# Patient Record
Sex: Male | Born: 1955 | Race: White | Hispanic: No | Smoking: Never smoker
Health system: Southern US, Community
[De-identification: ages and names within clinical notes are randomized; demographics above are authoritative.]

## PROBLEM LIST (undated history)

## (undated) DIAGNOSIS — I213 ST elevation (STEMI) myocardial infarction of unspecified site: Secondary | ICD-10-CM

## (undated) DIAGNOSIS — I519 Heart disease, unspecified: Secondary | ICD-10-CM

## (undated) DIAGNOSIS — I10 Essential (primary) hypertension: Secondary | ICD-10-CM

## (undated) DIAGNOSIS — I2510 Atherosclerotic heart disease of native coronary artery without angina pectoris: Secondary | ICD-10-CM

## (undated) DIAGNOSIS — M1990 Unspecified osteoarthritis, unspecified site: Secondary | ICD-10-CM

## (undated) HISTORY — DX: Atherosclerotic heart disease of native coronary artery without angina pectoris: I25.10

## (undated) HISTORY — PX: JOINT REPLACEMENT: SHX530

## (undated) HISTORY — DX: Heart disease, unspecified: I51.9

## (undated) HISTORY — PX: REPAIR ANKLE LIGAMENT: SUR1187

## (undated) HISTORY — PX: HERNIA REPAIR: SHX51

## (undated) HISTORY — PX: HIP RESURFACING: SHX1760

## (undated) HISTORY — DX: ST elevation (STEMI) myocardial infarction of unspecified site: I21.3

## (undated) HISTORY — PX: TONSILLECTOMY: SUR1361

---

## 2000-09-06 ENCOUNTER — Encounter: Admission: RE | Admit: 2000-09-06 | Discharge: 2000-09-06 | Payer: Self-pay | Admitting: Sports Medicine

## 2000-09-27 ENCOUNTER — Encounter: Admission: RE | Admit: 2000-09-27 | Discharge: 2000-09-27 | Payer: Self-pay | Admitting: Family Medicine

## 2000-09-27 ENCOUNTER — Encounter: Admission: RE | Admit: 2000-09-27 | Discharge: 2000-09-27 | Payer: Self-pay | Admitting: Sports Medicine

## 2002-08-22 ENCOUNTER — Encounter: Admission: RE | Admit: 2002-08-22 | Discharge: 2002-08-22 | Payer: Self-pay | Admitting: Family Medicine

## 2004-10-07 ENCOUNTER — Encounter: Admission: RE | Admit: 2004-10-07 | Discharge: 2004-10-07 | Payer: Self-pay | Admitting: Sports Medicine

## 2004-10-07 ENCOUNTER — Ambulatory Visit: Payer: Self-pay | Admitting: Sports Medicine

## 2004-11-21 ENCOUNTER — Ambulatory Visit: Payer: Self-pay | Admitting: Sports Medicine

## 2005-01-02 ENCOUNTER — Ambulatory Visit: Payer: Self-pay | Admitting: Sports Medicine

## 2005-02-13 ENCOUNTER — Ambulatory Visit: Payer: Self-pay | Admitting: Sports Medicine

## 2005-02-24 ENCOUNTER — Encounter: Admission: RE | Admit: 2005-02-24 | Discharge: 2005-02-24 | Payer: Self-pay | Admitting: Sports Medicine

## 2005-04-27 ENCOUNTER — Encounter: Admission: RE | Admit: 2005-04-27 | Discharge: 2005-04-27 | Payer: Self-pay | Admitting: Orthopedic Surgery

## 2005-07-01 ENCOUNTER — Ambulatory Visit: Payer: Self-pay | Admitting: Family Medicine

## 2005-07-31 ENCOUNTER — Ambulatory Visit: Payer: Self-pay | Admitting: Sports Medicine

## 2005-08-21 ENCOUNTER — Encounter: Admission: RE | Admit: 2005-08-21 | Discharge: 2005-08-21 | Payer: Self-pay | Admitting: Orthopedic Surgery

## 2005-11-05 ENCOUNTER — Ambulatory Visit: Payer: Self-pay | Admitting: Sports Medicine

## 2005-12-10 ENCOUNTER — Ambulatory Visit: Payer: Self-pay | Admitting: Sports Medicine

## 2005-12-14 ENCOUNTER — Encounter: Admission: RE | Admit: 2005-12-14 | Discharge: 2005-12-14 | Payer: Self-pay | Admitting: Orthopedic Surgery

## 2006-06-03 ENCOUNTER — Encounter: Admission: RE | Admit: 2006-06-03 | Discharge: 2006-06-03 | Payer: Self-pay | Admitting: Orthopedic Surgery

## 2006-06-09 IMAGING — CR DG PELVIS 1-2V
1 series · 1 of 1 positions shown · non-contrast
Comparison: none

CLINICAL DATA: Right hip pain for several months, no known injury. 
 PELVIS ONE VIEW: 
 There is no evidence of fracture or diastasis.  No other significant bone or soft tissue abnormalities are identified.

[view not recorded]
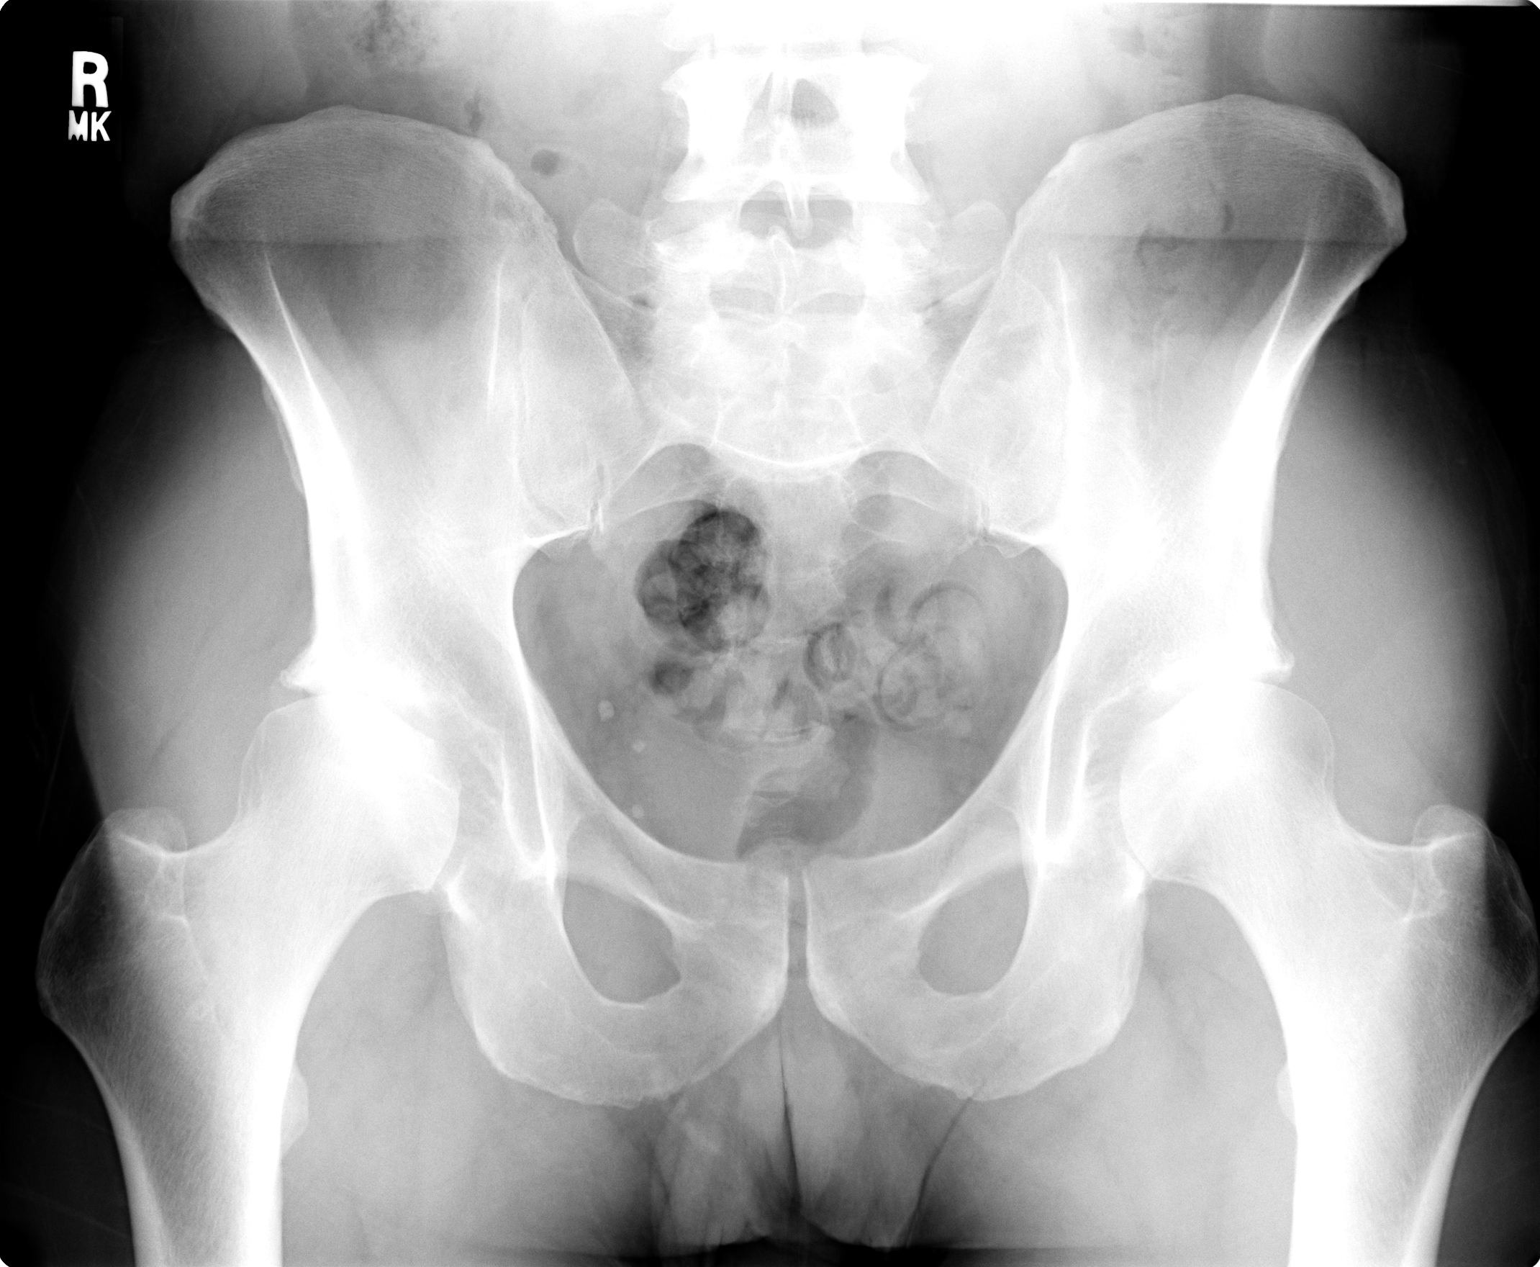

[1 of 1 positions shown; findings below may reference images not displayed]

IMPRESSION: Normal study.

## 2006-10-26 ENCOUNTER — Encounter: Admission: RE | Admit: 2006-10-26 | Discharge: 2006-10-26 | Payer: Self-pay | Admitting: Sports Medicine

## 2006-10-26 ENCOUNTER — Ambulatory Visit: Payer: Self-pay | Admitting: Family Medicine

## 2006-10-26 ENCOUNTER — Ambulatory Visit (HOSPITAL_COMMUNITY): Admission: RE | Admit: 2006-10-26 | Discharge: 2006-10-26 | Payer: Self-pay | Admitting: Family Medicine

## 2007-08-30 ENCOUNTER — Ambulatory Visit: Payer: Self-pay | Admitting: Sports Medicine

## 2007-08-30 DIAGNOSIS — M2170 Unequal limb length (acquired), unspecified site: Secondary | ICD-10-CM | POA: Insufficient documentation

## 2007-08-30 DIAGNOSIS — M79609 Pain in unspecified limb: Secondary | ICD-10-CM | POA: Insufficient documentation

## 2008-02-28 ENCOUNTER — Ambulatory Visit: Payer: Self-pay | Admitting: Sports Medicine

## 2008-04-10 ENCOUNTER — Ambulatory Visit: Payer: Self-pay | Admitting: Sports Medicine

## 2009-04-23 ENCOUNTER — Ambulatory Visit: Payer: Self-pay | Admitting: Sports Medicine

## 2009-04-29 ENCOUNTER — Ambulatory Visit: Payer: Self-pay | Admitting: Sports Medicine

## 2009-04-29 DIAGNOSIS — M1610 Unilateral primary osteoarthritis, unspecified hip: Secondary | ICD-10-CM | POA: Insufficient documentation

## 2009-04-30 ENCOUNTER — Telehealth: Payer: Self-pay | Admitting: Internal Medicine

## 2009-05-07 ENCOUNTER — Ambulatory Visit: Payer: Self-pay | Admitting: Internal Medicine

## 2009-05-16 ENCOUNTER — Ambulatory Visit: Payer: Self-pay | Admitting: Sports Medicine

## 2009-05-16 ENCOUNTER — Ambulatory Visit (HOSPITAL_COMMUNITY): Admission: RE | Admit: 2009-05-16 | Discharge: 2009-05-16 | Payer: Self-pay | Admitting: Sports Medicine

## 2010-10-02 ENCOUNTER — Encounter: Payer: Self-pay | Admitting: Sports Medicine

## 2010-12-14 ENCOUNTER — Encounter: Payer: Self-pay | Admitting: Orthopedic Surgery

## 2013-10-09 ENCOUNTER — Encounter (INDEPENDENT_AMBULATORY_CARE_PROVIDER_SITE_OTHER): Payer: Self-pay

## 2013-10-09 ENCOUNTER — Encounter (INDEPENDENT_AMBULATORY_CARE_PROVIDER_SITE_OTHER): Payer: Self-pay | Admitting: Surgery

## 2013-10-09 ENCOUNTER — Ambulatory Visit (INDEPENDENT_AMBULATORY_CARE_PROVIDER_SITE_OTHER): Payer: 59 | Admitting: Surgery

## 2013-10-09 VITALS — BP 142/84 | HR 60 | Resp 16 | Ht 71.0 in | Wt 188.2 lb

## 2013-10-09 DIAGNOSIS — K4090 Unilateral inguinal hernia, without obstruction or gangrene, not specified as recurrent: Secondary | ICD-10-CM | POA: Insufficient documentation

## 2013-10-09 NOTE — Progress Notes (Signed)
Patient ID: Bradley [name], male   DOB: 03-18-1956, 57 y.o.   MRN: [id number]  Chief Complaint  Patient presents with  . New Evaluation    eval groin pain / possible hernia    HPI Bradley [name] is a 57 y.o. male.   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 He comes in for evaluation of a right inguinal hernia. He noticed a bulge in his right groin after doing heavy lifting this past summer. It is now becoming larger and caused a mild amount of discomfort. He had a previous left inguinal hernia repaired over 30 years ago. He has had no obstructive symptoms. He is otherwise healthy and without complaints History reviewed. No pertinent past medical history.  Past Surgical History  Procedure Laterality Date  . Repair ankle ligament    . Hernia repair      Family History  Problem Relation Age of Onset  . Heart disease Mother   . Cancer Mother     breast  . Heart disease Father     Social History History  Substance Use Topics  . Smoking status: Never Smoker   . Smokeless tobacco: Never Used  . Alcohol Use: Yes     Comment: occ    No Known Allergies  No current outpatient prescriptions on file.   No current facility-administered medications for this visit.    Review of Systems Review of Systems  Constitutional: Negative for fever, chills and unexpected weight change.  HENT: Negative for congestion, hearing loss, sore throat, trouble swallowing and voice change.   Eyes: Negative for visual disturbance.  Respiratory: Negative for cough and wheezing.   Cardiovascular: Negative for chest pain, palpitations and leg swelling.  Gastrointestinal: Negative for nausea, vomiting, abdominal pain, diarrhea, constipation, blood in stool, abdominal distention, anal bleeding and rectal pain.  Genitourinary: Negative for hematuria and difficulty urinating.  Musculoskeletal: Negative for arthralgias.  Skin: Negative for rash and wound.  Neurological: Negative for seizures,  syncope, weakness and headaches.  Hematological: Negative for adenopathy. Does not bruise/bleed easily.  Psychiatric/Behavioral: Negative for confusion.    Blood pressure 142/84, pulse 60, resp. rate 16, height 5\' 11"  (1.803 m), weight 188 lb 3.2 oz (85.367 kg).  Physical Exam Physical Exam  Constitutional: He is oriented to person, place, and time. He appears well-developed and well-nourished. No distress.  HENT:  Head: Normocephalic and atraumatic.  Right Ear: External ear normal.  Left Ear: External ear normal.  Nose: Nose normal.  Mouth/Throat: Oropharynx is clear and moist. No oropharyngeal exudate.  Eyes: Conjunctivae are normal. Pupils are equal, round, and reactive to light. Right eye exhibits no discharge. Left eye exhibits no discharge. No scleral icterus.  Neck: Normal range of motion. Neck supple. No tracheal deviation present. No thyromegaly present.  Cardiovascular: Normal rate, regular rhythm, normal heart sounds and intact distal pulses.   No murmur heard. Pulmonary/Chest: Effort normal and breath sounds normal. No respiratory distress. He has no wheezes.  Abdominal: Soft. Bowel sounds are normal. He exhibits no distension. There is no tenderness. There is no rebound.  Easily reducible right inguinal hernia without evidence of left inguinal hernia  Musculoskeletal: Normal range of motion. He exhibits no edema and no tenderness.  Lymphadenopathy:    He has no cervical adenopathy.  Neurological: He is alert and oriented to person, place, and time.  Skin: Skin is warm and dry. No rash noted. He is not diaphoretic. No erythema.  Psychiatric: His behavior is normal. Judgment  normal.    Data Reviewed    Assessment    Right inguinal hernia     Plan    As it is moderate in size and symptomatic, repair with mesh was recommended. I discussed both the laparoscopic and open techniques. I discussed the risk of surgery which includes but is not limited to bleeding,  infection, recurrence, damage to surrounding structures, nerve entrapment, chronic pain, et Karie Soda. I also discussed postoperative recovery. He understands and wishes to proceed.        Anajah Sterbenz A 10/09/2013, 2:41 PM

## 2013-10-10 ENCOUNTER — Encounter (HOSPITAL_COMMUNITY): Payer: Self-pay

## 2013-10-14 NOTE — Pre-Procedure Instructions (Addendum)
Bradley [name]  10/14/2013   Your procedure is scheduled on:  December 1st, Monday   Report to [facility] Entrance "A" [address] at [employer] AM.   Call this number if you have problems the morning of surgery: [phone]   Remember:   Do not eat food or drink liquids after midnight Sunday.   Take these medicines the morning of surgery with A SIP OF WATER: None     STOP/ Do not take Aspirin, Aleve, Naproxen, Advil, Ibuprofen, Vitamin, Herbs, and Supplements starting November 24   Do not wear jewelry.  Do not wear lotions,powers, or cologne.  May wear deodorant.    Men may shave face and neck.  Do not bring valuables to the hospital.  [facility] is not responsible or any belongings or valuables.               Contacts, dentures or bridgework may not be worn into surgery.  Leave suitcase in the car. After surgery it may be brought to your room.  For patients admitted to the hospital, discharge time is determined by your treatment team.               Patients discharged the day of surgery will not be allowed to drive home.  You will need a responsible adult stay with you for 24 hrs afterwards.    Name and phone number of your driver:  SUE  HUNT    SPOUSE   [phone]   Special Instructions: Shower using CHG 2 nights before surgery and the night before surgery.  If you shower the day of surgery use CHG.  Use special wash - you have one bottle of CHG for all showers.  You should use approximately 1/3 of the bottle for each shower.   Please read over the following fact sheets that you were given: Pain Booklet, Coughing and Deep Breathing and Surgical Site Infection Prevention

## 2013-10-16 ENCOUNTER — Encounter (HOSPITAL_COMMUNITY)
Admission: RE | Admit: 2013-10-16 | Discharge: 2013-10-16 | Disposition: A | Discharge status: Home / Self Care | Payer: 59 | Source: Ambulatory Visit | Attending: Surgery | Admitting: Surgery

## 2013-10-16 ENCOUNTER — Encounter (HOSPITAL_COMMUNITY): Payer: Self-pay

## 2013-10-16 DIAGNOSIS — Z01812 Encounter for preprocedural laboratory examination: Secondary | ICD-10-CM | POA: Insufficient documentation

## 2013-10-16 DIAGNOSIS — Z01818 Encounter for other preprocedural examination: Secondary | ICD-10-CM | POA: Insufficient documentation

## 2013-10-16 LAB — BASIC METABOLIC PANEL
BUN: 14 mg/dL (ref 6–23)
CO2: 24 mEq/L (ref 19–32)
Calcium: 9.1 mg/dL (ref 8.4–10.5)
Chloride: 102 mEq/L (ref 96–112)
Creatinine, Ser: 1 mg/dL (ref 0.50–1.35)
GFR calc Af Amer: >90 mL/min (ref >90)
GFR calc non Af Amer: 80 mL/min — ABNORMAL LOW (ref >90)
Glucose, Bld: 105 mg/dL — ABNORMAL HIGH (ref 70–99)
Potassium: 3.8 mEq/L (ref 3.5–5.1)
Sodium: 137 mEq/L (ref 135–145)

## 2013-10-16 LAB — CBC
HCT: 42.7 % (ref 39.0–52.0)
Hemoglobin: 14.6 g/dL (ref 13.0–17.0)
MCH: 32 pg (ref 26.0–34.0)
MCHC: 34.2 g/dL (ref 30.0–36.0)
MCV: 93.6 fL (ref 78.0–100.0)
Platelets: 172 10*3/uL (ref 150–400)
RBC: 4.56 MIL/uL (ref 4.22–5.81)
RDW: 13.9 % (ref 11.5–15.5)
WBC: 6.8 10*3/uL (ref 4.0–10.5)

## 2013-10-22 MED ORDER — CEFAZOLIN SODIUM-DEXTROSE 2-3 GM-% IV SOLR
2.0000 g | INTRAVENOUS | Status: AC
  Administered 2013-10-23: 2 g via INTRAVENOUS
  Filled 2013-10-22: qty 50

## 2013-10-23 ENCOUNTER — Encounter (HOSPITAL_COMMUNITY): Payer: 59 | Admitting: Anesthesiology

## 2013-10-23 ENCOUNTER — Encounter (HOSPITAL_COMMUNITY)
Admission: RE | Disposition: A | Discharge status: Home / Self Care | Payer: Self-pay | Source: Ambulatory Visit | Attending: Surgery

## 2013-10-23 ENCOUNTER — Encounter (HOSPITAL_COMMUNITY): Payer: Self-pay | Admitting: *Deleted

## 2013-10-23 ENCOUNTER — Ambulatory Visit (HOSPITAL_COMMUNITY): Payer: 59 | Admitting: Anesthesiology

## 2013-10-23 ENCOUNTER — Ambulatory Visit (HOSPITAL_COMMUNITY)
Admission: RE | Admit: 2013-10-23 | Discharge: 2013-10-23 | Disposition: A | Discharge status: Home / Self Care | Payer: 59 | Source: Ambulatory Visit | Attending: Surgery | Admitting: Surgery

## 2013-10-23 DIAGNOSIS — K4090 Unilateral inguinal hernia, without obstruction or gangrene, not specified as recurrent: Secondary | ICD-10-CM | POA: Insufficient documentation

## 2013-10-23 HISTORY — PX: INSERTION OF MESH: SHX5868

## 2013-10-23 HISTORY — PX: INGUINAL HERNIA REPAIR: SHX194

## 2013-10-23 SURGERY — REPAIR, HERNIA, INGUINAL, ADULT
Anesthesia: General | Laterality: Right | Wound class: Clean

## 2013-10-23 MED ORDER — FENTANYL CITRATE 0.05 MG/ML IJ SOLN
INTRAMUSCULAR | Status: DC | PRN
  Administered 2013-10-23: 150 ug via INTRAVENOUS

## 2013-10-23 MED ORDER — KETOROLAC TROMETHAMINE 30 MG/ML IJ SOLN
INTRAMUSCULAR | Status: DC | PRN
  Administered 2013-10-23: 30 mg via INTRAVENOUS

## 2013-10-23 MED ORDER — HYDROMORPHONE HCL PF 1 MG/ML IJ SOLN: 0.2500 mg | INTRAMUSCULAR | Status: DC | PRN

## 2013-10-23 MED ORDER — ONDANSETRON HCL 4 MG/2ML IJ SOLN
INTRAMUSCULAR | Status: DC | PRN
  Administered 2013-10-23: 4 mg via INTRAVENOUS

## 2013-10-23 MED ORDER — MIDAZOLAM HCL 5 MG/5ML IJ SOLN
INTRAMUSCULAR | Status: DC | PRN
  Administered 2013-10-23: 2 mg via INTRAVENOUS

## 2013-10-23 MED ORDER — ONDANSETRON HCL 4 MG/2ML IJ SOLN: 4.0000 mg | Freq: Once | INTRAMUSCULAR | Status: DC | PRN

## 2013-10-23 MED ORDER — PROPOFOL 10 MG/ML IV BOLUS
INTRAVENOUS | Status: DC | PRN
  Administered 2013-10-23: 200 mg via INTRAVENOUS

## 2013-10-23 MED ORDER — 0.9 % SODIUM CHLORIDE (POUR BTL) OPTIME
TOPICAL | Status: DC | PRN
  Administered 2013-10-23: 1000 mL

## 2013-10-23 MED ORDER — BUPIVACAINE-EPINEPHRINE 0.5% -1:200000 IJ SOLN
INTRAMUSCULAR | Status: DC | PRN
  Administered 2013-10-23: 20 mL

## 2013-10-23 MED ORDER — BUPIVACAINE-EPINEPHRINE PF 0.5-1:200000 % IJ SOLN
INTRAMUSCULAR | Status: AC
  Filled 2013-10-23: qty 30

## 2013-10-23 MED ORDER — KETOROLAC TROMETHAMINE 30 MG/ML IJ SOLN: 15.0000 mg | Freq: Once | INTRAMUSCULAR | Status: DC | PRN

## 2013-10-23 MED ORDER — HYDROCODONE-ACETAMINOPHEN 5-325 MG PO TABS: 1.0000 | ORAL_TABLET | ORAL | Status: DC | PRN

## 2013-10-23 MED ORDER — LACTATED RINGERS IV SOLN
INTRAVENOUS | Status: DC | PRN
  Administered 2013-10-23 (×2): via INTRAVENOUS

## 2013-10-23 SURGICAL SUPPLY — 45 items
APL SKNCLS STERI-STRIP NONHPOA (GAUZE/BANDAGES/DRESSINGS) ×1
BENZOIN TINCTURE PRP APPL 2/3 (GAUZE/BANDAGES/DRESSINGS) ×2 IMPLANT
BLADE SURG 10 STRL SS (BLADE) ×2 IMPLANT
BLADE SURG 15 STRL LF DISP TIS (BLADE) ×1 IMPLANT
BLADE SURG 15 STRL SS (BLADE) ×2
BLADE SURG ROTATE 9660 (MISCELLANEOUS) ×1 IMPLANT
CHLORAPREP W/TINT 26ML (MISCELLANEOUS) ×2 IMPLANT
CLSR STERI-STRIP ANTIMIC 1/2X4 (GAUZE/BANDAGES/DRESSINGS) ×1 IMPLANT
COVER SURGICAL LIGHT HANDLE (MISCELLANEOUS) ×2 IMPLANT
DRAIN PENROSE 1/2X12 LTX STRL (WOUND CARE) ×1 IMPLANT
DRAPE LAPAROTOMY TRNSV 102X78 (DRAPE) ×2 IMPLANT
DRESSING TELFA 8X3 (GAUZE/BANDAGES/DRESSINGS) ×2 IMPLANT
DRSG TEGADERM 4X4.75 (GAUZE/BANDAGES/DRESSINGS) ×2 IMPLANT
ELECT CAUTERY BLADE 6.4 (BLADE) ×2 IMPLANT
ELECT REM PT RETURN 9FT ADLT (ELECTROSURGICAL) ×2
ELECTRODE REM PT RTRN 9FT ADLT (ELECTROSURGICAL) ×1 IMPLANT
GLOVE BIOGEL PI IND STRL 6.5 (GLOVE) IMPLANT
GLOVE BIOGEL PI IND STRL 7.0 (GLOVE) IMPLANT
GLOVE BIOGEL PI INDICATOR 6.5 (GLOVE) ×2
GLOVE BIOGEL PI INDICATOR 7.0 (GLOVE) ×1
GLOVE SURG SIGNA 7.5 PF LTX (GLOVE) ×2 IMPLANT
GOWN STRL NON-REIN LRG LVL3 (GOWN DISPOSABLE) ×2 IMPLANT
GOWN STRL REIN XL XLG (GOWN DISPOSABLE) ×2 IMPLANT
KIT BASIN OR (CUSTOM PROCEDURE TRAY) ×2 IMPLANT
KIT ROOM TURNOVER OR (KITS) ×2 IMPLANT
MESH PARIETEX PROGRIP RIGHT (Mesh General) ×1 IMPLANT
NDL HYPO 25GX1X1/2 BEV (NEEDLE) ×1 IMPLANT
NEEDLE HYPO 25GX1X1/2 BEV (NEEDLE) ×2 IMPLANT
NS IRRIG 1000ML POUR BTL (IV SOLUTION) ×2 IMPLANT
PACK SURGICAL SETUP 50X90 (CUSTOM PROCEDURE TRAY) ×2 IMPLANT
PAD ARMBOARD 7.5X6 YLW CONV (MISCELLANEOUS) ×4 IMPLANT
PENCIL BUTTON HOLSTER BLD 10FT (ELECTRODE) ×2 IMPLANT
SPECIMEN JAR SMALL (MISCELLANEOUS) IMPLANT
SPONGE GAUZE 4X4 12PLY (GAUZE/BANDAGES/DRESSINGS) ×1 IMPLANT
SPONGE LAP 18X18 X RAY DECT (DISPOSABLE) ×2 IMPLANT
STRIP CLOSURE SKIN 1/2X4 (GAUZE/BANDAGES/DRESSINGS) ×2 IMPLANT
SUT MON AB 4-0 PC3 18 (SUTURE) ×2 IMPLANT
SUT SILK 2 0 SH (SUTURE) ×1 IMPLANT
SUT VIC AB 2-0 CT1 27 (SUTURE) ×4
SUT VIC AB 2-0 CT1 TAPERPNT 27 (SUTURE) ×2 IMPLANT
SUT VIC AB 3-0 CT1 27 (SUTURE) ×2
SUT VIC AB 3-0 CT1 TAPERPNT 27 (SUTURE) ×1 IMPLANT
SYR CONTROL 10ML LL (SYRINGE) ×2 IMPLANT
TOWEL OR 17X24 6PK STRL BLUE (TOWEL DISPOSABLE) ×2 IMPLANT
TOWEL OR 17X26 10 PK STRL BLUE (TOWEL DISPOSABLE) ×2 IMPLANT

## 2013-10-23 NOTE — Anesthesia Procedure Notes (Signed)
Procedure Name: LMA Insertion Date/Time: 10/23/2013 7:25 AM Performed by: Coralee Rud Pre-anesthesia Checklist: Patient identified Patient Re-evaluated:Patient Re-evaluated prior to inductionOxygen Delivery Method: Circle system utilized Preoxygenation: Pre-oxygenation with 100% oxygen Intubation Type: IV induction Ventilation: Mask ventilation without difficulty LMA: LMA inserted LMA Size: 5.0 Grade View: Grade I Number of attempts: 1

## 2013-10-23 NOTE — Op Note (Signed)
[name], CHING NO.:  [id number]  MEDICAL RECORD NO.:  [id number]  LOCATION:  [facility]                         FACILITY:  [facility]  PHYSICIAN:  Abigail Miyamoto, M.D. DATE OF BIRTH:  [birth year]  DATE OF PROCEDURE:  10/23/2013 DATE OF DISCHARGE:                              OPERATIVE REPORT   PREOPERATIVE DIAGNOSIS:  Right inguinal hernia.  POSTOPERATIVE DIAGNOSIS:  Right inguinal hernia.  PROCEDURE:  Right inguinal hernia repair with mesh.  SURGEON:  Abigail Miyamoto, M.D.  ANESTHESIA:  General and 0.5% Marcaine.  ESTIMATED BLOOD LOSS:  Minimal.  FINDINGS:  The patient was found to have an indirect right inguinal hernia.  It was repaired with a piece of Parietex ProGrip Prolene mesh.  PROCEDURE IN DETAIL:  The patient was brought to the operating room, identified as Lars Pinks.  He was placed supine on the operating room table and general anesthesia was induced.  His abdomen and groins were then prepped and draped in usual sterile fashion.  I performed a right ilioinguinal nerve block with Marcaine.  I anesthetized the skin as well with Marcaine.  I then made a longitudinal incision in the patient's right groin with a scalpel.  I took this down through Scarpa's fascia with the electrocautery.  The external oblique fascia was then identified and opened towards the internal and external rings.  The ilioinguinal nerve was identified and divided.  I removed a segment of the nerve.  I then controlled the testicular cord structures with a Penrose drain.  The inguinal floor was examined, and there was no evidence of direct hernia.  The patient had a large thick-walled hernia sac along the testicular cord.  I was able to separate the cord from the hernia sac.  I then dissected down the sac to the base at the internal ring.  I then tied off the base of the sac with a 2-0 silk suture.  I then excised the redundant sac with the electrocautery.  A piece  of Parietex ProGrip Prolene mesh for the right side was then brought to the field.  I placed it as an onlay on the inguinal floor.  I then brought it around the cord structures.  I then secured it in place with several interrupted 2-0 Vicryl sutures.  Good coverage of the inguinal floor and cord structures appeared to be achieved.  I then closed the external oblique fascia over top of this with running 2-0 Vicryl suture.  I anesthetized the fascia further with Marcaine.  I then closed the Scarpa's fascia with interrupted 3-0 Vicryl sutures and closed the skin with a running 4-0 Monocryl.  Steri-Strips, gauze, and Tegaderm were then applied.  The patient tolerated the procedure well.  All the counts were correct at the end of the procedure.  The patient was then extubated in the operating room and taken in a stable condition to the recovery room.     Abigail Miyamoto, M.D.     DB/MEDQ  D:  10/23/2013  T:  10/23/2013  Job:  [id number]

## 2013-10-23 NOTE — Preoperative (Signed)
Beta Blockers   Reason not to administer Beta Blockers:Not Applicable 

## 2013-10-23 NOTE — Transfer of Care (Signed)
Immediate Anesthesia Transfer of Care Note  Patient: Bradley [name]  Procedure(s) Performed: Procedure(s): HERNIA REPAIR INGUINAL ADULT (Right) INSERTION OF MESH (Right)  Patient Location: PACU  Anesthesia Type:General  Level of Consciousness: awake, oriented and patient cooperative  Airway & Oxygen Therapy: Patient Spontanous Breathing and Patient connected to face mask oxygen  Post-op Assessment: Report given to PACU RN and Post -op Vital signs reviewed and stable  Post vital signs: Reviewed and stable  Complications: No apparent anesthesia complications

## 2013-10-23 NOTE — H&P (Signed)
Patient ID: Bradley [name], male DOB: [birth year], 57 y.o. MRN: [id number]  Chief Complaint   Patient presents with   .  New Evaluation     eval groin pain / possible hernia   HPI  Bradley [name] is a 57 y.o. male.  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 He comes in for evaluation of a right inguinal hernia. He noticed a bulge in his right groin after doing heavy lifting this past summer. It is now becoming larger and caused a mild amount of discomfort. He had a previous left inguinal hernia repaired over 30 years ago. He has had no obstructive symptoms. He is otherwise healthy and without complaints  History reviewed. No pertinent past medical history.  Past Surgical History   Procedure  Laterality  Date   .  Repair ankle ligament     .  Hernia repair      Family History   Problem  Relation  Age of Onset   .  Heart disease  Mother    .  Cancer  Mother      breast   .  Heart disease  Father    Social History  History   Substance Use Topics   .  Smoking status:  Never Smoker   .  Smokeless tobacco:  Never Used   .  Alcohol Use:  Yes      Comment: occ   No Known Allergies  No current outpatient prescriptions on file.    No current facility-administered medications for this visit.   Review of Systems  Review of Systems  Constitutional: Negative for fever, chills and unexpected weight change.  HENT: Negative for congestion, hearing loss, sore throat, trouble swallowing and voice change.  Eyes: Negative for visual disturbance.  Respiratory: Negative for cough and wheezing.  Cardiovascular: Negative for chest pain, palpitations and leg swelling.  Gastrointestinal: Negative for nausea, vomiting, abdominal pain, diarrhea, constipation, blood in stool, abdominal distention, anal bleeding and rectal pain.  Genitourinary: Negative for hematuria and difficulty urinating.  Musculoskeletal: Negative for arthralgias.  Skin: Negative for rash and wound.  Neurological:  Negative for seizures, syncope, weakness and headaches.  Hematological: Negative for adenopathy. Does not bruise/bleed easily.  Psychiatric/Behavioral: Negative for confusion.  Blood pressure 142/84, pulse 60, resp. rate 16, height 5\' 11"  (1.803 m), weight 188 lb 3.2 oz (85.367 kg).  Physical Exam  Physical Exam  Constitutional: He is oriented to person, place, and time. He appears well-developed and well-nourished. No distress.  HENT:  Head: Normocephalic and atraumatic.  Right Ear: External ear normal.  Left Ear: External ear normal.  Nose: Nose normal.  Mouth/Throat: Oropharynx is clear and moist. No oropharyngeal exudate.  Eyes: Conjunctivae are normal. Pupils are equal, round, and reactive to light. Right eye exhibits no discharge. Left eye exhibits no discharge. No scleral icterus.  Neck: Normal range of motion. Neck supple. No tracheal deviation present. No thyromegaly present.  Cardiovascular: Normal rate, regular rhythm, normal heart sounds and intact distal pulses.  No murmur heard.  Pulmonary/Chest: Effort normal and breath sounds normal. No respiratory distress. He has no wheezes.  Abdominal: Soft. Bowel sounds are normal. He exhibits no distension. There is no tenderness. There is no rebound.  Easily reducible right inguinal hernia without evidence of left inguinal hernia  Musculoskeletal: Normal range of motion. He exhibits no edema and no tenderness.  Lymphadenopathy:  He has no cervical adenopathy.  Neurological: He is alert and oriented to person,  place, and time.  Skin: Skin is warm and dry. No rash noted. He is not diaphoretic. No erythema.  Psychiatric: His behavior is normal. Judgment normal.   Assessment  Right inguinal hernia   Plan  As it is moderate in size and symptomatic, repair with mesh was recommended. I discussed both the laparoscopic and open techniques. I discussed the risk of surgery which includes but is not limited to bleeding, infection, recurrence,  damage to surrounding structures, nerve entrapment, chronic pain, et Karie Soda. I also discussed postoperative recovery. He understands and wishes to proceed.

## 2013-10-23 NOTE — Op Note (Signed)
HERNIA REPAIR INGUINAL ADULT, INSERTION OF MESH  Procedure Note  Bradley [name] 10/23/2013   Pre-op Diagnosis: right inguinal hernia      Post-op Diagnosis: same  Procedure(s): HERNIA REPAIR INGUINAL ADULT RIGHT INSERTION OF MESH  Surgeon(s): Shelly Rubenstein, MD  Anesthesia: General  Staff:  Circulator: Gunnar Fusi, RN; Jacqlyn Larsen, RN Scrub Person: [facility] Sparkman, CST  Estimated Blood Loss: Minimal                         Bradley [name]   Date: 10/23/2013  Time: 8:03 AM

## 2013-10-23 NOTE — Anesthesia Preprocedure Evaluation (Signed)
Anesthesia Evaluation  Patient identified by MRN, date of birth, ID band Patient awake    Reviewed: Allergy & Precautions, H&P , NPO status   History of Anesthesia Complications Negative for: history of anesthetic complications  Airway Mallampati: I      Dental  (+) Teeth Intact   Pulmonary neg pulmonary ROS,    Pulmonary exam normal       Cardiovascular negative cardio ROS  Rhythm:Regular Rate:Bradycardia     Neuro/Psych negative neurological ROS  negative psych ROS   GI/Hepatic negative GI ROS, Neg liver ROS,   Endo/Other  negative endocrine ROS  Renal/GU negative Renal ROS     Musculoskeletal negative musculoskeletal ROS (+)   Abdominal Normal abdominal exam  (+)   Peds negative pediatric ROS (+)  Hematology   Anesthesia Other Findings   Reproductive/Obstetrics negative OB ROS                           Anesthesia Physical Anesthesia Plan  ASA: I  Anesthesia Plan: General   Post-op Pain Management:    Induction:   Airway Management Planned: LMA  Additional Equipment:   Intra-op Plan:   Post-operative Plan: Extubation in OR  Informed Consent: I have reviewed the patients History and Physical, chart, labs and discussed the procedure including the risks, benefits and alternatives for the proposed anesthesia with the patient or authorized representative who has indicated his/her understanding and acceptance.   Dental advisory given  Plan Discussed with: CRNA and Surgeon  Anesthesia Plan Comments:         Anesthesia Quick Evaluation

## 2013-10-23 NOTE — Anesthesia Postprocedure Evaluation (Signed)
  Anesthesia Post-op Note  Patient: Bradley [name]  Procedure(s) Performed: Procedure(s): HERNIA REPAIR INGUINAL ADULT (Right) INSERTION OF MESH (Right)  Patient Location: PACU  Anesthesia Type:General  Level of Consciousness: awake, alert  and oriented  Airway and Oxygen Therapy: Patient Spontanous Breathing  Post-op Pain: mild  Post-op Assessment: Post-op Vital signs reviewed, Patient's Cardiovascular Status Stable, Respiratory Function Stable, Patent Airway and Pain level controlled  Post-op Vital Signs: stable  Complications: No apparent anesthesia complications

## 2013-10-25 ENCOUNTER — Encounter (HOSPITAL_COMMUNITY): Payer: Self-pay | Admitting: Surgery

## 2013-11-07 ENCOUNTER — Ambulatory Visit (INDEPENDENT_AMBULATORY_CARE_PROVIDER_SITE_OTHER): Payer: 59 | Admitting: Surgery

## 2013-11-07 ENCOUNTER — Encounter (INDEPENDENT_AMBULATORY_CARE_PROVIDER_SITE_OTHER): Payer: Self-pay | Admitting: Surgery

## 2013-11-07 VITALS — BP 116/80 | HR 60 | Temp 98.0°F | Resp 14 | Ht 71.0 in | Wt 190.4 lb

## 2013-11-07 DIAGNOSIS — Z09 Encounter for follow-up examination after completed treatment for conditions other than malignant neoplasm: Secondary | ICD-10-CM

## 2013-11-07 NOTE — Progress Notes (Signed)
Subjective:     Patient ID: Bradley [name], male   DOB: 05-02-1956, 57 y.o.   MRN: [id number]  HPI He is here for his first postop visit status post right inguinal hernia repair with mesh. He is doing well has no complaints.  Review of Systems     Objective:   Physical Exam On exam, his incision is healing well with no evidence of infection or recurrence    Assessment:     Patient stable postop     Plan:     He will refrain from heavy lifting until December 29. At that time he resumed normal activity. I will see him back as needed

## 2015-04-15 ENCOUNTER — Ambulatory Visit (INDEPENDENT_AMBULATORY_CARE_PROVIDER_SITE_OTHER): Payer: No Typology Code available for payment source | Admitting: Family Medicine

## 2015-04-15 ENCOUNTER — Encounter: Payer: Self-pay | Admitting: Family Medicine

## 2015-04-15 VITALS — BP 123/77 | HR 49 | Temp 98.0°F | Resp 16 | Ht 70.5 in | Wt 183.0 lb

## 2015-04-15 DIAGNOSIS — Z23 Encounter for immunization: Secondary | ICD-10-CM | POA: Diagnosis not present

## 2015-04-15 DIAGNOSIS — Z0000 Encounter for general adult medical examination without abnormal findings: Secondary | ICD-10-CM

## 2015-04-15 DIAGNOSIS — E785 Hyperlipidemia, unspecified: Secondary | ICD-10-CM | POA: Diagnosis not present

## 2015-04-15 DIAGNOSIS — Z1211 Encounter for screening for malignant neoplasm of colon: Secondary | ICD-10-CM | POA: Diagnosis not present

## 2015-04-15 DIAGNOSIS — Z8249 Family history of ischemic heart disease and other diseases of the circulatory system: Secondary | ICD-10-CM | POA: Diagnosis not present

## 2015-04-15 MED ORDER — ZOSTER VACCINE LIVE 19400 UNT/0.65ML ~~LOC~~ SOLR: 0.6500 mL | Freq: Once | SUBCUTANEOUS | Status: DC

## 2015-04-15 NOTE — Patient Instructions (Signed)
I will refer you to gastroenterologist and cardiologist as we discussed.  You may want to obtain an appointment with Dr. Ninfa Linden to evaluate the left sided area for possible hernia.  Aspirin 81mg  each day.  If you decide you would like prostate cancer screening - we can perform that at next visit.  I printed shingles vaccine for your pharmacy if you would like to have this vaccine. See info below.     Keeping you healthy  Get these tests  Blood pressure- Have your blood pressure checked once a year by your healthcare provider.  Normal blood pressure is 120/80  Weight- Have your body mass index (BMI) calculated to screen for obesity.  BMI is a measure of body fat based on height and weight. You can also calculate your own BMI at [email or web address].  Cholesterol- Have your cholesterol checked every year.  Diabetes- Have your blood sugar checked regularly if you have high blood pressure, high cholesterol, have a family history of diabetes or if you are overweight.  Screening for Colon Cancer- Colonoscopy starting at age 21.  Screening may begin sooner depending on your family history and other health conditions. Follow up colonoscopy as directed by your Gastroenterologist.  Screening for Prostate Cancer- Both blood work (PSA) and a rectal exam help screen for Prostate Cancer.  Screening begins at age 67 with African-American men and at age 69 with Caucasian men.  Screening may begin sooner depending on your family history.  Take these medicines  Aspirin- One aspirin daily can help prevent Heart disease and Stroke.  Flu shot- Every fall.  Tetanus- Every 10 years.  Zostavax- Once after the age of 36 to prevent Shingles.  Pneumonia shot- Once after the age of 75; if you are younger than 41, ask your healthcare provider if you need a Pneumonia shot.  Take these steps  Don't smoke- If you do smoke, talk to your doctor about quitting.  For tips on how to quit, go to  www.smokefree.gov or call 1-800-QUIT-NOW.  Be physically active- Exercise 5 days a week for at least 30 minutes.  If you are not already physically active start slow and gradually work up to 30 minutes of moderate physical activity.  Examples of moderate activity include walking briskly, mowing the yard, dancing, swimming, bicycling, etc.  Eat a healthy diet- Eat a variety of healthy food such as fruits, vegetables, low fat milk, low fat cheese, yogurt, lean meant, poultry, fish, beans, tofu, etc. For more information go to www.thenutritionsource.org  Drink alcohol in moderation- Limit alcohol intake to less than two drinks a day. Never drink and drive.  Dentist- Brush and floss twice daily; visit your dentist twice a year.  Depression- Your emotional health is as important as your physical health. If you're feeling down, or losing interest in things you would normally enjoy please talk to your healthcare provider.  Eye exam- Visit your eye doctor every year.  Safe sex- If you may be exposed to a sexually transmitted infection, use a condom.  Seat belts- Seat belts can save your life; always wear one.  Smoke/Carbon Monoxide detectors- These detectors need to be installed on the appropriate level of your home.  Replace batteries at least once a year.  Skin cancer- When out in the sun, cover up and use sunscreen 15 SPF or higher.  Violence- If anyone is threatening you, please tell your healthcare provider.  Living Will/ Health care power of attorney- Speak with your healthcare provider and family.  Shingles  Vaccine What You Need to Know WHAT IS SHINGLES?  Shingles is a painful skin rash, often with blisters. It is also called Herpes Zoster or just Zoster.  A shingles rash usually appears on one side of the face or body and lasts from 2 to 4 weeks. Its main symptom is pain, which can be quite severe. Other symptoms of shingles can include fever, headache, chills, and upset stomach. Very  rarely, a shingles infection can lead to pneumonia, hearing problems, blindness, brain inflammation (encephalitis), or death.  For about 1 person in 5, severe pain can continue even after the rash clears up. This is called post-herpetic neuralgia.  Shingles is caused by the Varicella Zoster virus. This is the same virus that causes chickenpox. Only someone who has had a case of chickenpox or rarely, has gotten chickenpox vaccine, can get shingles. The virus stays in your body. It can reappear many years later to cause a case of shingles.  You cannot catch shingles from another person with shingles. However, a person who has never had chickenpox (or chickenpox vaccine) could get chickenpox from someone with shingles. This is not very common.  Shingles is far more common in people 45 and older than in younger people. It is also more common in people whose immune systems are weakened because of a disease such as cancer or drugs such as steroids or chemotherapy.  At least 1 million people get shingles per year in the [place]. SHINGLES VACCINE  A vaccine for shingles was licensed in 9244. In clinical trials, the vaccine reduced the risk of shingles by 50%. It can also reduce the pain in people who still get shingles after being vaccinated.  A single dose of shingles vaccine is recommended for adults 32 years of age and older. SOME PEOPLE SHOULD NOT GET SHINGLES VACCINE OR SHOULD WAIT A person should not get shingles vaccine if he or she:  Has ever had a life-threatening allergic reaction to gelatin, the antibiotic neomycin, or any other component of shingles vaccine. Tell your caregiver if you have any severe allergies.  Has a weakened immune system because of current:  AIDS or another disease that affects the immune system.  Treatment with drugs that affect the immune system, such as prolonged use of high-dose steroids.  Cancer treatment, such as radiation or chemotherapy.  Cancer  affecting the bone marrow or lymphatic system, such as leukemia or lymphoma.  Is pregnant, or might be pregnant. Women should not become pregnant until at least 4 weeks after getting shingles vaccine. Someone with a minor illness, such as a cold, may be vaccinated. Anyone with a moderate or severe acute illness should usually wait until he or she recovers before getting the vaccine. This includes anyone with a temperature of 101.3 F (38 C) or higher. WHAT ARE THE RISKS FROM SHINGLES VACCINE?  A vaccine, like any medicine, could possibly cause serious problems, such as severe allergic reactions. However, the risk of a vaccine causing serious harm, or death, is extremely small.  No serious problems have been identified with shingles vaccine. Mild Problems  Redness, soreness, swelling, or itching at the site of the injection (about 1 person in 3).  Headache (about 1 person in 41). Like all vaccines, shingles vaccine is being closely monitored for unusual or severe problems. WHAT IF THERE IS A MODERATE OR SEVERE REACTION? What should I look for? Any unusual condition, such as a severe allergic reaction or a high fever. If a severe allergic reaction  occurred, it would be within a few minutes to an hour after the shot. Signs of a serious allergic reaction can include difficulty breathing, weakness, hoarseness or wheezing, a fast heartbeat, hives, dizziness, paleness, or swelling of the throat. What should I do?  Call your caregiver, or get the person to a caregiver right away.  Tell the caregiver what happened, the date and time it happened, and when the vaccination was given.  Ask the caregiver to report the reaction by filing a Vaccine Adverse Event Reporting System (VAERS) form. Or, you can file this report through the VAERS web site at www.vaers.SamedayNews.es or by calling (629)056-0862. VAERS does not provide medical advice. HOW CAN I LEARN MORE?  Ask your caregiver. He or she can give you  the vaccine package insert or suggest other sources of information.  Contact the Centers for Disease Control and Prevention (CDC):  Call 651-728-9264 (1-800-CDC-INFO).  Visit the CDC website at http://hunter.com/ CDC Shingles Vaccine VIS (08/28/08) Document Released: 09/06/2006 Document Revised: 02/01/2012 Document Reviewed: 03/01/2013 [facility] Patient Information 2015 [place]. This information is not intended to replace advice given to you by your health care provider. Make sure you discuss any questions you have with your health care provider.   Tdap Vaccine (Tetanus, Diphtheria, Pertussis): What You Need to Know 1. Why get vaccinated? Tetanus, diphtheria and pertussis can be very serious diseases, even for adolescents and adults. Tdap vaccine can protect [place] from these diseases. TETANUS (Lockjaw) causes painful muscle tightening and stiffness, usually all over the body.  It can lead to tightening of muscles in the head and neck so you can't open your mouth, swallow, or sometimes even breathe. Tetanus kills about 1 out of 5 people who are infected. DIPHTHERIA can cause a thick coating to form in the back of the throat.  It can lead to breathing problems, paralysis, heart failure, and death. PERTUSSIS (Whooping Cough) causes severe coughing spells, which can cause difficulty breathing, vomiting and disturbed sleep.  It can also lead to weight loss, incontinence, and rib fractures. Up to 2 in 100 adolescents and 5 in 100 adults with pertussis are hospitalized or have complications, which could include pneumonia or death. These diseases are caused by bacteria. Diphtheria and pertussis are spread from person to person through coughing or sneezing. Tetanus enters the body through cuts, scratches, or wounds. Before vaccines, the [place] saw as many as 200,000 cases a year of diphtheria and pertussis, and hundreds of cases of tetanus. Since vaccination began, tetanus and  diphtheria have dropped by about 99% and pertussis by about 80%. 2. Tdap vaccine Tdap vaccine can protect adolescents and adults from tetanus, diphtheria, and pertussis. One dose of Tdap is routinely given at age 92 or 18. People who did not get Tdap at that age should get it as soon as possible. Tdap is especially important for health care professionals and anyone having close contact with a baby younger than 12 months. Pregnant women should get a dose of Tdap during every pregnancy, to protect the newborn from pertussis. Infants are most at risk for severe, life-threatening complications from pertussis. A similar vaccine, called Td, protects from tetanus and diphtheria, but not pertussis. A Td booster should be given every 10 years. Tdap may be given as one of these boosters if you have not already gotten a dose. Tdap may also be given after a severe cut or burn to prevent tetanus infection. Your doctor can give you more information. Tdap may safely be given  at the same time as other vaccines. 3. Some people should not get this vaccine  If you ever had a life-threatening allergic reaction after a dose of any tetanus, diphtheria, or pertussis containing vaccine, OR if you have a severe allergy to any part of this vaccine, you should not get Tdap. Tell your doctor if you have any severe allergies.  If you had a coma, or long or multiple seizures within 7 days after a childhood dose of DTP or DTaP, you should not get Tdap, unless a cause other than the vaccine was found. You can still get Td.  Talk to your doctor if you:  have epilepsy or another nervous system problem,  had severe pain or swelling after any vaccine containing diphtheria, tetanus or pertussis,  ever had Guillain-Barr Syndrome (GBS),  aren't feeling well on the day the shot is scheduled. 4. Risks of a vaccine reaction With any medicine, including vaccines, there is a chance of side effects. These are usually mild and go away  on their own, but serious reactions are also possible. Brief fainting spells can follow a vaccination, leading to injuries from falling. Sitting or lying down for about 15 minutes can help prevent these. Tell your doctor if you feel dizzy or light-headed, or have vision changes or ringing in the ears. Mild problems following Tdap (Did not interfere with activities)  Pain where the shot was given (about 3 in 4 adolescents or 2 in 3 adults)  Redness or swelling where the shot was given (about 1 person in 5)  Mild fever of at least 100.28F (up to about 1 in 25 adolescents or 1 in 100 adults)  Headache (about 3 or 4 people in 10)  Tiredness (about 1 person in 3 or 4)  Nausea, vomiting, diarrhea, stomach ache (up to 1 in 4 adolescents or 1 in 10 adults)  Chills, body aches, sore joints, rash, swollen glands (uncommon) Moderate problems following Tdap (Interfered with activities, but did not require medical attention)  Pain where the shot was given (about 1 in 5 adolescents or 1 in 100 adults)  Redness or swelling where the shot was given (up to about 1 in 16 adolescents or 1 in 25 adults)  Fever over 102F (about 1 in 100 adolescents or 1 in 250 adults)  Headache (about 3 in 20 adolescents or 1 in 10 adults)  Nausea, vomiting, diarrhea, stomach ache (up to 1 or 3 people in 100)  Swelling of the entire arm where the shot was given (up to about 3 in 100). Severe problems following Tdap (Unable to perform usual activities; required medical attention)  Swelling, severe pain, bleeding and redness in the arm where the shot was given (rare). A severe allergic reaction could occur after any vaccine (estimated less than 1 in a million doses). 5. What if there is a serious reaction? What should I look for?  Look for anything that concerns you, such as signs of a severe allergic reaction, very high fever, or behavior changes. Signs of a severe allergic reaction can include hives, swelling of  the face and throat, difficulty breathing, a fast heartbeat, dizziness, and weakness. These would start a few minutes to a few hours after the vaccination. What should I do?  If you think it is a severe allergic reaction or other emergency that can't wait, call 9-1-1 or get the person to the nearest hospital. Otherwise, call your doctor.  Afterward, the reaction should be reported to the "Vaccine Adverse Event  Reporting System" (VAERS). Your doctor might file this report, or you can do it yourself through the VAERS web site at www.vaers.SamedayNews.es, or by calling 856-113-0840. VAERS is only for reporting reactions. They do not give medical advice.  6. The National Vaccine Injury Compensation Program The [employer] Vaccine Injury Compensation Program (VICP) is a federal program that was created to compensate people who may have been injured by certain vaccines. Persons who believe they may have been injured by a vaccine can learn about the program and about filing a claim by calling [phone] or visiting the [place] website at GoldCloset.com.ee. 7. How can I learn more?  Ask your doctor.  Call your local or state health department.  Contact the Centers for Disease Control and Prevention (CDC):  Call 609-645-5334 or visit CDC's website at http://hunter.com/. CDC Tdap Vaccine VIS (03/31/12) Document Released: 05/10/2012 Document Revised: 03/26/2014 Document Reviewed: 02/21/2014 [employer] Patient Information 2015 [place], [place]. This information is not intended to replace advice given to you by your health care provider. Make sure you discuss any questions you have with your health care provider.

## 2015-04-15 NOTE — Progress Notes (Signed)
   Subjective:    Patient ID: Bradley [name], male    DOB: [birth year], 59 y.o.   MRN: [id number]  HPI    Review of Systems  Constitutional: Negative.   HENT: Negative.   Eyes: Negative.   Respiratory: Negative.   Cardiovascular: Negative.   Gastrointestinal: Negative.   Endocrine: Negative.   Genitourinary: Negative.   Musculoskeletal: Negative.   Skin: Negative.   Allergic/Immunologic: Negative.   Neurological: Negative.   Hematological: Negative.   Psychiatric/Behavioral: Negative.        Objective:   Physical Exam        Assessment & Plan:

## 2015-04-15 NOTE — Progress Notes (Addendum)
Subjective:    Patient ID: Bradley [name], male    DOB: 1955/12/25, 59 y.o.   MRN: [id number] This chart was scribed for Merri Ray, MD by Marti Sleigh, Medical Scribe. This patient was seen in Room 28 and the patient's care was started a 4:21 PM.  Chief Complaint  Patient presents with  . Establish Care    HPI HPI Comments: Bradley [name] is a 59 y.o. male who presents to [facility] reporting to establish care with me. Pt has had a right inguinal hernia repair with mesh in December, 2014, performed by Dr. Ninfa Linden. ETT June 2010 for hx of hyperlipemia and family hx of CAD (both parents had CABG, Mother in 61's, father into his 48's). He had no ST segment changes on EKG. Fitness level was olympic in the 99th percentile. Low risk for ischemia. No recent lipid panel on file. Pt has taken simvastatin in the past. Other pertinent family hx includes a brother with testicular cancer, remote hx. Pt is not fasting today, and will return for blood tests. Pt does not take a baby aspirin daily.  Occasional discomfort and swelling in left side of upper groin lower abdomen past 6 months - one or two   Pt does not report any other specific medical concerns, or issues.   Pt works as a Music therapist.  Pt's last cholesterol screening was 5 years ago.  Cancer Screening:  Colonoscopy: pt has never had a colonoscopy, and reports no family hx Prostate cancer screening:   Immunizations: Shingles: pt would like to do some research before he takes the vaccine Tdap: it's been more than 10 years since his last shot  Depression screening: Depression screen [facility] 2/9 04/15/2015  Decreased Interest 0  Down, Depressed, Hopeless 0  PHQ - 2 Score 0    Vision/Optho: Last visit six months ago Wears corrective lenses  Exercise: Pt states he bikes, runs and does a large amount of gardening  Advanced Directives: Pt has a living will, and would like all life saving techniques if he was found unconscious and  unresponsive   Dentist: Pt reports he goes to the dentist every six months   Patient Active Problem List   Diagnosis Date Noted  . Right inguinal hernia 10/09/2013  . ARTHRITIS, RIGHT HIP 04/29/2009  . Pain in limb 08/30/2007  . UNEQUAL LEG LENGTH, ACQUIRED 08/30/2007   Past Medical History  Diagnosis Date  . Family history of anesthesia complication     father would hallucinates   Past Surgical History  Procedure Laterality Date  . Repair ankle ligament    . Hernia repair    . Tonsillectomy    . Inguinal hernia repair Right 10/23/2013    Procedure: HERNIA REPAIR INGUINAL ADULT;  Surgeon: Harl Bowie, MD;  Location: [place];  Service: General;  Laterality: Right;  . Insertion of mesh Right 10/23/2013    Procedure: INSERTION OF MESH;  Surgeon: Harl Bowie, MD;  Location: [place];  Service: General;  Laterality: Right;  . Joint replacement     No Known Allergies Prior to Admission medications   Medication Sig Start Date End Date Taking? Authorizing Provider  ibuprofen (ADVIL,MOTRIN) 200 MG tablet Take 200-400 mg by mouth every 6 (six) hours as needed (pain).   Yes Historical Provider, MD   History   Social History  . Marital Status: Married    Spouse Name: N/A  . Number of Children: N/A  . Years of Education: N/A   Occupational  History  . Not on file.   Social History Main Topics  . Smoking status: Never Smoker   . Smokeless tobacco: Never Used  . Alcohol Use: 0.0 oz/week    2-3 Cans of beer per week     Comment: occasion  . Drug Use: No  . Sexual Activity: Not on file   Other Topics Concern  . Not on file   Social History Narrative      Review of Systems  13 point review of systems per patient health survey noted.  Negative other than as indicated on reviewed nursing note.      Objective:   Physical Exam  Constitutional: He is oriented to person, place, and time. He appears well-developed and well-nourished. No distress.  HENT:  Head:  Normocephalic and atraumatic.  Right Ear: External ear normal.  Left Ear: External ear normal.  Mouth/Throat: Oropharynx is clear and moist.  Eyes: Conjunctivae and EOM are normal. Pupils are equal, round, and reactive to light.  Neck: Normal range of motion. Neck supple. No thyromegaly present.  Cardiovascular: Normal rate, regular rhythm, normal heart sounds and intact distal pulses.   Pulmonary/Chest: Effort normal and breath sounds normal. No respiratory distress. He has no wheezes.  Abdominal: Soft. He exhibits no distension. There is no tenderness. A hernia is present. Hernia confirmed positive in the left inguinal area. Hernia confirmed negative in the right inguinal area.  Genitourinary:     Musculoskeletal: Normal range of motion. He exhibits no edema or tenderness.  Lymphadenopathy:    He has no cervical adenopathy.  Neurological: He is alert and oriented to person, place, and time. He has normal reflexes. Coordination normal.  Skin: Skin is warm and dry. He is not diaphoretic.  Psychiatric: He has a normal mood and affect. His behavior is normal.  Nursing note and vitals reviewed.    Filed Vitals:   04/15/15 1552  BP: 123/77  Pulse: 49  Temp: 98 F (36.7 C)  TempSrc: Oral  Resp: 16  Height: 5' 10.5" (1.791 m)  Weight: 183 lb (83.008 kg)  SpO2: 100%        Assessment & Plan:   Bradley [name] is a 59 y.o. male Annual physical exam - Plan: COMPLETE METABOLIC PANEL WITH GFR, Lipid panel  --anticipatory guidance as below in AVS, screening labs above. Health maintenance items as above in HPI discussed/recommended as applicable.   -We discussed pros and cons of prostate cancer screening, and after this discussion, he chose to research this further prior to having screening [name].  Family history of cardiovascular disease - Plan: COMPLETE METABOLIC PANEL WITH GFR, Lipid panel, Ambulatory referral to Cardiology  - athletic with reassuring prior stress test, but  significant FH of CAD. Will refer to cardiology to decide on interval of repeat stress testing if needed.   Screen for colon cancer - Plan: Ambulatory referral to Gastroenterology for colonoscopy.   Hyperlipidemia - Plan: COMPLETE METABOLIC PANEL WITH GFR, Lipid panel, Ambulatory referral to Cardiology  -prior history of hyperlipidemia, check levels, then can look at 10 year ASCVD risk.   Need for shingles vaccine - Plan: zoster vaccine live, PF, (ZOSTAVAX) 27035 UNT/0.65ML injection  - rx given if he would like to have this vaccine - CDC.gov from more info.   Need for Tdap vaccination - Plan: Tdap vaccine greater than or equal to 7yo IM given.   Hx of inguinal hernia repair, with possible L sided hernia - follow up with Dr. Ninfa Linden for repeat  exam. Hernia precautions discussed.    Meds ordered this encounter  Medications  . zoster vaccine live, PF, (ZOSTAVAX) 16073 UNT/0.65ML injection    Sig: Inject 19,400 Units into the skin once.    Dispense:  1 each    Refill:  0   Patient Instructions  I will refer you to gastroenterologist and cardiologist as we discussed.  You may want to obtain an appointment with Dr. Ninfa Linden to evaluate the left sided area for possible hernia.  Aspirin 81mg  each day.  If you decide you would like prostate cancer screening - we can perform that at next visit.  I printed shingles vaccine for your pharmacy if you would like to have this vaccine. See info below.     Keeping you healthy  Get these tests  Blood pressure- Have your blood pressure checked once a year by your healthcare provider.  Normal blood pressure is 120/80  Weight- Have your body mass index (BMI) calculated to screen for obesity.  BMI is a measure of body fat based on height and weight. You can also calculate your own BMI at ViewBanking.[email or web address].  Cholesterol- Have your cholesterol checked every year.  Diabetes- Have your blood sugar checked regularly if you have high blood  pressure, high cholesterol, have a family history of diabetes or if you are overweight.  Screening for Colon Cancer- Colonoscopy starting at age 61.  Screening may begin sooner depending on your family history and other health conditions. Follow up colonoscopy as directed by your Gastroenterologist.  Screening for Prostate Cancer- Both blood work (PSA) and a rectal exam help screen for Prostate Cancer.  Screening begins at age 41 with African-American men and at age 19 with Caucasian men.  Screening may begin sooner depending on your family history.  Take these medicines  Aspirin- One aspirin daily can help prevent Heart disease and Stroke.  Flu shot- Every fall.  Tetanus- Every 10 years.  Zostavax- Once after the age of 5 to prevent Shingles.  Pneumonia shot- Once after the age of 7; if you are younger than 64, ask your healthcare provider if you need a Pneumonia shot.  Take these steps  Don't smoke- If you do smoke, talk to your doctor about quitting.  For tips on how to quit, go to www.smokefree.gov or call 1-800-QUIT-NOW.  Be physically active- Exercise 5 days a week for at least 30 minutes.  If you are not already physically active start slow and gradually work up to 30 minutes of moderate physical activity.  Examples of moderate activity include walking briskly, mowing the yard, dancing, swimming, bicycling, etc.  Eat a healthy diet- Eat a variety of healthy food such as fruits, vegetables, low fat milk, low fat cheese, yogurt, lean meant, poultry, fish, beans, tofu, etc. For more information go to www.thenutritionsource.org  Drink alcohol in moderation- Limit alcohol intake to less than two drinks a day. Never drink and drive.  Dentist- Brush and floss twice daily; visit your dentist twice a year.  Depression- Your emotional health is as important as your physical health. If you're feeling down, or losing interest in things you would normally enjoy please talk to your  healthcare provider.  Eye exam- Visit your eye doctor every year.  Safe sex- If you may be exposed to a sexually transmitted infection, use a condom.  Seat belts- Seat belts can save your life; always wear one.  Smoke/Carbon Monoxide detectors- These detectors need to be installed on the appropriate level of your home.  Replace batteries at least once a year.  Skin cancer- When out in the sun, cover up and use sunscreen 15 SPF or higher.  Violence- If anyone is threatening you, please tell your healthcare provider.  Living Will/ Health care power of attorney- Speak with your healthcare provider and family.  Shingles Vaccine What You Need to Know WHAT IS SHINGLES?  Shingles is a painful skin rash, often with blisters. It is also called Herpes Zoster or just Zoster.  A shingles rash usually appears on one side of the face or body and lasts from 2 to 4 weeks. Its main symptom is pain, which can be quite severe. Other symptoms of shingles can include fever, headache, chills, and upset stomach. Very rarely, a shingles infection can lead to pneumonia, hearing problems, blindness, brain inflammation (encephalitis), or death.  For about 1 person in 5, severe pain can continue even after the rash clears up. This is called post-herpetic neuralgia.  Shingles is caused by the Varicella Zoster virus. This is the same virus that causes chickenpox. Only someone who has had a case of chickenpox or rarely, has gotten chickenpox vaccine, can get shingles. The virus stays in your body. It can reappear many years later to cause a case of shingles.  You cannot catch shingles from another person with shingles. However, a person who has never had chickenpox (or chickenpox vaccine) could get chickenpox from someone with shingles. This is not very common.  Shingles is far more common in people 80 and older than in younger people. It is also more common in people whose immune systems are weakened because of a  disease such as cancer or drugs such as steroids or chemotherapy.  At least 1 million people get shingles per year in the [place]. SHINGLES VACCINE  A vaccine for shingles was licensed in 1017. In clinical trials, the vaccine reduced the risk of shingles by 50%. It can also reduce the pain in people who still get shingles after being vaccinated.  A single dose of shingles vaccine is recommended for adults 73 years of age and older. SOME PEOPLE SHOULD NOT GET SHINGLES VACCINE OR SHOULD WAIT A person should not get shingles vaccine if he or she:  Has ever had a life-threatening allergic reaction to gelatin, the antibiotic neomycin, or any other component of shingles vaccine. Tell your caregiver if you have any severe allergies.  Has a weakened immune system because of current:  AIDS or another disease that affects the immune system.  Treatment with drugs that affect the immune system, such as prolonged use of high-dose steroids.  Cancer treatment, such as radiation or chemotherapy.  Cancer affecting the bone marrow or lymphatic system, such as leukemia or lymphoma.  Is pregnant, or might be pregnant. Women should not become pregnant until at least 4 weeks after getting shingles vaccine. Someone with a minor illness, such as a cold, may be vaccinated. Anyone with a moderate or severe acute illness should usually wait until he or she recovers before getting the vaccine. This includes anyone with a temperature of 101.3 F (38 C) or higher. WHAT ARE THE RISKS FROM SHINGLES VACCINE?  A vaccine, like any medicine, could possibly cause serious problems, such as severe allergic reactions. However, the risk of a vaccine causing serious harm, or death, is extremely small.  No serious problems have been identified with shingles vaccine. Mild Problems  Redness, soreness, swelling, or itching at the site of the injection (about 1 person in 3).  Headache (about 1 person in 32). Like all  vaccines, shingles vaccine is being closely monitored for unusual or severe problems. WHAT IF THERE IS A MODERATE OR SEVERE REACTION? What should I look for? Any unusual condition, such as a severe allergic reaction or a high fever. If a severe allergic reaction occurred, it would be within a few minutes to an hour after the shot. Signs of a serious allergic reaction can include difficulty breathing, weakness, hoarseness or wheezing, a fast heartbeat, hives, dizziness, paleness, or swelling of the throat. What should I do?  Call your caregiver, or get the person to a caregiver right away.  Tell the caregiver what happened, the date and time it happened, and when the vaccination was given.  Ask the caregiver to report the reaction by filing a Vaccine Adverse Event Reporting System (VAERS) form. Or, you can file this report through the VAERS web site at www.vaers.SamedayNews.es or by calling (786)379-1920. VAERS does not provide medical advice. HOW CAN I LEARN MORE?  Ask your caregiver. He or she can give you the vaccine package insert or suggest other sources of information.  Contact the Centers for Disease Control and Prevention (CDC):  Call (904)607-4354 (1-800-CDC-INFO).  Visit the CDC website at http://hunter.com/ CDC Shingles Vaccine VIS (08/28/08) Document Released: 09/06/2006 Document Revised: 02/01/2012 Document Reviewed: 03/01/2013 [facility] Patient Information 2015 [place]. This information is not intended to replace advice given to you by your health care provider. Make sure you discuss any questions you have with your health care provider.   Tdap Vaccine (Tetanus, Diphtheria, Pertussis): What You Need to Know 1. Why get vaccinated? Tetanus, diphtheria and pertussis can be very serious diseases, even for adolescents and adults. Tdap vaccine can protect [place] from these diseases. TETANUS (Lockjaw) causes painful muscle tightening and stiffness, usually all over the body.  It  can lead to tightening of muscles in the head and neck so you can't open your mouth, swallow, or sometimes even breathe. Tetanus kills about 1 out of 5 people who are infected. DIPHTHERIA can cause a thick coating to form in the back of the throat.  It can lead to breathing problems, paralysis, heart failure, and death. PERTUSSIS (Whooping Cough) causes severe coughing spells, which can cause difficulty breathing, vomiting and disturbed sleep.  It can also lead to weight loss, incontinence, and rib fractures. Up to 2 in 100 adolescents and 5 in 100 adults with pertussis are hospitalized or have complications, which could include pneumonia or death. These diseases are caused by bacteria. Diphtheria and pertussis are spread from person to person through coughing or sneezing. Tetanus enters the body through cuts, scratches, or wounds. Before vaccines, the [place] saw as many as 200,000 cases a year of diphtheria and pertussis, and hundreds of cases of tetanus. Since vaccination began, tetanus and diphtheria have dropped by about 99% and pertussis by about 80%. 2. Tdap vaccine Tdap vaccine can protect adolescents and adults from tetanus, diphtheria, and pertussis. One dose of Tdap is routinely given at age 67 or 21. People who did not get Tdap at that age should get it as soon as possible. Tdap is especially important for health care professionals and anyone having close contact with a baby younger than 12 months. Pregnant women should get a dose of Tdap during every pregnancy, to protect the newborn from pertussis. Infants are most at risk for severe, life-threatening complications from pertussis. A similar vaccine, called Td, protects from tetanus and diphtheria, but not pertussis. A  Td booster should be given every 10 years. Tdap may be given as one of these boosters if you have not already gotten a dose. Tdap may also be given after a severe cut or burn to prevent tetanus infection. Your doctor  can give you more information. Tdap may safely be given at the same time as other vaccines. 3. Some people should not get this vaccine  If you ever had a life-threatening allergic reaction after a dose of any tetanus, diphtheria, or pertussis containing vaccine, OR if you have a severe allergy to any part of this vaccine, you should not get Tdap. Tell your doctor if you have any severe allergies.  If you had a coma, or long or multiple seizures within 7 days after a childhood dose of DTP or DTaP, you should not get Tdap, unless a cause other than the vaccine was found. You can still get Td.  Talk to your doctor if you:  have epilepsy or another nervous system problem,  had severe pain or swelling after any vaccine containing diphtheria, tetanus or pertussis,  ever had Guillain-Barr Syndrome (GBS),  aren't feeling well on the day the shot is scheduled. 4. Risks of a vaccine reaction With any medicine, including vaccines, there is a chance of side effects. These are usually mild and go away on their own, but serious reactions are also possible. Brief fainting spells can follow a vaccination, leading to injuries from falling. Sitting or lying down for about 15 minutes can help prevent these. Tell your doctor if you feel dizzy or light-headed, or have vision changes or ringing in the ears. Mild problems following Tdap (Did not interfere with activities)  Pain where the shot was given (about 3 in 4 adolescents or 2 in 3 adults)  Redness or swelling where the shot was given (about 1 person in 5)  Mild fever of at least 100.68F (up to about 1 in 25 adolescents or 1 in 100 adults)  Headache (about 3 or 4 people in 10)  Tiredness (about 1 person in 3 or 4)  Nausea, vomiting, diarrhea, stomach ache (up to 1 in 4 adolescents or 1 in 10 adults)  Chills, body aches, sore joints, rash, swollen glands (uncommon) Moderate problems following Tdap (Interfered with activities, but did not require  medical attention)  Pain where the shot was given (about 1 in 5 adolescents or 1 in 100 adults)  Redness or swelling where the shot was given (up to about 1 in 16 adolescents or 1 in 25 adults)  Fever over 102F (about 1 in 100 adolescents or 1 in 250 adults)  Headache (about 3 in 20 adolescents or 1 in 10 adults)  Nausea, vomiting, diarrhea, stomach ache (up to 1 or 3 people in 100)  Swelling of the entire arm where the shot was given (up to about 3 in 100). Severe problems following Tdap (Unable to perform usual activities; required medical attention)  Swelling, severe pain, bleeding and redness in the arm where the shot was given (rare). A severe allergic reaction could occur after any vaccine (estimated less than 1 in a million doses). 5. What if there is a serious reaction? What should I look for?  Look for anything that concerns you, such as signs of a severe allergic reaction, very high fever, or behavior changes. Signs of a severe allergic reaction can include hives, swelling of the face and throat, difficulty breathing, a fast heartbeat, dizziness, and weakness. These would start a few minutes to  a few hours after the vaccination. What should I do?  If you think it is a severe allergic reaction or other emergency that can't wait, call 9-1-1 or get the person to the nearest hospital. Otherwise, call your doctor.  Afterward, the reaction should be reported to the "Vaccine Adverse Event Reporting System" (VAERS). Your doctor might file this report, or you can do it yourself through the VAERS web site at www.vaers.SamedayNews.es, or by calling (229)330-8812. VAERS is only for reporting reactions. They do not give medical advice.  6. The National Vaccine Injury Compensation Program The [employer] Vaccine Injury Compensation Program (VICP) is a federal program that was created to compensate people who may have been injured by certain vaccines. Persons who believe they may have been injured  by a vaccine can learn about the program and about filing a claim by calling 417-837-0526 or visiting the [place] website at GoldCloset.com.ee. 7. How can I learn more?  Ask your doctor.  Call your local or state health department.  Contact the Centers for Disease Control and Prevention (CDC):  Call (734) 075-5331 or visit CDC's website at http://hunter.com/. CDC Tdap Vaccine VIS (03/31/12) Document Released: 05/10/2012 Document Revised: 03/26/2014 Document Reviewed: 02/21/2014 [employer] Patient Information 2015 [place], [place]. This information is not intended to replace advice given to you by your health care provider. Make sure you discuss any questions you have with your health care provider.        I personally performed the services described in this documentation, which was scribed in my presence. The recorded information has been reviewed and considered, and addended by me as needed.

## 2015-05-30 ENCOUNTER — Encounter: Payer: Self-pay | Admitting: Cardiology

## 2015-05-30 ENCOUNTER — Ambulatory Visit (INDEPENDENT_AMBULATORY_CARE_PROVIDER_SITE_OTHER): Payer: No Typology Code available for payment source | Admitting: Cardiology

## 2015-05-30 VITALS — BP 150/97 | HR 52 | Ht 71.0 in | Wt 184.0 lb

## 2015-05-30 DIAGNOSIS — Z8249 Family history of ischemic heart disease and other diseases of the circulatory system: Secondary | ICD-10-CM | POA: Diagnosis not present

## 2015-05-30 DIAGNOSIS — M129 Arthropathy, unspecified: Secondary | ICD-10-CM

## 2015-05-30 DIAGNOSIS — M1610 Unilateral primary osteoarthritis, unspecified hip: Secondary | ICD-10-CM

## 2015-05-30 NOTE — Progress Notes (Signed)
Cardiology Office Note   Date:  05/30/2015   ID:  Bradley [name], DOB 01-23-1956, MRN [id number]  PCP:  Stefanie Libel, MD  Cardiologist:   Minus Breeding, MD   Chief Complaint  Patient presents with  . Family History of CAD      History of Present Illness: Bradley [name] is a 59 y.o. male who presents for evaluation of cardiovascular risk factors. He has a strong family history of early heart disease. He has not had vascular disease himself. He reports a stress test maybe 5 or 6 years ago. He is active and athletic. With his activity he denies any cardiovascular symptoms. The patient denies any new symptoms such as chest discomfort, neck or arm discomfort. There has been no new shortness of breath, PND or orthopnea. There have been no reported palpitations, presyncope or syncope.    History reviewed. No pertinent past medical history.  Past Surgical History  Procedure Laterality Date  . Repair ankle ligament    . Hernia repair    . Tonsillectomy    . Inguinal hernia repair Right 10/23/2013    Procedure: HERNIA REPAIR INGUINAL ADULT;  Surgeon: Harl Bowie, MD;  Location: [place];  Service: General;  Laterality: Right;  . Insertion of mesh Right 10/23/2013    Procedure: INSERTION OF MESH;  Surgeon: Harl Bowie, MD;  Location: [facility];  Service: General;  Laterality: Right;  . Joint replacement Right     hip resurfacing     No current outpatient prescriptions on file.   No current facility-administered medications for this visit.    Allergies:   Review of patient's allergies indicates no known allergies.    Social History:  The patient  reports that he has never smoked. He has never used smokeless tobacco. He reports that he drinks alcohol. He reports that he does not use illicit drugs.   Family History:  The patient's family history includes CAD (age of onset: 71) in his sister; CAD (age of onset: 54) in his father and mother; Cancer in his brother and mother;  Hyperlipidemia in his brother and brother; Stroke (age of onset: 74) in his mother.    ROS:  Please see the history of present illness.   Otherwise, review of systems are positive for none.   All other systems are reviewed and negative.    PHYSICAL EXAM: VS:  BP 150/97 mmHg  Pulse 52  Ht 5\' 11"  (1.803 m)  Wt 184 lb (83.462 kg)  BMI 25.67 kg/m2 , BMI Body mass index is 25.67 kg/(m^2). GENERAL:  Well appearing HEENT:  Pupils equal round and reactive, fundi not visualized, oral mucosa unremarkable NECK:  No jugular venous distention, waveform within normal limits, carotid upstroke brisk and symmetric, no bruits, no thyromegaly LYMPHATICS:  No cervical, inguinal adenopathy LUNGS:  Clear to auscultation bilaterally BACK:  No CVA tenderness CHEST:  Unremarkable HEART:  PMI not displaced or sustained,S1 and S2 within normal limits, no S3, no S4, no clicks, no rubs, no murmurs ABD:  Flat, positive bowel sounds normal in frequency in pitch, no bruits, no rebound, no guarding, no midline pulsatile mass, no hepatomegaly, no splenomegaly EXT:  2 plus pulses throughout, no edema, no cyanosis no clubbing SKIN:  No rashes no nodules NEURO:  Cranial nerves II through XII grossly intact, motor grossly intact throughout PSYCH:  Cognitively intact, oriented to person place and time    EKG:  EKG is ordered today. The ekg ordered today demonstrates  sinus rhythm, rate 52, axis within normal limits, intervals within normal limits, no acute ST-T wave changes.   Recent Labs: No results found for requested labs within last 365 days.    Lipid Panel No results found for: CHOL, TRIG, HDL, CHOLHDL, VLDL, LDLCALC, LDLDIRECT       Wt Readings from Last 3 Encounters:  05/30/15 184 lb (83.462 kg)  04/15/15 183 lb (83.008 kg)  11/07/13 190 lb 6.4 oz (86.365 kg)      Other studies Reviewed: Additional studies/ records that were reviewed today include: None. Review of the above records demonstrates:   Please see elsewhere in the note.    ASSESSMENT AND PLAN:  FAMILY HISTORY OF CAD:  The patient has a strong family history of early coronary artery disease. Given this screening is indicated. He will have a POET (Plain Old Exercise Treadmill) and coronary calcium score.  He can have a fasting lipid profile as well.   HTN:  This is an unusual BP reading.  We talked about keeping a check on this and letting me know if it is running high.  Current medicines are reviewed at length with the patient today.  The patient does not have concerns regarding medicines.  The following changes have been made:  no change  Labs/ tests ordered today include:   Orders Placed This Encounter  Procedures  . CT Cardiac Scoring  . Lipid Profile  . Exercise Tolerance Test  . EKG 12-Lead     Disposition:   FU with me as needed.      Signed, Minus Breeding, MD  05/30/2015 4:55 PM    [facility]

## 2015-05-30 NOTE — Patient Instructions (Signed)
Your physician recommends that you schedule a follow-up appointment As Needed  Your physician has requested that you have an exercise tolerance test. For further information please visit [email or web address]. Please also follow instruction sheet, as given.  Your physician recommends that you return for lab work Fasting Lipids  Your physician recommends that you have a CT calcium Scoring test this is done at our [facility]

## 2015-06-19 ENCOUNTER — Ambulatory Visit (INDEPENDENT_AMBULATORY_CARE_PROVIDER_SITE_OTHER)
Admission: RE | Admit: 2015-06-19 | Discharge: 2015-06-19 | Disposition: A | Discharge status: Home / Self Care | Payer: Self-pay | Source: Ambulatory Visit | Attending: Cardiology | Admitting: Cardiology

## 2015-06-19 ENCOUNTER — Ambulatory Visit (INDEPENDENT_AMBULATORY_CARE_PROVIDER_SITE_OTHER): Payer: No Typology Code available for payment source

## 2015-06-19 ENCOUNTER — Encounter: Payer: No Typology Code available for payment source | Admitting: Physician Assistant

## 2015-06-19 DIAGNOSIS — Z8249 Family history of ischemic heart disease and other diseases of the circulatory system: Secondary | ICD-10-CM

## 2015-06-19 LAB — EXERCISE TOLERANCE TEST
Estimated workload: 15.3 METS
Exercise duration (min): 13 min
MPHR: 159 {beats}/min
Peak HR: 160 {beats}/min
Percent HR: 100 %
RPE: 15
Rest HR: 54 {beats}/min

## 2015-08-01 ENCOUNTER — Encounter (HOSPITAL_COMMUNITY)
Admission: EM | Disposition: A | Discharge status: Home / Self Care | Payer: Self-pay | Source: Home / Self Care | Attending: Cardiology

## 2015-08-01 ENCOUNTER — Inpatient Hospital Stay (HOSPITAL_COMMUNITY)
Admission: EM | Admit: 2015-08-01 | Discharge: 2015-08-03 | DRG: 247 | Disposition: A | Discharge status: Home / Self Care | Payer: No Typology Code available for payment source | Attending: Cardiology | Admitting: Cardiology

## 2015-08-01 ENCOUNTER — Encounter (HOSPITAL_COMMUNITY): Payer: Self-pay | Admitting: *Deleted

## 2015-08-01 ENCOUNTER — Emergency Department (HOSPITAL_COMMUNITY): Payer: No Typology Code available for payment source

## 2015-08-01 DIAGNOSIS — R079 Chest pain, unspecified: Secondary | ICD-10-CM | POA: Diagnosis not present

## 2015-08-01 DIAGNOSIS — I2102 ST elevation (STEMI) myocardial infarction involving left anterior descending coronary artery: Principal | ICD-10-CM | POA: Diagnosis present

## 2015-08-01 DIAGNOSIS — I2510 Atherosclerotic heart disease of native coronary artery without angina pectoris: Secondary | ICD-10-CM | POA: Diagnosis present

## 2015-08-01 DIAGNOSIS — I213 ST elevation (STEMI) myocardial infarction of unspecified site: Secondary | ICD-10-CM

## 2015-08-01 DIAGNOSIS — I2101 ST elevation (STEMI) myocardial infarction involving left main coronary artery: Secondary | ICD-10-CM

## 2015-08-01 DIAGNOSIS — I080 Rheumatic disorders of both mitral and aortic valves: Secondary | ICD-10-CM | POA: Diagnosis present

## 2015-08-01 DIAGNOSIS — Z96641 Presence of right artificial hip joint: Secondary | ICD-10-CM | POA: Diagnosis present

## 2015-08-01 DIAGNOSIS — Z823 Family history of stroke: Secondary | ICD-10-CM

## 2015-08-01 DIAGNOSIS — I255 Ischemic cardiomyopathy: Secondary | ICD-10-CM | POA: Diagnosis present

## 2015-08-01 DIAGNOSIS — Z8249 Family history of ischemic heart disease and other diseases of the circulatory system: Secondary | ICD-10-CM

## 2015-08-01 HISTORY — PX: CARDIAC CATHETERIZATION: SHX172

## 2015-08-01 LAB — PROTIME-INR
INR: 1.03 (ref 0.00–1.49)
PROTHROMBIN TIME: 13.7 s (ref 11.6–15.2)

## 2015-08-01 LAB — BASIC METABOLIC PANEL
Anion gap: 12 (ref 5–15)
BUN: 14 mg/dL (ref 6–20)
CHLORIDE: 105 mmol/L (ref 101–111)
CO2: 24 mmol/L (ref 22–32)
CREATININE: 1.19 mg/dL (ref 0.61–1.24)
Calcium: 9.5 mg/dL (ref 8.9–10.3)
GFR calc Af Amer: >60 mL/min (ref >60)
Glucose, Bld: 106 mg/dL — ABNORMAL HIGH (ref 65–99)
Potassium: 3.4 mmol/L — ABNORMAL LOW (ref 3.5–5.1)
SODIUM: 141 mmol/L (ref 135–145)

## 2015-08-01 LAB — I-STAT CHEM 8, ED
BUN: 17 mg/dL (ref 6–20)
CALCIUM ION: 1.17 mmol/L (ref 1.13–1.30)
Chloride: 105 mmol/L (ref 101–111)
Creatinine, Ser: 1.2 mg/dL (ref 0.61–1.24)
GLUCOSE: 103 mg/dL — AB (ref 65–99)
HCT: 49 % (ref 39.0–52.0)
Hemoglobin: 16.7 g/dL (ref 13.0–17.0)
Potassium: 3.4 mmol/L — ABNORMAL LOW (ref 3.5–5.1)
SODIUM: 143 mmol/L (ref 135–145)
TCO2: 22 mmol/L (ref 0–100)

## 2015-08-01 LAB — CBC
HCT: 44.8 % (ref 39.0–52.0)
Hemoglobin: 15.1 g/dL (ref 13.0–17.0)
MCH: 32.2 pg (ref 26.0–34.0)
MCHC: 33.7 g/dL (ref 30.0–36.0)
MCV: 95.5 fL (ref 78.0–100.0)
PLATELETS: 160 10*3/uL (ref 150–400)
RBC: 4.69 MIL/uL (ref 4.22–5.81)
RDW: 13 % (ref 11.5–15.5)
WBC: 9.2 10*3/uL (ref 4.0–10.5)

## 2015-08-01 LAB — DIFFERENTIAL
BASOS ABS: 0 10*3/uL (ref 0.0–0.1)
BASOS PCT: 0 % (ref 0–1)
Eosinophils Absolute: 0.2 10*3/uL (ref 0.0–0.7)
Eosinophils Relative: 2 % (ref 0–5)
Lymphocytes Relative: 30 % (ref 12–46)
Lymphs Abs: 2.8 10*3/uL (ref 0.7–4.0)
Monocytes Absolute: 0.7 10*3/uL (ref 0.1–1.0)
Monocytes Relative: 8 % (ref 3–12)
NEUTROS ABS: 5.5 10*3/uL (ref 1.7–7.7)
Neutrophils Relative %: 60 % (ref 43–77)

## 2015-08-01 LAB — APTT: APTT: 27 s (ref 24–37)

## 2015-08-01 LAB — TROPONIN I: Troponin I: 0.08 ng/mL — ABNORMAL HIGH (ref <0.031)

## 2015-08-01 LAB — I-STAT TROPONIN, ED: TROPONIN I, POC: 0.09 ng/mL — AB (ref 0.00–0.08)

## 2015-08-01 SURGERY — LEFT HEART CATH AND CORONARY ANGIOGRAPHY

## 2015-08-01 MED ORDER — TICAGRELOR 90 MG PO TABS
ORAL_TABLET | ORAL | Status: AC
  Filled 2015-08-01: qty 1

## 2015-08-01 MED ORDER — LIDOCAINE HCL (PF) 1 % IJ SOLN
INTRAMUSCULAR | Status: AC
  Filled 2015-08-01: qty 30

## 2015-08-01 MED ORDER — HEPARIN SODIUM (PORCINE) 5000 UNIT/ML IJ SOLN
4000.0000 [IU] | Freq: Once | INTRAMUSCULAR | Status: AC
  Administered 2015-08-01: 4000 [IU] via INTRAVENOUS

## 2015-08-01 MED ORDER — NITROGLYCERIN 5 MG/ML IV SOLN
INTRAVENOUS | Status: DC | PRN
  Administered 2015-08-01: 23:00:00 via INTRA_ARTERIAL

## 2015-08-01 MED ORDER — MIDAZOLAM HCL 2 MG/2ML IJ SOLN
INTRAMUSCULAR | Status: DC | PRN
  Administered 2015-08-01: 1 mg via INTRAVENOUS

## 2015-08-01 MED ORDER — HEPARIN (PORCINE) IN NACL 2-0.9 UNIT/ML-% IJ SOLN
INTRAMUSCULAR | Status: AC
  Filled 2015-08-01: qty 1000

## 2015-08-01 MED ORDER — HEPARIN BOLUS VIA INFUSION
4000.0000 [IU] | Freq: Once | INTRAVENOUS | Status: DC
  Filled 2015-08-01: qty 4000

## 2015-08-01 MED ORDER — ASPIRIN 81 MG PO CHEW
324.0000 mg | CHEWABLE_TABLET | Freq: Once | ORAL | Status: AC
  Administered 2015-08-01: 324 mg via ORAL

## 2015-08-01 MED ORDER — SODIUM CHLORIDE 0.9 % IV SOLN
INTRAVENOUS | Status: DC | PRN
  Administered 2015-08-01: 200 mL via INTRAVENOUS

## 2015-08-01 MED ORDER — HEPARIN SODIUM (PORCINE) 1000 UNIT/ML IJ SOLN
INTRAMUSCULAR | Status: AC
  Filled 2015-08-01: qty 1

## 2015-08-01 MED ORDER — MIDAZOLAM HCL 2 MG/2ML IJ SOLN
INTRAMUSCULAR | Status: AC
  Filled 2015-08-01: qty 4

## 2015-08-01 MED ORDER — BIVALIRUDIN BOLUS VIA INFUSION - CUPID
INTRAVENOUS | Status: DC | PRN
  Administered 2015-08-01: 61.2 mg via INTRAVENOUS

## 2015-08-01 MED ORDER — FENTANYL CITRATE (PF) 100 MCG/2ML IJ SOLN
50.0000 ug | Freq: Once | INTRAMUSCULAR | Status: AC
  Administered 2015-08-01: 50 ug via INTRAVENOUS

## 2015-08-01 MED ORDER — BIVALIRUDIN 250 MG IV SOLR
INTRAVENOUS | Status: AC
  Filled 2015-08-01: qty 250

## 2015-08-01 MED ORDER — SODIUM CHLORIDE 0.9 % IV SOLN
250.0000 mg | INTRAVENOUS | Status: DC | PRN
  Administered 2015-08-01: 1.75 mg/kg/h via INTRAVENOUS

## 2015-08-01 MED ORDER — FENTANYL CITRATE (PF) 100 MCG/2ML IJ SOLN
INTRAMUSCULAR | Status: AC
  Filled 2015-08-01: qty 2

## 2015-08-01 MED ORDER — HEPARIN SODIUM (PORCINE) 5000 UNIT/ML IJ SOLN
INTRAMUSCULAR | Status: AC
  Filled 2015-08-01: qty 1

## 2015-08-01 MED ORDER — FENTANYL CITRATE (PF) 100 MCG/2ML IJ SOLN
INTRAMUSCULAR | Status: DC | PRN
  Administered 2015-08-01 (×2): 25 ug via INTRAVENOUS

## 2015-08-01 MED ORDER — FENTANYL CITRATE (PF) 100 MCG/2ML IJ SOLN
INTRAMUSCULAR | Status: AC
  Filled 2015-08-01: qty 4

## 2015-08-01 MED ORDER — NITROGLYCERIN 1 MG/10 ML FOR IR/CATH LAB
INTRA_ARTERIAL | Status: AC
  Filled 2015-08-01: qty 10

## 2015-08-01 MED ORDER — HEPARIN (PORCINE) IN NACL 100-0.45 UNIT/ML-% IJ SOLN
1000.0000 [IU]/h | INTRAMUSCULAR | Status: DC
  Filled 2015-08-01: qty 250

## 2015-08-01 MED ORDER — NITROGLYCERIN 0.4 MG SL SUBL
0.4000 mg | SUBLINGUAL_TABLET | SUBLINGUAL | Status: DC | PRN
  Administered 2015-08-01 (×2): 0.4 mg via SUBLINGUAL
  Filled 2015-08-01: qty 1

## 2015-08-01 MED ORDER — VERAPAMIL HCL 2.5 MG/ML IV SOLN
INTRAVENOUS | Status: AC
  Filled 2015-08-01: qty 2

## 2015-08-01 MED ORDER — TICAGRELOR 90 MG PO TABS
ORAL_TABLET | ORAL | Status: DC | PRN
  Administered 2015-08-01: 180 mg via ORAL

## 2015-08-01 SURGICAL SUPPLY — 19 items
BALLN EMERGE MR 2.5X12 (BALLOONS) ×2
BALLN ~~LOC~~ EMERGE MR 4.0X20 (BALLOONS) ×2
BALLOON EMERGE MR 2.5X12 (BALLOONS) IMPLANT
BALLOON ~~LOC~~ EMERGE MR 4.0X20 (BALLOONS) IMPLANT
CATH INFINITI 5FR ANG PIGTAIL (CATHETERS) ×1 IMPLANT
CATH OPTITORQUE TIG 4.0 5F (CATHETERS) ×1 IMPLANT
CATH VISTA GUIDE 6FRXBLAD3.5SH (CATHETERS) ×1 IMPLANT
DEVICE RAD COMP TR BAND LRG (VASCULAR PRODUCTS) ×1 IMPLANT
GLIDESHEATH SLEND A-KIT 6F 20G (SHEATH) ×1 IMPLANT
KIT ENCORE 26 ADVANTAGE (KITS) ×1 IMPLANT
KIT HEART LEFT (KITS) ×1 IMPLANT
PACK CARDIAC CATHETERIZATION (CUSTOM PROCEDURE TRAY) ×1 IMPLANT
STENT XIENCE ALPINE RX 3.5X33 (Permanent Stent) ×1 IMPLANT
SYR MEDRAD MARK V 150ML (SYRINGE) ×1 IMPLANT
TRANSDUCER W/STOPCOCK (MISCELLANEOUS) ×1 IMPLANT
TUBING CIL FLEX 10 FLL-RA (TUBING) ×1 IMPLANT
WIRE ASAHI PROWATER 180CM (WIRE) ×1 IMPLANT
WIRE HI TORQ BMW 190CM (WIRE) ×1 IMPLANT
WIRE SAFE-T 1.5MM-J .035X260CM (WIRE) ×1 IMPLANT

## 2015-08-01 NOTE — ED Notes (Signed)
Pt took 1 81mg  ASA before arrival.

## 2015-08-01 NOTE — Consult Note (Signed)
Referring Physician: ER Primary Cardiologist: Minus Breeding, MD -- seen by him on 05/30/2015 Reason for Consultation: STEMI   HPI: 59 yo CA man with family history of CAD, recent low risk ETT in 05/2015 and low CAC score, presents with crushing substernal chest pressure, diaphoresis, SOB, dizziness. Symptoms started about 45 minutes prior to arrival. No syncope, bleeding, N/V, fever. In the ER, [facility] suggested anterolateral STEMI, SR, normal AV conduction, narrow QRS. SBP in 130-140;s, NSR on tele, normal o2 sats. Given ASA and Heparin bolus and sl NTG with significant relief of symptoms and ST changes.     Review of Systems:  10 systems reviewed unremarkable except as noted in HPI   History reviewed. No pertinent past medical history.  No prescriptions prior to admission   Allergies:  NKDA   Meds:  Does not take any meds      Infusions: . heparin      No Known Allergies  Social History   Social History  . Marital Status: Married    Spouse Name: N/A  . Number of Children: 4  . Years of Education: N/A   Occupational History  . Facilities manager    Social History Main Topics  . Smoking status: Never Smoker   . Smokeless tobacco: Never Used  . Alcohol Use: 0.0 oz/week    2-3 Cans of beer per week     Comment: occasion  . Drug Use: No  . Sexual Activity: Not on file   Other Topics Concern  . Not on file   Social History Narrative   Lives at home with wife.     Family History  Problem Relation Age of Onset  . Stroke Mother 73  . Cancer Mother     breast  . CAD Father 66    CABG  . CAD Sister 76  . Cancer Brother     testicular cancer  . Hyperlipidemia Brother   . Hyperlipidemia Brother   . CAD Mother 53    PHYSICAL EXAM: Filed Vitals:   08/01/15 2245  BP:   Pulse: 60  Temp:   Resp: 12    No intake or output data in the 24 hours ending 08/01/15 2308  General:  Well appearing. Anxious, diaphoretic HEENT: normal Neck: supple. no JVD.  Carotids 2+ bilat; no bruits. No lymphadenopathy or thryomegaly appreciated. Cor: PMI nondisplaced. Regular rate & rhythm. No rubs, gallops or murmurs. Lungs: clear Abdomen: soft, nontender, nondistended. No hepatosplenomegaly. No bruits or masses. Good bowel sounds. Extremities: no cyanosis, clubbing, rash, edema Neuro: alert & oriented x 3, cranial nerves grossly intact. moves all 4 extremities w/o difficulty. Affect pleasant.    Results for orders placed or performed during the hospital encounter of 08/01/15 (from the past 24 hour(s))  I-stat troponin, ED     Status: Abnormal   Collection Time: 08/01/15 10:24 PM  Result Value Ref Range   Troponin i, poc 0.09 (HH) 0.00 - 0.08 ng/mL   Comment NOTIFIED PHYSICIAN    Comment 3          I-Stat Chem 8, ED  (not at [facility], [facility])     Status: Abnormal   Collection Time: 08/01/15 10:26 PM  Result Value Ref Range   Sodium 143 135 - 145 mmol/L   Potassium 3.4 (L) 3.5 - 5.1 mmol/L   Chloride 105 101 - 111 mmol/L   BUN 17 6 - 20 mg/dL   Creatinine, Ser 1.20 0.61 - 1.24 mg/dL   Glucose, Bld  103 (H) 65 - 99 mg/dL   Calcium, Ion 1.17 1.13 - 1.30 mmol/L   TCO2 22 0 - 100 mmol/L   Hemoglobin 16.7 13.0 - 17.0 g/dL   HCT 49.0 39.0 - 52.0 %  CBC     Status: None   Collection Time: 08/01/15 10:27 PM  Result Value Ref Range   WBC 9.2 4.0 - 10.5 K/uL   RBC 4.69 4.22 - 5.81 MIL/uL   Hemoglobin 15.1 13.0 - 17.0 g/dL   HCT 44.8 39.0 - 52.0 %   MCV 95.5 78.0 - 100.0 fL   MCH 32.2 26.0 - 34.0 pg   MCHC 33.7 30.0 - 36.0 g/dL   RDW 13.0 11.5 - 15.5 %   Platelets 160 150 - 400 K/uL  Differential     Status: None   Collection Time: 08/01/15 10:27 PM  Result Value Ref Range   Neutrophils Relative % 60 43 - 77 %   Neutro Abs 5.5 1.7 - 7.7 K/uL   Lymphocytes Relative 30 12 - 46 %   Lymphs Abs 2.8 0.7 - 4.0 K/uL   Monocytes Relative 8 3 - 12 %   Monocytes Absolute 0.7 0.1 - 1.0 K/uL   Eosinophils Relative 2 0 - 5 %   Eosinophils Absolute 0.2 0.0 - 0.7  K/uL   Basophils Relative 0 0 - 1 %   Basophils Absolute 0.0 0.0 - 0.1 K/uL  Protime-INR     Status: None   Collection Time: 08/01/15 10:27 PM  Result Value Ref Range   Prothrombin Time 13.7 11.6 - 15.2 seconds   INR 1.03 0.00 - 1.49  APTT     Status: None   Collection Time: 08/01/15 10:27 PM  Result Value Ref Range   aPTT 27 24 - 37 seconds  Basic metabolic panel     Status: Abnormal   Collection Time: 08/01/15 10:27 PM  Result Value Ref Range   Sodium 141 135 - 145 mmol/L   Potassium 3.4 (L) 3.5 - 5.1 mmol/L   Chloride 105 101 - 111 mmol/L   CO2 24 22 - 32 mmol/L   Glucose, Bld 106 (H) 65 - 99 mg/dL   BUN 14 6 - 20 mg/dL   Creatinine, Ser 1.19 0.61 - 1.24 mg/dL   Calcium 9.5 8.9 - 10.3 mg/dL   GFR calc non Af Amer >60 >60 mL/min   GFR calc Af Amer >60 >60 mL/min   Anion gap 12 5 - 15  Troponin I     Status: Abnormal   Collection Time: 08/01/15 10:27 PM  Result Value Ref Range   Troponin I 0.08 (H) <0.031 ng/mL   Dg Chest Portable 1 View  08/01/2015   CLINICAL DATA:  ST-elevation myocardial infarction  EXAM: PORTABLE CHEST - 1 VIEW  COMPARISON:  Prior stud  FINDINGS: Mild cardiac enlargement. 5 mm granuloma left mid lung zone. Vascular pattern normal. Lungs clear.  IMPRESSION: No active disease.   Electronically Signed   By: Skipper Cliche M.D.   On: 08/01/2015 23:05     ASSESSMENT:  1. STEMI, anterolateral  - normal BP and rhythm   PLAN/DISCUSSION:  To cath lab  Discussed with interventional cardiology and ER attending.   Wandra Mannan, MD Cardiology

## 2015-08-01 NOTE — Progress Notes (Signed)
   08/01/15 2300  Clinical Encounter Type  Visited With Family  Visit Type Spiritual support  Referral From Nurse  Spiritual Encounters  Spiritual Needs Prayer;Emotional;Other (Comment) (Company )  Stress Factors  Family Stress Factors Lack of knowledge;Loss of control  Code STEMI; heart attack; patient's wife alone in heart waiting room crying. Responded to chaplain's arrival by expressing gratitude for having someone with her. Wife asked for prayers. Chaplain stayed with her until friend arrived, then brought her water and told her to have chaplain paged if needed.

## 2015-08-01 NOTE — ED Notes (Signed)
Pt c/o CP that started an hour ago. States he was singing karaoke when it started. Radiation to left arm. Pt diaphoretic in triage.

## 2015-08-01 NOTE — Progress Notes (Signed)
ANTICOAGULATION CONSULT NOTE - Initial Consult  Pharmacy Consult for heparin Indication: chest pain/ACS  No Known Allergies  Patient Measurements: Height: 5\' 11"  (180.3 cm) Weight: 180 lb (81.647 kg) IBW/kg (Calculated) : 75.3  Vital Signs: Temp: 97.4 F (36.3 C) (09/08 2213) Temp Source: Oral (09/08 2213) BP: 131/80 mmHg (09/08 2235) Pulse Rate: 63 (09/08 2235)  Labs:  Recent Labs  08/01/15 2226 08/01/15 2227  HGB 16.7 15.1  HCT 49.0 44.8  PLT  --  160  APTT  --  27  LABPROT  --  13.7  INR  --  1.03  CREATININE 1.20  --     Estimated Creatinine Clearance: 68.9 mL/min (by C-G formula based on Cr of 1.2).   Medical History: History reviewed. No pertinent past medical history.   Assessment: 59yo male c/o CP radiating to left arm that started one hour ago, noted to be diaphoretic in triage, initial istat troponin elevated, to begin heparin.  Goal of Therapy:  Heparin level 0.3-0.7 units/ml Monitor platelets by anticoagulation protocol: Yes   Plan:  Will give heparin 4000 units IV bolus x1 followed by gtt at 1000 units/hr and monitor heparin levels and CBC.  Wynona Neat, PharmD, BCPS  08/01/2015,10:42 PM

## 2015-08-01 NOTE — ED Provider Notes (Signed)
CSN: [id number]     Arrival date & time 08/01/15  2206 History   First MD Initiated Contact with Patient 08/01/15 2216     Chief Complaint  Patient presents with  . Code STEMI     (Consider location/radiation/quality/duration/timing/severity/associated sxs/prior Treatment) Patient is a 59 y.o. male presenting with chest pain. The history is provided by the patient.  Chest Pain Pain location:  L chest Pain quality: sharp   Pain radiates to:  L shoulder Pain radiates to the back: yes   Pain severity:  Severe Onset quality:  Sudden Duration:  1 hour Timing:  Constant Ineffective treatments:  None tried Associated symptoms: shortness of breath   Risk factors: male sex     History reviewed. No pertinent past medical history. Past Surgical History  Procedure Laterality Date  . Repair ankle ligament    . Hernia repair    . Tonsillectomy    . Inguinal hernia repair Right 10/23/2013    Procedure: HERNIA REPAIR INGUINAL ADULT;  Surgeon: Harl Bowie, MD;  Location: [place];  Service: General;  Laterality: Right;  . Insertion of mesh Right 10/23/2013    Procedure: INSERTION OF MESH;  Surgeon: Harl Bowie, MD;  Location: [place] [facility];  Service: General;  Laterality: Right;  . Joint replacement Right     hip resurfacing  . Cardiac catheterization N/A 08/01/2015    Procedure: Left Heart Cath and Coronary Angiography;  Surgeon: Leonie Man, MD;  Location: [place] [facility];  Service: Cardiovascular;  Laterality: N/A;  . Cardiac catheterization N/A 08/01/2015    Procedure: Coronary Stent Intervention;  Surgeon: Leonie Man, MD;  Location: [place];  Service: Cardiovascular;  Laterality: N/A;   Family History  Problem Relation Age of Onset  . Stroke Mother 21  . Cancer Mother     breast  . CAD Father 16    CABG  . CAD Sister 26  . Cancer Brother     testicular cancer  . Hyperlipidemia Brother   . Hyperlipidemia Brother   . CAD Mother 4   Social History   Substance Use Topics  . Smoking status: Never Smoker   . Smokeless tobacco: Never Used  . Alcohol Use: 0.0 oz/week    2-3 Cans of beer per week     Comment: occasion    Review of Systems  Respiratory: Positive for shortness of breath.   Cardiovascular: Positive for chest pain.  All other systems reviewed and are negative.     Allergies  Review of patient's allergies indicates no known allergies.  Home Medications   Prior to Admission medications   Medication Sig Start Date End Date Taking? Authorizing Provider  aspirin 81 MG chewable tablet Chew 1 tablet (81 mg total) by mouth daily. 08/03/15   Brittainy Erie Noe, PA-C  atorvastatin (LIPITOR) 80 MG tablet Take 1 tablet (80 mg total) by mouth daily at 6 PM. 08/03/15   Brittainy M Rosita Fire, PA-C  lisinopril (PRINIVIL,ZESTRIL) 2.5 MG tablet Take 1 tablet (2.5 mg total) by mouth daily. 08/03/15   Brittainy Erie Noe, PA-C  metoprolol succinate (TOPROL-XL) 25 MG 24 hr tablet Take 0.5 tablets (12.5 mg total) by mouth daily. 08/03/15   Brittainy Erie Noe, PA-C  nitroGLYCERIN (NITROSTAT) 0.4 MG SL tablet Place 1 tablet (0.4 mg total) under the tongue every 5 (five) minutes as needed for chest pain. 08/03/15   Brittainy Erie Noe, PA-C  ticagrelor (BRILINTA) 90 MG TABS tablet Take 1 tablet (90 mg  total) by mouth 2 (two) times daily. 08/03/15   Brittainy Erie Noe, PA-C  ticagrelor (BRILINTA) 90 MG TABS tablet Take 1 tablet (90 mg total) by mouth 2 (two) times daily. 08/03/15   Brittainy M Simmons, PA-C   BP 122/73 mmHg  Pulse 75  Temp(Src) 98.5 F (36.9 C) (Oral)  Resp 18  Ht 5\' 11"  (1.803 m)  Wt 179 lb 9.6 oz (81.466 kg)  BMI 25.06 kg/m2  SpO2 99% Physical Exam  Constitutional: He is oriented to person, place, and time. He appears well-developed.  HENT:  Head: Normocephalic and atraumatic.  Eyes: Conjunctivae and EOM are normal. Pupils are equal, round, and reactive to light.  Neck: Normal range of motion. Neck supple.   Cardiovascular: Normal rate and regular rhythm.   Pulmonary/Chest: Effort normal and breath sounds normal.  Abdominal: Soft. Bowel sounds are normal. He exhibits no distension. There is no tenderness. There is no rebound and no guarding.  Neurological: He is alert and oriented to person, place, and time.  Skin: Skin is warm.  Nursing note and vitals reviewed.   ED Course  Procedures (including critical care time) Labs Review Labs Reviewed  BASIC METABOLIC PANEL - Abnormal; Notable for the following:    Potassium 3.4 (*)    Glucose, Bld 106 (*)    All other components within normal limits  TROPONIN I - Abnormal; Notable for the following:    Troponin I 0.08 (*)    All other components within normal limits  BASIC METABOLIC PANEL - Abnormal; Notable for the following:    Glucose, Bld 102 (*)    Calcium 8.3 (*)    All other components within normal limits  TROPONIN I - Abnormal; Notable for the following:    Troponin I 2.74 (*)    All other components within normal limits  TROPONIN I - Abnormal; Notable for the following:    Troponin I 10.98 (*)    All other components within normal limits  TROPONIN I - Abnormal; Notable for the following:    Troponin I 10.02 (*)    All other components within normal limits  TROPONIN I - Abnormal; Notable for the following:    Troponin I 7.31 (*)    All other components within normal limits  LIPID PANEL - Abnormal; Notable for the following:    HDL 36 (*)    All other components within normal limits  TROPONIN I - Abnormal; Notable for the following:    Troponin I 6.93 (*)    All other components within normal limits  BASIC METABOLIC PANEL - Abnormal; Notable for the following:    Glucose, Bld 100 (*)    All other components within normal limits  TROPONIN I - Abnormal; Notable for the following:    Troponin I 6.26 (*)    All other components within normal limits  I-STAT CHEM 8, ED - Abnormal; Notable for the following:    Potassium 3.4  (*)    Glucose, Bld 103 (*)    All other components within normal limits  I-STAT TROPOININ, ED - Abnormal; Notable for the following:    Troponin i, poc 0.09 (*)    All other components within normal limits  MRSA PCR SCREENING  CBC  DIFFERENTIAL  PROTIME-INR  APTT  POCT ACTIVATED CLOTTING TIME    Imaging Review No results found. I have personally reviewed and evaluated these images and lab results as part of my medical decision-making.  ED ECG REPORT   Date: 08/04/2015  Rate: 64  Rhythm: normal sinus rhythm  QRS Axis: normal  Intervals: normal  ST/T Wave abnormalities: ST elevations inferiorly and ST elevations laterally  Conduction Disutrbances:none  Narrative Interpretation:   Old EKG Reviewed: changes noted  I have personally reviewed the EKG tracing and agree with the computerized printout as noted.   MDM   Final diagnoses:  Acute ST elevation myocardial infarction (STEMI), unspecified artery    Pt with acute MI. Cath team activated. Heparin bolus, aspirin bolus ordered.  CRITICAL CARE Performed by: Varney Biles   Total critical care time: 35 minutes  Critical care time was exclusive of separately billable procedures and treating other patients.  Critical care was necessary to treat or prevent imminent or life-threatening deterioration.  Critical care was time spent personally by me on the following activities: development of treatment plan with patient and/or surrogate as well as nursing, discussions with consultants, evaluation of patient's response to treatment, examination of patient, obtaining history from patient or surrogate, ordering and performing treatments and interventions, ordering and review of laboratory studies, ordering and review of radiographic studies, pulse oximetry and re-evaluation of patient's condition.    Varney Biles, MD 08/04/15 [phone]

## 2015-08-02 ENCOUNTER — Encounter (HOSPITAL_COMMUNITY): Payer: Self-pay | Admitting: Cardiology

## 2015-08-02 ENCOUNTER — Other Ambulatory Visit (HOSPITAL_COMMUNITY): Payer: No Typology Code available for payment source

## 2015-08-02 ENCOUNTER — Inpatient Hospital Stay (HOSPITAL_COMMUNITY): Payer: No Typology Code available for payment source

## 2015-08-02 DIAGNOSIS — I2510 Atherosclerotic heart disease of native coronary artery without angina pectoris: Secondary | ICD-10-CM | POA: Diagnosis present

## 2015-08-02 DIAGNOSIS — I255 Ischemic cardiomyopathy: Secondary | ICD-10-CM | POA: Diagnosis present

## 2015-08-02 DIAGNOSIS — Z96641 Presence of right artificial hip joint: Secondary | ICD-10-CM | POA: Diagnosis present

## 2015-08-02 DIAGNOSIS — R079 Chest pain, unspecified: Secondary | ICD-10-CM | POA: Diagnosis present

## 2015-08-02 DIAGNOSIS — I080 Rheumatic disorders of both mitral and aortic valves: Secondary | ICD-10-CM | POA: Diagnosis present

## 2015-08-02 DIAGNOSIS — I2102 ST elevation (STEMI) myocardial infarction involving left anterior descending coronary artery: Secondary | ICD-10-CM | POA: Diagnosis present

## 2015-08-02 DIAGNOSIS — Z823 Family history of stroke: Secondary | ICD-10-CM | POA: Diagnosis not present

## 2015-08-02 DIAGNOSIS — Z8249 Family history of ischemic heart disease and other diseases of the circulatory system: Secondary | ICD-10-CM | POA: Diagnosis not present

## 2015-08-02 LAB — BASIC METABOLIC PANEL
Anion gap: 9 (ref 5–15)
BUN: 14 mg/dL (ref 6–20)
CHLORIDE: 106 mmol/L (ref 101–111)
CO2: 22 mmol/L (ref 22–32)
CREATININE: 1.06 mg/dL (ref 0.61–1.24)
Calcium: 8.3 mg/dL — ABNORMAL LOW (ref 8.9–10.3)
GFR calc non Af Amer: >60 mL/min (ref >60)
Glucose, Bld: 102 mg/dL — ABNORMAL HIGH (ref 65–99)
POTASSIUM: 4.6 mmol/L (ref 3.5–5.1)
Sodium: 137 mmol/L (ref 135–145)

## 2015-08-02 LAB — TROPONIN I
TROPONIN I: 10.98 ng/mL — AB (ref <0.031)
TROPONIN I: 7.31 ng/mL — AB (ref <0.031)
Troponin I: 2.74 ng/mL (ref <0.031)
Troponin I: 10.02 ng/mL (ref <0.031)

## 2015-08-02 LAB — LIPID PANEL
CHOL/HDL RATIO: 4 ratio
CHOLESTEROL: 144 mg/dL (ref 0–200)
HDL: 36 mg/dL — ABNORMAL LOW (ref >40)
LDL Cholesterol: 95 mg/dL (ref 0–99)
Triglycerides: 63 mg/dL (ref <150)
VLDL: 13 mg/dL (ref 0–40)

## 2015-08-02 LAB — POCT ACTIVATED CLOTTING TIME: ACTIVATED CLOTTING TIME: 479 s

## 2015-08-02 LAB — MRSA PCR SCREENING: MRSA BY PCR: NEGATIVE

## 2015-08-02 MED ORDER — LISINOPRIL 2.5 MG PO TABS
2.5000 mg | ORAL_TABLET | Freq: Every day | ORAL | Status: DC
  Administered 2015-08-02 – 2015-08-03 (×2): 2.5 mg via ORAL
  Filled 2015-08-02 (×2): qty 1

## 2015-08-02 MED ORDER — LIDOCAINE HCL (PF) 1 % IJ SOLN
INTRAMUSCULAR | Status: DC | PRN
  Administered 2015-08-02

## 2015-08-02 MED ORDER — MORPHINE SULFATE (PF) 2 MG/ML IV SOLN
2.0000 mg | INTRAVENOUS | Status: DC | PRN
  Administered 2015-08-02: 2 mg via INTRAVENOUS
  Filled 2015-08-02: qty 1

## 2015-08-02 MED ORDER — ATORVASTATIN CALCIUM 80 MG PO TABS
80.0000 mg | ORAL_TABLET | Freq: Every day | ORAL | Status: DC
  Administered 2015-08-02: 80 mg via ORAL
  Filled 2015-08-02: qty 1

## 2015-08-02 MED ORDER — ACETAMINOPHEN 325 MG PO TABS: 650.0000 mg | ORAL_TABLET | ORAL | Status: DC | PRN

## 2015-08-02 MED ORDER — SODIUM CHLORIDE 0.9 % WEIGHT BASED INFUSION
3.0000 mL/kg/h | INTRAVENOUS | Status: AC
  Administered 2015-08-02: 3 mL/kg/h via INTRAVENOUS

## 2015-08-02 MED ORDER — ASPIRIN 81 MG PO CHEW
81.0000 mg | CHEWABLE_TABLET | Freq: Every day | ORAL | Status: DC
  Administered 2015-08-02 – 2015-08-03 (×2): 81 mg via ORAL
  Filled 2015-08-02 (×2): qty 1

## 2015-08-02 MED ORDER — SODIUM CHLORIDE 0.9 % IJ SOLN
3.0000 mL | Freq: Two times a day (BID) | INTRAMUSCULAR | Status: DC
  Administered 2015-08-02: 3 mL via INTRAVENOUS

## 2015-08-02 MED ORDER — IOHEXOL 350 MG/ML SOLN
INTRAVENOUS | Status: DC | PRN
  Administered 2015-08-02: 170 mL via INTRA_ARTERIAL

## 2015-08-02 MED ORDER — SODIUM CHLORIDE 0.9 % IJ SOLN: 3.0000 mL | INTRAMUSCULAR | Status: DC | PRN

## 2015-08-02 MED ORDER — TICAGRELOR 90 MG PO TABS
90.0000 mg | ORAL_TABLET | Freq: Two times a day (BID) | ORAL | Status: DC
  Administered 2015-08-02 – 2015-08-03 (×3): 90 mg via ORAL
  Filled 2015-08-02 (×3): qty 1

## 2015-08-02 MED ORDER — ONDANSETRON HCL 4 MG/2ML IJ SOLN: 4.0000 mg | Freq: Four times a day (QID) | INTRAMUSCULAR | Status: DC | PRN

## 2015-08-02 MED ORDER — SODIUM CHLORIDE 0.9 % IV SOLN: 250.0000 mL | INTRAVENOUS | Status: DC | PRN

## 2015-08-02 NOTE — Interval H&P Note (Signed)
History and Physical Interval Note:  08/02/2015 12:14 AM  Bradley [name]  has presented today for surgery, with the diagnosis of Anterior STEMI. Following ASA & IV Heparin, his Anterior (~3 mm) STE had all but resolved with <0.5 mm STE & inferior ST depressions - indicating likely reperfusion. Angina reduced to 2/10 from 10/10. Despite apparent reperfusion, ischemic /injury pattern persists, therefore, we will proceed to cardiac cath lab..     The various methods of treatment have been discussed with the patient and family. After consideration of risks, benefits and other options for treatment, the patient has consented to  Procedure(s): Left Heart Cath and Coronary Angiography (N/A) Coronary Stent Intervention (N/A) as a surgical intervention .    The patient's history has been reviewed, patient examined, no change in status, stable for surgery.  I have reviewed the patient's chart and labs.  Questions were answered to the patient's satisfaction.  Emergency consent is implied, but verbal consent given.   [name] W

## 2015-08-02 NOTE — Care Management Note (Signed)
Case Management Note  Patient Details  Name: Bradley [name] MRN: [id number] Date of Birth: Aug 08, 1956  Subjective/Objective:  Pt admitted for Acute ST elevation myocardial infarction (STEMI) involving left anterior descending coronary artery. Pt will be d/c on brilinta.                   Action/Plan: Pt has 30 day free/ co pay  card. Pt will need Rx for 30 day free no refills and the original Rx with refills. CM did call [facility] and medication is available. CM unable to get co pay amount over the phone- pharmacy not able to do a test claim. Co pay card should be able to bring price to $18.00. No further needs from CM at this time.    Expected Discharge Date:                  Expected Discharge Plan:     In-House Referral:     Discharge planning Services     Post Acute Care Choice:    Choice offered to:     DME Arranged:    DME Agency:     HH Arranged:    [place] Agency:     Status of Service:     Medicare Important Message Given:    Date Medicare IM Given:    Medicare IM give by:    Date Additional Medicare IM Given:    Additional Medicare Important Message give by:     If discussed at [place] of Stay Meetings, dates discussed:    Additional Comments:  Bethena Roys, RN 08/02/2015, 4:35 PM

## 2015-08-02 NOTE — Progress Notes (Signed)
CARDIAC REHAB PHASE I  Pt ambulating independently in hallway, denies CP, dizziness, DOE. Pt is very active and exercises regularly. Completed MI/stent education with pt wife at bedside.  Reviewed risk factors, MI book, anti-platelet therapy, stent card, activity restrictions, ntg, exercise, heart healthy diet, sodium restrictions, daily weights and phase 2 cardiac rehab. Pt verbalized understanding. Pt agrees to phase 2 cardiac rehab referral, will send to [facility]. Pt encouraged to ambulate as tolerated. Will follow-up tomorrow.  [phone]   Lenna Sciara, RN, BSN 08/02/2015 11:05 AM

## 2015-08-02 NOTE — Progress Notes (Signed)
Echocardiogram 2D Echocardiogram has been performed.  Tresa Res 08/02/2015, 4:11 PM

## 2015-08-02 NOTE — H&P (View-Only) (Signed)
Referring Physician: ER Primary Cardiologist: Minus Breeding, MD -- seen by him on 05/30/2015 Reason for Consultation: STEMI   HPI: 59 yo [place] man with family history of CAD, recent low risk ETT in 05/2015 and low CAC score, presents with crushing substernal chest pressure, diaphoresis, SOB, dizziness. Symptoms started about 45 minutes prior to arrival. No syncope, bleeding, N/V, fever. In the ER, [facility] suggested anterolateral STEMI, SR, normal AV conduction, narrow QRS. SBP in 130-140;s, NSR on tele, normal o2 sats. Given ASA and Heparin bolus and sl NTG with significant relief of symptoms and ST changes.     Review of Systems:  10 systems reviewed unremarkable except as noted in HPI   History reviewed. No pertinent past medical history.  No prescriptions prior to admission   Allergies:  NKDA   Meds:  Does not take any meds      Infusions: . heparin      No Known Allergies  Social History   Social History  . Marital Status: Married    Spouse Name: N/A  . Number of Children: 4  . Years of Education: N/A   Occupational History  . Facilities manager    Social History Main Topics  . Smoking status: Never Smoker   . Smokeless tobacco: Never Used  . Alcohol Use: 0.0 oz/week    2-3 Cans of beer per week     Comment: occasion  . Drug Use: No  . Sexual Activity: Not on file   Other Topics Concern  . Not on file   Social History Narrative   Lives at home with wife.     Family History  Problem Relation Age of Onset  . Stroke Mother 16  . Cancer Mother     breast  . CAD Father 17    CABG  . CAD Sister 66  . Cancer Brother     testicular cancer  . Hyperlipidemia Brother   . Hyperlipidemia Brother   . CAD Mother 60    PHYSICAL EXAM: Filed Vitals:   08/01/15 2245  BP:   Pulse: 60  Temp:   Resp: 12    No intake or output data in the 24 hours ending 08/01/15 2308  General:  Well appearing. Anxious, diaphoretic HEENT: normal Neck: supple. no JVD.  Carotids 2+ bilat; no bruits. No lymphadenopathy or thryomegaly appreciated. Cor: PMI nondisplaced. Regular rate & rhythm. No rubs, gallops or murmurs. Lungs: clear Abdomen: soft, nontender, nondistended. No hepatosplenomegaly. No bruits or masses. Good bowel sounds. Extremities: no cyanosis, clubbing, rash, edema Neuro: alert & oriented x 3, cranial nerves grossly intact. moves all 4 extremities w/o difficulty. Affect pleasant.    Results for orders placed or performed during the hospital encounter of 08/01/15 (from the past 24 hour(s))  I-stat troponin, ED     Status: Abnormal   Collection Time: 08/01/15 10:24 PM  Result Value Ref Range   Troponin i, poc 0.09 (HH) 0.00 - 0.08 ng/mL   Comment NOTIFIED PHYSICIAN    Comment 3          I-Stat Chem 8, ED  (not at [facility], [facility])     Status: Abnormal   Collection Time: 08/01/15 10:26 PM  Result Value Ref Range   Sodium 143 135 - 145 mmol/L   Potassium 3.4 (L) 3.5 - 5.1 mmol/L   Chloride 105 101 - 111 mmol/L   BUN 17 6 - 20 mg/dL   Creatinine, Ser 1.20 0.61 - 1.24 mg/dL   Glucose, Bld  103 (H) 65 - 99 mg/dL   Calcium, Ion 1.17 1.13 - 1.30 mmol/L   TCO2 22 0 - 100 mmol/L   Hemoglobin 16.7 13.0 - 17.0 g/dL   HCT 49.0 39.0 - 52.0 %  CBC     Status: None   Collection Time: 08/01/15 10:27 PM  Result Value Ref Range   WBC 9.2 4.0 - 10.5 K/uL   RBC 4.69 4.22 - 5.81 MIL/uL   Hemoglobin 15.1 13.0 - 17.0 g/dL   HCT 44.8 39.0 - 52.0 %   MCV 95.5 78.0 - 100.0 fL   MCH 32.2 26.0 - 34.0 pg   MCHC 33.7 30.0 - 36.0 g/dL   RDW 13.0 11.5 - 15.5 %   Platelets 160 150 - 400 K/uL  Differential     Status: None   Collection Time: 08/01/15 10:27 PM  Result Value Ref Range   Neutrophils Relative % 60 43 - 77 %   Neutro Abs 5.5 1.7 - 7.7 K/uL   Lymphocytes Relative 30 12 - 46 %   Lymphs Abs 2.8 0.7 - 4.0 K/uL   Monocytes Relative 8 3 - 12 %   Monocytes Absolute 0.7 0.1 - 1.0 K/uL   Eosinophils Relative 2 0 - 5 %   Eosinophils Absolute 0.2 0.0 - 0.7  K/uL   Basophils Relative 0 0 - 1 %   Basophils Absolute 0.0 0.0 - 0.1 K/uL  Protime-INR     Status: None   Collection Time: 08/01/15 10:27 PM  Result Value Ref Range   Prothrombin Time 13.7 11.6 - 15.2 seconds   INR 1.03 0.00 - 1.49  APTT     Status: None   Collection Time: 08/01/15 10:27 PM  Result Value Ref Range   aPTT 27 24 - 37 seconds  Basic metabolic panel     Status: Abnormal   Collection Time: 08/01/15 10:27 PM  Result Value Ref Range   Sodium 141 135 - 145 mmol/L   Potassium 3.4 (L) 3.5 - 5.1 mmol/L   Chloride 105 101 - 111 mmol/L   CO2 24 22 - 32 mmol/L   Glucose, Bld 106 (H) 65 - 99 mg/dL   BUN 14 6 - 20 mg/dL   Creatinine, Ser 1.19 0.61 - 1.24 mg/dL   Calcium 9.5 8.9 - 10.3 mg/dL   GFR calc non Af Amer >60 >60 mL/min   GFR calc Af Amer >60 >60 mL/min   Anion gap 12 5 - 15  Troponin I     Status: Abnormal   Collection Time: 08/01/15 10:27 PM  Result Value Ref Range   Troponin I 0.08 (H) <0.031 ng/mL   Dg Chest Portable 1 View  08/01/2015   CLINICAL DATA:  ST-elevation myocardial infarction  EXAM: PORTABLE CHEST - 1 VIEW  COMPARISON:  Prior stud  FINDINGS: Mild cardiac enlargement. 5 mm granuloma left mid lung zone. Vascular pattern normal. Lungs clear.  IMPRESSION: No active disease.   Electronically Signed   By: Skipper Cliche M.D.   On: 08/01/2015 23:05     ASSESSMENT:  1. STEMI, anterolateral  - normal BP and rhythm   PLAN/DISCUSSION:  To cath lab  Discussed with interventional cardiology and ER attending.   Wandra Mannan, MD Cardiology

## 2015-08-02 NOTE — Progress Notes (Signed)
Patient Name: Bradley [name] Date of Encounter: 08/02/2015  Principal Problem:   Acute ST elevation myocardial infarction (STEMI) involving left anterior descending coronary artery Active Problems:   Cardiomyopathy, ischemic: EF 40-45% by LV Gram in setting of Anterior STEMI   Length of Stay: 0  SUBJECTIVE  Asymptomatic, feels great.  CURRENT MEDS . aspirin  81 mg Oral Daily  . atorvastatin  80 mg Oral q1800  . lisinopril  2.5 mg Oral Daily  . sodium chloride  3 mL Intravenous Q12H  . ticagrelor  90 mg Oral BID    OBJECTIVE   Intake/Output Summary (Last 24 hours) at 08/02/15 0927 Last data filed at 08/02/15 0700  Gross per 24 hour  Intake 783.36 ml  Output    875 ml  Net -91.64 ml   Filed Weights   08/01/15 2213 08/01/15 2220 08/02/15 0203  Weight: 180 lb (81.647 kg) 180 lb (81.647 kg) 183 lb 6.8 oz (83.2 kg)    PHYSICAL EXAM Filed Vitals:   08/02/15 0500 08/02/15 0600 08/02/15 0700 08/02/15 0800  BP: 118/81 122/85 128/81 131/84  Pulse: 52 51 50 57  Temp:   97.5 F (36.4 C)   TempSrc:   Oral   Resp: 14 21 12 14   Height:      Weight:      SpO2: 98% 99% 100% 100%   General: Alert, oriented x3, no distress Head: no evidence of trauma, PERRL, EOMI, no exophtalmos or lid lag, no myxedema, no xanthelasma; normal ears, nose and oropharynx Neck: normal jugular venous pulsations and no hepatojugular reflux; brisk carotid pulses without delay and no carotid bruits Chest: clear to auscultation, no signs of consolidation by percussion or palpation, normal fremitus, symmetrical and full respiratory excursions Cardiovascular: normal position and quality of the apical impulse, regular rhythm, normal first and second heart sounds, no rubs or gallops, no murmur Abdomen: no tenderness or distention, no masses by palpation, no abnormal pulsatility or arterial bruits, normal bowel sounds, no hepatosplenomegaly Extremities: no clubbing, cyanosis or edema; 2+ radial, ulnar and  brachial pulses bilaterally; 2+ right femoral, posterior tibial and dorsalis pedis pulses; 2+ left femoral, posterior tibial and dorsalis pedis pulses; no subclavian or femoral bruits Neurological: grossly nonfocal  LABS  CBC  Recent Labs  08/01/15 2226 08/01/15 2227  WBC  --  9.2  NEUTROABS  --  5.5  HGB 16.7 15.1  HCT 49.0 44.8  MCV  --  95.5  PLT  --  921   Basic Metabolic Panel  Recent Labs  08/01/15 2227 08/02/15 0529  NA 141 137  K 3.4* 4.6  CL 105 106  CO2 24 22  GLUCOSE 106* 102*  BUN 14 14  CREATININE 1.19 1.06  CALCIUM 9.5 8.3*   Liver Function Tests No results for input(s): AST, ALT, ALKPHOS, BILITOT, PROT, ALBUMIN in the last 72 hours. No results for input(s): LIPASE, AMYLASE in the last 72 hours. Cardiac Enzymes  Recent Labs  08/01/15 2227 08/02/15 0122 08/02/15 0529  TROPONINI 0.08* 2.74* 10.98*    Radiology Studies Imaging results have been reviewed and Dg Chest Portable 1 View  08/01/2015   CLINICAL DATA:  ST-elevation myocardial infarction  EXAM: PORTABLE CHEST - 1 VIEW  COMPARISON:  Prior stud  FINDINGS: Mild cardiac enlargement. 5 mm granuloma left mid lung zone. Vascular pattern normal. Lungs clear.  IMPRESSION: No active disease.   Electronically Signed   By: Skipper Cliche M.D.   On: 08/01/2015 23:05    TELE NSR  ECG NSR, barely noticeable nonspecific ST changes in V5-V6  ASSESSMENT AND PLAN  Excellent recovery after early PCI for extensive anterior STEMI due to prox-mid LAD stenosis. Suspect most of anteroapical hypokinesis and depressed LVEF represents stunning rather than true necrosis and that we will see substantial functional recovery. Add low dose ACE inh. Beta blocker use limited by bradycardia (likely due to athletic fitness). Echo pending. Discussed DAPT, statins. Probable DC in AM.   Sanda Klein, MD, [facility] [phone] office [phone] pager 08/02/2015 9:27 AM

## 2015-08-03 LAB — BASIC METABOLIC PANEL
ANION GAP: 8 (ref 5–15)
BUN: 11 mg/dL (ref 6–20)
CALCIUM: 9 mg/dL (ref 8.9–10.3)
CO2: 25 mmol/L (ref 22–32)
Chloride: 107 mmol/L (ref 101–111)
Creatinine, Ser: 0.99 mg/dL (ref 0.61–1.24)
GFR calc Af Amer: >60 mL/min (ref >60)
GFR calc non Af Amer: >60 mL/min (ref >60)
GLUCOSE: 100 mg/dL — AB (ref 65–99)
Potassium: 4.3 mmol/L (ref 3.5–5.1)
Sodium: 140 mmol/L (ref 135–145)

## 2015-08-03 LAB — TROPONIN I
Troponin I: 6.93 ng/mL (ref <0.031)
Troponin I: 6.26 ng/mL (ref <0.031)

## 2015-08-03 MED ORDER — TICAGRELOR 90 MG PO TABS: 90.0000 mg | ORAL_TABLET | Freq: Two times a day (BID) | ORAL | Status: DC

## 2015-08-03 MED ORDER — ASPIRIN 81 MG PO CHEW: 81.0000 mg | CHEWABLE_TABLET | Freq: Every day | ORAL | Status: DC

## 2015-08-03 MED ORDER — LISINOPRIL 2.5 MG PO TABS: 2.5000 mg | ORAL_TABLET | Freq: Every day | ORAL | Status: DC

## 2015-08-03 MED ORDER — ATORVASTATIN CALCIUM 80 MG PO TABS: 80.0000 mg | ORAL_TABLET | Freq: Every day | ORAL | Status: DC

## 2015-08-03 MED ORDER — METOPROLOL SUCCINATE ER 25 MG PO TB24: 12.5000 mg | ORAL_TABLET | Freq: Every day | ORAL | Status: DC

## 2015-08-03 MED ORDER — NITROGLYCERIN 0.4 MG SL SUBL: 0.4000 mg | SUBLINGUAL_TABLET | SUBLINGUAL | Status: DC | PRN

## 2015-08-03 MED ORDER — METOPROLOL SUCCINATE ER 25 MG PO TB24
12.5000 mg | ORAL_TABLET | Freq: Every day | ORAL | Status: DC
  Administered 2015-08-03: 12.5 mg via ORAL
  Filled 2015-08-03 (×2): qty 1

## 2015-08-03 NOTE — Discharge Instructions (Signed)
Radial Site Care °Refer to this sheet in the next few weeks. These instructions provide you with information on caring for yourself after your procedure. Your caregiver may also give you more specific instructions. Your treatment has been planned according to current medical practices, but problems sometimes occur. Call your caregiver if you have any problems or questions after your procedure. °HOME CARE INSTRUCTIONS °· You may shower the day after the procedure. Remove the bandage (dressing) and gently wash the site with plain soap and water. Gently pat the site dry. °· Do not apply powder or lotion to the site. °· Do not submerge the affected site in water for 3 to 5 days. °· Inspect the site at least twice daily. °· Do not flex or bend the affected arm for 24 hours. °· No lifting over 5 pounds (2.3 kg) for 5 days after your procedure. °· Do not drive home if you are discharged the same day of the procedure. Have someone else drive you. °· You may drive 24 hours after the procedure unless otherwise instructed by your caregiver. °· Do not operate machinery or power tools for 24 hours. °· A responsible adult should be with you for the first 24 hours after you arrive home. °What to expect: °· Any bruising will usually fade within 1 to 2 weeks. °· Blood that collects in the tissue (hematoma) may be painful to the touch. It should usually decrease in size and tenderness within 1 to 2 weeks. °SEEK IMMEDIATE MEDICAL CARE IF: °· You have unusual pain at the radial site. °· You have redness, warmth, swelling, or pain at the radial site. °· You have drainage (other than a small amount of blood on the dressing). °· You have chills. °· You have a fever or persistent symptoms for more than 72 hours. °· You have a fever and your symptoms suddenly get worse. °· Your arm becomes pale, cool, tingly, or numb. °· You have heavy bleeding from the site. Hold pressure on the site. °Document Released: 12/12/2010 Document Revised:  02/01/2012 Document Reviewed: 12/12/2010 °ExitCare® Patient Information ©2015 [employer]. This information is not intended to replace advice given to you by your health care provider. Make sure you discuss any questions you have with your health care provider. ° °

## 2015-08-03 NOTE — Progress Notes (Signed)
Pt refused bed alarm. Pt educated. Will continue to monitor.   Albertina Senegal E

## 2015-08-03 NOTE — Progress Notes (Signed)
Patient Name: Bradley [name] Date of Encounter: 08/03/2015     Principal Problem:   Acute ST elevation myocardial infarction (STEMI) involving left anterior descending coronary artery Active Problems:   Cardiomyopathy, ischemic: EF 40-45% by LV Gram in setting of Anterior STEMI    SUBJECTIVE  No chest pain.  The patient has been walking in the hall without difficulty.  He is agreeable to phase 2 cardiac rehabilitation outpatient referral.   CURRENT MEDS . aspirin  81 mg Oral Daily  . atorvastatin  80 mg Oral q1800  . lisinopril  2.5 mg Oral Daily  . metoprolol succinate  12.5 mg Oral Daily  . sodium chloride  3 mL Intravenous Q12H  . ticagrelor  90 mg Oral BID    OBJECTIVE  Filed Vitals:   08/02/15 0800 08/02/15 1128 08/02/15 2044 08/03/15 0500  BP: 131/84 137/85 114/77 122/73  Pulse: 57 54 56 75  Temp:  97.6 F (36.4 C) 98.7 F (37.1 C) 98.5 F (36.9 C)  TempSrc:  Oral Oral Oral  Resp: 14 18 18 18   Height:  5\' 11"  (1.803 m)    Weight:  183 lb 6.4 oz (83.19 kg)  179 lb 9.6 oz (81.466 kg)  SpO2: 100% 100% 100% 99%    Intake/Output Summary (Last 24 hours) at 08/03/15 1308 Last data filed at 08/02/15 1000  Gross per 24 hour  Intake    480 ml  Output      0 ml  Net    480 ml   Filed Weights   08/02/15 0203 08/02/15 1128 08/03/15 0500  Weight: 183 lb 6.8 oz (83.2 kg) 183 lb 6.4 oz (83.19 kg) 179 lb 9.6 oz (81.466 kg)    PHYSICAL EXAM  General: Pleasant, NAD. Neuro: Alert and oriented X 3. Moves all extremities spontaneously. Psych: Normal affect. HEENT:  Normal  Neck: Supple without bruits or JVD. Lungs:  Resp regular and unlabored, CTA. Heart: RRR no s3, s4, or murmurs. Abdomen: Soft, non-tender, non-distended, BS + x 4.  Extremities: No clubbing, cyanosis or edema. DP/PT/Radials 2+ and equal bilaterally.  Accessory Clinical Findings  CBC  Recent Labs  08/01/15 2226 08/01/15 2227  WBC  --  9.2  NEUTROABS  --  5.5  HGB 16.7 15.1  HCT 49.0  44.8  MCV  --  95.5  PLT  --  657   Basic Metabolic Panel  Recent Labs  08/02/15 0529 08/03/15 0610  NA 137 140  K 4.6 4.3  CL 106 107  CO2 22 25  GLUCOSE 102* 100*  BUN 14 11  CREATININE 1.06 0.99  CALCIUM 8.3* 9.0   Liver Function Tests No results for input(s): AST, ALT, ALKPHOS, BILITOT, PROT, ALBUMIN in the last 72 hours. No results for input(s): LIPASE, AMYLASE in the last 72 hours. Cardiac Enzymes  Recent Labs  08/02/15 1845 08/02/15 2357 08/03/15 0610  TROPONINI 7.31* 6.93* 6.26*   BNP Invalid input(s): POCBNP D-Dimer No results for input(s): DDIMER in the last 72 hours. Hemoglobin A1C No results for input(s): HGBA1C in the last 72 hours. Fasting Lipid Panel  Recent Labs  08/02/15 1525  CHOL 144  HDL 36*  LDLCALC 95  TRIG 63  CHOLHDL 4.0   Thyroid Function Tests No results for input(s): TSH, T4TOTAL, T3FREE, THYROIDAB in the last 72 hours.  Invalid input(s): FREET3  TELE  Normal sinus rhythm.  Occasional interpolated PVCs and paired PVCs  ECG  02-Aug-2015 06:45:35 [facility] ROUTINE RECORD Sinus bradycardia  Otherwise normal ECG No significant change since earlier same day Confirmed by [facility] MD, PETER [phone]) on 08/02/2015 10:15:47 AM  Radiology/Studies  Dg Chest Portable 1 View  08/01/2015   CLINICAL DATA:  ST-elevation myocardial infarction  EXAM: PORTABLE CHEST - 1 VIEW  COMPARISON:  Prior stud  FINDINGS: Mild cardiac enlargement. 5 mm granuloma left mid lung zone. Vascular pattern normal. Lungs clear.  IMPRESSION: No active disease.   Electronically Signed   By: Skipper Cliche M.D.   On: 08/01/2015 23:05    ASSESSMENT AND PLAN  1.  Acute STEMI involving the LAD 2.  Mild left ventricular systolic dysfunction.  EF 40-45% by LV gram and 35-40% by echocardiogram. 3.  Moderate aortic insufficiency and mild mitral regurgitation by echocardiogram   Disposition: His heart rate has improved enough that we can start  him on low-dose Toprol 12.5 mg daily. He is okay for discharge today.  He is interested in the phase II cardiac rehabilitation program. He wondered about taking a trip to [place] later this week.  I told him that he should stay in town until he is seen in follow-up by Dr. Percival Spanish in the next one to 2 weeks.  Discharge on aspirin, Brilinta, beta blocker, ACE inhibitor, statin  Signed, Darlin Coco MD

## 2015-08-03 NOTE — Discharge Summary (Signed)
Physician Discharge Summary  Patient ID: GERAN HAITHCOCK MRN: [id number] DOB/AGE: 29-Dec-1955 59 y.o.   Primary Cardiologist: Dr. Percival Spanish  Admit date: 08/01/2015 Discharge date: 08/03/2015  Admission Diagnoses: STEMI  Discharge Diagnoses:  Principal Problem:   Acute ST elevation myocardial infarction (STEMI) involving left anterior descending coronary artery Active Problems:   Cardiomyopathy, ischemic: EF 40-45% by LV Gram in setting of Anterior STEMI   Discharged Condition: stable  Hospital Course: 59 year old otherwise healthy gentleman on no medications with no significant medical history. He has a significant family history of CAD prematurely in both parents as well as his sister.   He presented to [facility] on 08/01/15 with chest pain. EKG showed ST elevations and Code STEMI was activated. Troponin peaked at 10.02. He was taken urgently to the [facility] cath lab. The procedure was performed by Dr. Ellyn Hack. 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 Both lesions were covered with one single stent. There was moderate left ventricular systolic dysfunction with expected mid to apical Anterior-Anterolateral Hypokinesis. He tolerated the procedure well and left the cath lab in stable condition. His CP resolved. EF was 35-40% by echo.  He was placed on DAPT with ASA + Brilinta. He was also placed on a statin, BB and lisinopril. He had no post cath complications. He was last seen and examined by Dr. Mare Ferrari who determined he was stable for discharge home. He will f/u with Dr. Percival Spanish in 1-2 weeks.   Consults: None  Significant Diagnostic Studies:  LHC Conclusion    1. Mid LAD lesion, 99% stenosed followed by Dist LAD lesion, 60% stenosed. A Xience Alpine 3.5 mm x 33 mm drug-eluting stent was placed covering both lesions . There is a 0% residual stenosis post intervention. 2. There is moderate left ventricular  systolic dysfunction with expected mid to apical Anterior-Anterolateral Hypokinesis 3. Dist RCA lesion, 30% stenosed.   Severe single vessel CAD with subtotal occlusion of the mid LAD at D2 with downstream 60% disease prior to D3. Both lesions covered with one single stent. He has likely stunned myocardium with moderately reduced EF hypokinesis in the LAD distribution.  He likely had an aborted anterior STEMI after treatment with aspirin and heparin as the 99% lesion was thrombotic and the 60% lesion was somewhat hazy in appearance suggesting some thrombosis as well.  Plan:  Admit to CCU for standard post radial PCI monitoring.  Dual antiplatelet therapy for minimum one year. As this is an LAD lesion, would like to continue antiplatelet coverage beyond that.   Check 2-D echocardiogram to assess extent of cardiomyopathy   At this point with borderline low blood pressures following radial cocktail administration, I have not started any antihypertensives agents. Would be leery of beta blocker based on baseline bradycardia.  High-dose statin  Cardiac rehabilitation consultation   If stable, could consider fast-track discharge pending echocardiographic evaluation     Treatments: See Hospital Course  Discharge Exam: Blood pressure 122/73, pulse 75, temperature 98.5 F (36.9 C), temperature source Oral, resp. rate 18, height 5\' 11"  (1.803 m), weight 179 lb 9.6 oz (81.466 kg), SpO2 99 %.   Disposition: 01-Home or Self Care      Discharge Instructions    Amb Referral to Cardiac Rehabilitation    Complete by:  As directed   Congestive Heart Failure: If diagnosis is Heart Failure, patient MUST meet each of the CMS criteria: 1. Left  Ventricular Ejection Fraction </= 35% 2. NYHA class II-IV symptoms despite being on optimal heart failure therapy for at least 6 weeks. 3. Stable = have not had a recent (<6 weeks) or planned (<6 months) major cardiovascular hospitalization or  procedure  Program Details: - Physician supervised classes - 1-3 classes per week over a 12-18 week period, generally for a total of 36 sessions  Physician Certification: I certify that the above Cardiac Rehabilitation treatment is medically necessary and is medically approved by me for treatment of this patient. The patient is willing and cooperative, able to ambulate and medically stable to participate in exercise rehabilitation. The participant's progress and Individualized Treatment Plan will be reviewed by the Medical Director, Cardiac Rehab staff and as indicated by the Referring/Ordering Physician.  Diagnosis:   Myocardial Infarction PCI       Diet - low sodium heart healthy    Complete by:  As directed      Increase activity slowly    Complete by:  As directed             Medication List    TAKE these medications        aspirin 81 MG chewable tablet  Chew 1 tablet (81 mg total) by mouth daily.     atorvastatin 80 MG tablet  Commonly known as:  LIPITOR  Take 1 tablet (80 mg total) by mouth daily at 6 PM.     lisinopril 2.5 MG tablet  Commonly known as:  PRINIVIL,ZESTRIL  Take 1 tablet (2.5 mg total) by mouth daily.     metoprolol succinate 25 MG 24 hr tablet  Commonly known as:  TOPROL-XL  Take 0.5 tablets (12.5 mg total) by mouth daily.     nitroGLYCERIN 0.4 MG SL tablet  Commonly known as:  NITROSTAT  Place 1 tablet (0.4 mg total) under the tongue every 5 (five) minutes as needed for chest pain.     ticagrelor 90 MG Tabs tablet  Commonly known as:  BRILINTA  Take 1 tablet (90 mg total) by mouth 2 (two) times daily.     ticagrelor 90 MG Tabs tablet  Commonly known as:  BRILINTA  Take 1 tablet (90 mg total) by mouth 2 (two) times daily.       Follow-up Information    Follow up with Dr. Percival Spanish   Specialty:  Cardiology   Why:  our office will call you with a f/u appointment   Contact information:   [address] [place]  [address] [phone]     TIME SPENT ON DISCHARGE, [place]: >30 MINUTES  Signed: Lyda Jester 08/03/2015, 12:24 PM

## 2015-08-05 ENCOUNTER — Telehealth: Payer: Self-pay | Admitting: Cardiology

## 2015-08-05 NOTE — Telephone Encounter (Signed)
TCM for 9/12

## 2015-08-05 NOTE — Telephone Encounter (Signed)
Talked with patient.  Aware of appointment with Cecilie Kicks 9/21 and knows to bring all his medications with him.  Does not have any questions about his DC instructions.  Informed him of who his cardiologist will be.

## 2015-08-05 NOTE — Telephone Encounter (Signed)
No device set up to leave VM to call back

## 2015-08-05 NOTE — Telephone Encounter (Signed)
TCM Phone call. Appt on 08/14/15 at 10:30w/ Cecilie Kicks     Thanks

## 2015-08-06 ENCOUNTER — Telehealth: Payer: Self-pay

## 2015-08-06 NOTE — Telephone Encounter (Signed)
Prior auth sent to [facility] for Brilinta 90mg  via Cover My Meds.

## 2015-08-12 ENCOUNTER — Encounter: Payer: Self-pay | Admitting: Cardiology

## 2015-08-13 NOTE — Progress Notes (Signed)
   Cardiology Office Note   Pt was switched to [facility] schedule  I never saw the pt.

## 2015-08-14 ENCOUNTER — Encounter: Payer: Self-pay | Admitting: Cardiology

## 2015-08-14 ENCOUNTER — Ambulatory Visit (INDEPENDENT_AMBULATORY_CARE_PROVIDER_SITE_OTHER): Payer: No Typology Code available for payment source | Admitting: Cardiology

## 2015-08-14 ENCOUNTER — Ambulatory Visit (INDEPENDENT_AMBULATORY_CARE_PROVIDER_SITE_OTHER): Payer: Self-pay | Admitting: Cardiology

## 2015-08-14 VITALS — BP 122/80 | HR 48 | Ht 71.0 in | Wt 176.9 lb

## 2015-08-14 DIAGNOSIS — I2102 ST elevation (STEMI) myocardial infarction involving left anterior descending coronary artery: Secondary | ICD-10-CM

## 2015-08-14 DIAGNOSIS — Z9861 Coronary angioplasty status: Secondary | ICD-10-CM

## 2015-08-14 DIAGNOSIS — I255 Ischemic cardiomyopathy: Secondary | ICD-10-CM

## 2015-08-14 DIAGNOSIS — I2510 Atherosclerotic heart disease of native coronary artery without angina pectoris: Secondary | ICD-10-CM

## 2015-08-14 DIAGNOSIS — I213 ST elevation (STEMI) myocardial infarction of unspecified site: Secondary | ICD-10-CM

## 2015-08-14 DIAGNOSIS — Z8249 Family history of ischemic heart disease and other diseases of the circulatory system: Secondary | ICD-10-CM

## 2015-08-14 DIAGNOSIS — I2129 ST elevation (STEMI) myocardial infarction involving other sites: Secondary | ICD-10-CM

## 2015-08-14 DIAGNOSIS — E785 Hyperlipidemia, unspecified: Secondary | ICD-10-CM

## 2015-08-14 MED ORDER — LISINOPRIL 5 MG PO TABS: 5.0000 mg | ORAL_TABLET | Freq: Every day | ORAL | Status: DC

## 2015-08-14 NOTE — Assessment & Plan Note (Signed)
Anterior STEMI 08/01/15. No further chest pain

## 2015-08-14 NOTE — Assessment & Plan Note (Signed)
Multiple immediate family members with early CAD.

## 2015-08-14 NOTE — Patient Instructions (Signed)
Your physician has requested that you have an echocardiogram. Echocardiography is a painless test that uses sound waves to create images of your heart. It provides your doctor with information about the size and shape of your heart and how well your heart's chambers and valves are working. This procedure takes approximately one hour. There are no restrictions for this procedure. This will be done in 3 months.  Your physician recommends that you schedule a follow-up appointment in: 3 months with Dr. Percival Spanish.

## 2015-08-14 NOTE — Progress Notes (Signed)
08/14/2015 Bradley [name]   Jul 03, 1956  [id number]  Primary Physician Wendie Agreste, MD Primary Cardiologist: Dr Percival Spanish  HPI:  59 y/o male, seen by Dr Percival Spanish in July for cardiac evaluation secondary to strong FM Hx of CAD. The pt had no symptoms then and was very active and otherwise healthy. He had a negative treadmill then. On 08/01/15 he presented with an acute anterior STEMI.  At cath he had a 99% mid LAD lesion that was treated with a DES (Dr Ellyn Hack). The pt had 30% distal RCA narrowing as well. His EF was 40-45% at cath, 35-40% by echo done the next day. He is in the office today for follow up. He and his wife had several questions and had actually written a letter before the visit to be reviewed at this visit. I tried to answer all they're questions to the best of my ability. The pt is doing well, he denies any chest pain or SOB. He has resumed light exercise, walking and light biking. He is back to work but does not have a physically strenuous job.    Current Outpatient Prescriptions  Medication Sig Dispense Refill  . aspirin 81 MG chewable tablet Chew 1 tablet (81 mg total) by mouth daily.    Marland Kitchen atorvastatin (LIPITOR) 80 MG tablet Take 1 tablet (80 mg total) by mouth daily at 6 PM. 30 tablet 5  . metoprolol succinate (TOPROL-XL) 25 MG 24 hr tablet Take 0.5 tablets (12.5 mg total) by mouth daily. 30 tablet 5  . nitroGLYCERIN (NITROSTAT) 0.4 MG SL tablet Place 1 tablet (0.4 mg total) under the tongue every 5 (five) minutes as needed for chest pain. 25 tablet 2  . ticagrelor (BRILINTA) 90 MG TABS tablet Take 1 tablet (90 mg total) by mouth 2 (two) times daily. 60 tablet 10  . lisinopril (PRINIVIL,ZESTRIL) 5 MG tablet Take 1 tablet (5 mg total) by mouth daily. 30 tablet 6   No current facility-administered medications for this visit.    No Known Allergies  Social History   Social History  . Marital Status: Married    Spouse Name: N/A  . Number of Children: 4  . Years of  Education: N/A   Occupational History  . Facilities manager    Social History Main Topics  . Smoking status: Never Smoker   . Smokeless tobacco: Never Used  . Alcohol Use: 0.0 oz/week    2-3 Cans of beer per week     Comment: occasion  . Drug Use: No  . Sexual Activity: Not on file   Other Topics Concern  . Not on file   Social History Narrative   Lives at home with wife.      Review of Systems: General: negative for chills, fever, night sweats or weight changes.  Cardiovascular: negative for chest pain, dyspnea on exertion, edema, orthopnea, palpitations, paroxysmal nocturnal dyspnea or shortness of breath Dermatological: negative for rash Respiratory: negative for cough or wheezing Urologic: negative for hematuria Abdominal: negative for nausea, vomiting, diarrhea, bright red blood per rectum, melena, or hematemesis Neurologic: negative for visual changes, syncope, or dizziness All other systems reviewed and are otherwise negative except as noted above.    Blood pressure 122/80, pulse 48, height 5\' 11"  (1.803 m), weight 176 lb 14.4 oz (80.241 kg).  General appearance: alert, cooperative and no distress Neck: no carotid bruit and no JVD Lungs: clear to auscultation bilaterally Heart: regular rate and rhythm Abdomen: soft, non-tender; bowel sounds normal; no masses,  no organomegaly Extremities: extremities normal, atraumatic, no cyanosis or edema Pulses: 2+ and symmetric Skin: Skin color, texture, turgor normal. No rashes or lesions Neurologic: Grossly normal  EKG NSR, SB-48, Lateral TWI  ASSESSMENT AND PLAN:   Acute ST elevation myocardial infarction (STEMI) involving left anterior descending coronary artery Anterior STEMI 08/01/15. No further chest pain  CAD S/P LAD DES 08/01/15  Cardiomyopathy, ischemic:  EF 40-45% by LV Gram, 35-40% by echo in setting of Anterior STEMI  Family history of early CAD Multiple immediate family members with early  CAD.  Dyslipidemia On high dose statin post MI   PLAN  I suggested the pt have a repeat echo for LVF in 3 months. I also suggested we increase his Lisinopril to 5 mg daily. I encouraged him to continue his high dose statin, ASA, low dose beta blocker, and Brilinta. They asked about a [facility] lipid profile and discouraged this as I did not feel it would change his current treatment. They can review that with Dr Percival Spanish when they see him in 3 months. He asked about strenuous physical activity and I suggested he hold off for at least 6 weeks, then ease back into his usual routine.   Kerin Ransom K PA-C 08/14/2015 1:04 PM

## 2015-08-14 NOTE — Assessment & Plan Note (Signed)
EF 40-45% by LV Gram, 35-40% by echo in setting of Anterior STEMI

## 2015-08-14 NOTE — Assessment & Plan Note (Signed)
08/01/15 

## 2015-08-14 NOTE — Assessment & Plan Note (Signed)
On high dose statin post MI

## 2015-08-16 ENCOUNTER — Encounter: Payer: Self-pay | Admitting: Cardiology

## 2015-08-19 ENCOUNTER — Telehealth: Payer: Self-pay

## 2015-08-19 NOTE — Telephone Encounter (Signed)
Just made aware today, that Brilinta will not be covered by [facility]. They said because he hadn't tried and failed Clopidogrel first. I could appeal, but need your input.

## 2015-08-22 ENCOUNTER — Telehealth (HOSPITAL_COMMUNITY): Payer: Self-pay

## 2015-08-22 NOTE — Telephone Encounter (Signed)
OK to stop Brilinta and start Plavix 75 mg po daily.  Disp number 90 with 3 refills.  Sig one po daily.

## 2015-08-22 NOTE — Telephone Encounter (Signed)
Pt denied [facility] services at this time. Pt is walking 2 miles at a 15 minute pace 2-3x/week. Pt will contact [facility] if anything changes.

## 2015-08-23 ENCOUNTER — Telehealth: Payer: Self-pay | Admitting: *Deleted

## 2015-08-23 MED ORDER — CLOPIDOGREL BISULFATE 75 MG PO TABS: 75.0000 mg | ORAL_TABLET | Freq: Every day | ORAL | Status: DC

## 2015-08-23 NOTE — Telephone Encounter (Signed)
Signed order was faxed back to [facility] cardiac Re[facility] Phase II

## 2015-08-23 NOTE — Telephone Encounter (Signed)
Spoke with pt and let him know it's ok to stop Brilinta and start Plavix #90 3 refills was send to the [facility] on [address]

## 2015-08-27 ENCOUNTER — Encounter: Payer: Self-pay | Admitting: Cardiology

## 2015-11-04 ENCOUNTER — Ambulatory Visit (HOSPITAL_COMMUNITY): Payer: No Typology Code available for payment source | Attending: Cardiology

## 2015-11-04 ENCOUNTER — Other Ambulatory Visit: Payer: Self-pay

## 2015-11-04 ENCOUNTER — Other Ambulatory Visit: Payer: Self-pay | Admitting: Cardiology

## 2015-11-04 DIAGNOSIS — I2129 ST elevation (STEMI) myocardial infarction involving other sites: Secondary | ICD-10-CM

## 2015-11-04 DIAGNOSIS — E785 Hyperlipidemia, unspecified: Secondary | ICD-10-CM | POA: Diagnosis not present

## 2015-11-08 ENCOUNTER — Encounter: Payer: Self-pay | Admitting: Cardiology

## 2015-11-08 ENCOUNTER — Ambulatory Visit (INDEPENDENT_AMBULATORY_CARE_PROVIDER_SITE_OTHER): Payer: No Typology Code available for payment source | Admitting: Cardiology

## 2015-11-08 VITALS — BP 108/68 | HR 54 | Ht 71.0 in | Wt 187.1 lb

## 2015-11-08 DIAGNOSIS — I255 Ischemic cardiomyopathy: Secondary | ICD-10-CM | POA: Diagnosis not present

## 2015-11-08 DIAGNOSIS — Z9861 Coronary angioplasty status: Secondary | ICD-10-CM | POA: Diagnosis not present

## 2015-11-08 DIAGNOSIS — I2510 Atherosclerotic heart disease of native coronary artery without angina pectoris: Secondary | ICD-10-CM

## 2015-11-08 NOTE — Progress Notes (Signed)
Cardiology Office Note   Date:  11/08/2015   ID:  FLEET RIGGAN, DOB 10-09-1956, MRN [id number]  PCP:  Wendie Agreste, MD  Cardiologist:   Minus Breeding, MD   Chief Complaint  Patient presents with  . Follow-up    pt states no chest pain no SOB no light headedness or dizziness, no edema       History of Present Illness: Bradley [name] is a 59 y.o. male who presents for follow-up of an anterior MI. I had seen him earlier in the year for risk stratification with family history. He had a coronary calcium score the with him around the 40th percentile some mild calcium in the LAD. He had a negative exercise. However the anterior MI. He had stenting of proximal LAD. He did have some nonobstructive disease in his RCA as well. His ejection fraction was slightly reduced at 45%. He has done well with therapy. He denies any cardiovascular symptoms. He's not had any chest pressure, neck or arm discomfort. He's had no palpitations, presyncope or syncope. He's had no PND or orthopnea.  Of note he did have an echocardiogram a few days ago and is ejection fraction is now 50-55% with some mild anterior hypokinesis.  Past Medical History  Diagnosis Date  . STEMI (ST elevation myocardial infarction)     ant wall,  Xience alpine to LAD    . LV dysfunction     EF post MI 35-40%  . CAD in native artery     Past Surgical History  Procedure Laterality Date  . Repair ankle ligament    . Hernia repair    . Tonsillectomy    . Inguinal hernia repair Right 10/23/2013    Procedure: HERNIA REPAIR INGUINAL ADULT;  Surgeon: Harl Bowie, MD;  Location: [place];  Service: General;  Laterality: Right;  . Insertion of mesh Right 10/23/2013    Procedure: INSERTION OF MESH;  Surgeon: Harl Bowie, MD;  Location: [place];  Service: General;  Laterality: Right;  . Joint replacement Right     hip resurfacing  . Cardiac catheterization N/A 08/01/2015    Procedure: Left Heart Cath and Coronary  Angiography;  Surgeon: Leonie Man, MD;  Location: [place] [facility];  Service: Cardiovascular;  Laterality: N/A;  . Cardiac catheterization N/A 08/01/2015    Procedure: Coronary Stent Intervention;  Surgeon: Leonie Man, MD;  Location: [place] [facility];  Service: Cardiovascular;  Laterality: N/A;     Current Outpatient Prescriptions  Medication Sig Dispense Refill  . aspirin 81 MG chewable tablet Chew 1 tablet (81 mg total) by mouth daily.    Marland Kitchen atorvastatin (LIPITOR) 80 MG tablet Take 1 tablet (80 mg total) by mouth daily at 6 PM. 30 tablet 5  . clopidogrel (PLAVIX) 75 MG tablet Take 1 tablet (75 mg total) by mouth daily. 90 tablet 3  . lisinopril (PRINIVIL,ZESTRIL) 5 MG tablet Take 1 tablet (5 mg total) by mouth daily. 30 tablet 6  . metoprolol succinate (TOPROL-XL) 25 MG 24 hr tablet Take 0.5 tablets (12.5 mg total) by mouth daily. 30 tablet 5  . nitroGLYCERIN (NITROSTAT) 0.4 MG SL tablet Place 1 tablet (0.4 mg total) under the tongue every 5 (five) minutes as needed for chest pain. 25 tablet 2   No current facility-administered medications for this visit.    Allergies:   Review of patient's allergies indicates no known allergies.    ROS:  Please see the history of present  illness.   Otherwise, review of systems are positive for none.   All other systems are reviewed and negative.    PHYSICAL EXAM: VS:  BP 108/68 mmHg  Pulse 54  Ht 5\' 11"  (1.803 m)  Wt 187 lb 1.6 oz (84.868 kg)  BMI 26.11 kg/m2 , BMI Body mass index is 26.11 kg/(m^2). GENERAL:  Well appearing HEENT:  Pupils equal round and reactive, fundi not visualized, oral mucosa unremarkable NECK:  No jugular venous distention, waveform within normal limits, carotid upstroke brisk and symmetric, no bruits, no thyromegaly LUNGS:  Clear to auscultation bilaterally BACK:  No CVA tenderness CHEST:  Unremarkable HEART:  PMI not displaced or sustained,S1 and S2 within normal limits, no S3, no S4, no clicks, no rubs, no  murmurs ABD:  Flat, positive bowel sounds normal in frequency in pitch, no bruits, no rebound, no guarding, no midline pulsatile mass, no hepatomegaly, no splenomegaly EXT:  2 plus pulses throughout, no edema, no cyanosis no clubbing     EKG:  EKG is not ordered today.    Recent Labs: 08/01/2015: Hemoglobin 15.1; Platelets 160 08/03/2015: BUN 11; Creatinine, Ser 0.99; Potassium 4.3; Sodium 140    Lipid Panel    Component Value Date/Time   CHOL 144 08/02/2015 1525   TRIG 63 08/02/2015 1525   HDL 36* 08/02/2015 1525   CHOLHDL 4.0 08/02/2015 1525   VLDL 13 08/02/2015 1525   LDLCALC 95 08/02/2015 1525         Wt Readings from Last 3 Encounters:  11/08/15 187 lb 1.6 oz (84.868 kg)  08/14/15 176 lb 14.4 oz (80.241 kg)  08/03/15 179 lb 9.6 oz (81.466 kg)      Other studies Reviewed: Additional studies/ records that were reviewed today include: Hospital records. Days couple more days Review of the above records demonstrates:  Please see elsewhere in the note.    ASSESSMENT AND PLAN:  CAD:  The patient has no new sypmtoms.  No further cardiovascular testing is indicated.  We will continue with aggressive risk reduction and meds as listed.  we had a long discussion about continued risk reduction. He can start to moderate exercise regimen.   HTN:  The blood pressure is at target. No change in medications is indicated. We will continue with therapeutic lifestyle changes (TLC)  DYSLIPIDEMIA:  I will have him get a lipid profile fasting at his convenience. The goal will be an LDL less than 70.  Current medicines are reviewed at length with the patient today.  The patient does not have concerns regarding medicines.  The following changes have been made:  no change  Labs/ tests ordered today include:   Lipid profile and liver enzymes.   Disposition:   FU with me in six months.     Signed, Minus Breeding, MD  11/08/2015 10:14 AM    [place]

## 2015-11-08 NOTE — Patient Instructions (Signed)
Your physician wants you to follow-up in: 6 Months. You will receive a reminder letter in the mail two months in advance. If you don't receive a letter, please call our office to schedule the follow-up appointment.  Merry Christmas and Happy New Year!!  

## 2015-11-12 ENCOUNTER — Ambulatory Visit: Payer: No Typology Code available for payment source | Admitting: Cardiology

## 2016-01-26 ENCOUNTER — Other Ambulatory Visit: Payer: Self-pay | Admitting: Cardiology

## 2016-01-27 NOTE — Telephone Encounter (Signed)
Rx(s) sent to pharmacy electronically.  

## 2016-01-28 ENCOUNTER — Other Ambulatory Visit: Payer: Self-pay | Admitting: Cardiology

## 2016-02-10 ENCOUNTER — Encounter (INDEPENDENT_AMBULATORY_CARE_PROVIDER_SITE_OTHER): Payer: BLUE CROSS/BLUE SHIELD | Admitting: Ophthalmology

## 2016-02-10 DIAGNOSIS — H33301 Unspecified retinal break, right eye: Secondary | ICD-10-CM

## 2016-02-10 DIAGNOSIS — H4311 Vitreous hemorrhage, right eye: Secondary | ICD-10-CM

## 2016-02-10 DIAGNOSIS — H43813 Vitreous degeneration, bilateral: Secondary | ICD-10-CM | POA: Diagnosis not present

## 2016-02-10 DIAGNOSIS — H2513 Age-related nuclear cataract, bilateral: Secondary | ICD-10-CM | POA: Diagnosis not present

## 2016-02-20 ENCOUNTER — Ambulatory Visit (INDEPENDENT_AMBULATORY_CARE_PROVIDER_SITE_OTHER): Payer: BLUE CROSS/BLUE SHIELD | Admitting: Ophthalmology

## 2016-02-20 DIAGNOSIS — H33301 Unspecified retinal break, right eye: Secondary | ICD-10-CM

## 2016-05-14 NOTE — Progress Notes (Signed)
Cardiology Office Note   Date:  05/15/2016   ID:  Bradley [name], DOB Oct 26, 1956, MRN [id number]  PCP:  Wendie Agreste, MD  Cardiologist:   Minus Breeding, MD   Chief Complaint  Patient presents with  . Coronary Artery Disease      History of Present Illness: Bradley [name] is a 60 y.o. male who presents for follow-up of an anterior MI. I had seen him last year for risk stratification with family history. He had a coronary calcium score the with him around the 40th percentile some mild calcium in the LAD. He had a negative POET (Plain Old Exercise Treadmill). However he presented in Sept last year with  anterior MI. He had stenting of proximal LAD. He did have some nonobstructive disease in his RCA as well. His ejection fraction was slightly reduced at 45%.  Follow up echo then showed that the EF was 50 - 55%.    He returns for follow up.  Since I last saw him he has done well.  The patient denies any new symptoms such as chest discomfort, neck or arm discomfort. There has been no new shortness of breath, PND or orthopnea. There have been no reported palpitations, presyncope or syncope.  He remains active walking, biking and working in the yard.     Past Medical History  Diagnosis Date  . STEMI (ST elevation myocardial infarction) ([place])     ant wall,  Xience alpine to LAD    . LV dysfunction     EF post MI 35-40%  . CAD in native artery     Past Surgical History  Procedure Laterality Date  . Repair ankle ligament    . Hernia repair    . Tonsillectomy    . Inguinal hernia repair Right 10/23/2013    Procedure: HERNIA REPAIR INGUINAL ADULT;  Surgeon: Harl Bowie, MD;  Location: [place];  Service: General;  Laterality: Right;  . Insertion of mesh Right 10/23/2013    Procedure: INSERTION OF MESH;  Surgeon: Harl Bowie, MD;  Location: [place];  Service: General;  Laterality: Right;  . Joint replacement Right     hip resurfacing  . Cardiac catheterization N/A 08/01/2015      Procedure: Left Heart Cath and Coronary Angiography;  Surgeon: Leonie Man, MD;  Location: [place] [facility];  Service: Cardiovascular;  Laterality: N/A;  . Cardiac catheterization N/A 08/01/2015    Procedure: Coronary Stent Intervention;  Surgeon: Leonie Man, MD;  Location: [place] [facility];  Service: Cardiovascular;  Laterality: N/A;     Current Outpatient Prescriptions  Medication Sig Dispense Refill  . aspirin 81 MG chewable tablet Chew 1 tablet (81 mg total) by mouth daily.    Marland Kitchen atorvastatin (LIPITOR) 80 MG tablet TAKE 1 TABLET BY MOUTH DAILY AT 6 PM 30 tablet 6  . clopidogrel (PLAVIX) 75 MG tablet Take 1 tablet (75 mg total) by mouth daily. 90 tablet 3  . lisinopril (PRINIVIL,ZESTRIL) 2.5 MG tablet TAKE 1 TABLET BY MOUTH DAILY 30 tablet 9  . lisinopril (PRINIVIL,ZESTRIL) 5 MG tablet Take 1 tablet (5 mg total) by mouth daily. 30 tablet 6  . metoprolol succinate (TOPROL-XL) 25 MG 24 hr tablet Take 0.5 tablets (12.5 mg total) by mouth daily. 30 tablet 5  . nitroGLYCERIN (NITROSTAT) 0.4 MG SL tablet Place 1 tablet (0.4 mg total) under the tongue every 5 (five) minutes as needed for chest pain. 25 tablet 2   No current  facility-administered medications for this visit.    Allergies:   Review of patient's allergies indicates no known allergies.    ROS:  Please see the history of present illness.   Otherwise, review of systems are positive for none.   All other systems are reviewed and negative.    PHYSICAL EXAM: VS:  BP 106/67 mmHg  Pulse 54  Ht 5\' 11"  (1.803 m)  Wt 183 lb (83.008 kg)  BMI 25.53 kg/m2 , BMI Body mass index is 25.53 kg/(m^2). GENERAL:  Well appearing HEENT:  Pupils equal round and reactive, fundi not visualized, oral mucosa unremarkable NECK:  No jugular venous distention, waveform within normal limits, carotid upstroke brisk and symmetric, no bruits, no thyromegaly LUNGS:  Clear to auscultation bilaterally BACK:  No CVA tenderness CHEST:   Unremarkable HEART:  PMI not displaced or sustained,S1 and S2 within normal limits, no S3, no S4, no clicks, no rubs, no murmurs ABD:  Flat, positive bowel sounds normal in frequency in pitch, no bruits, no rebound, no guarding, no midline pulsatile mass, no hepatomegaly, no splenomegaly EXT:  2 plus pulses throughout, no edema, no cyanosis no clubbing     EKG:  EKG is not ordered today.    Recent Labs: 08/01/2015: Hemoglobin 15.1; Platelets 160 08/03/2015: BUN 11; Creatinine, Ser 0.99; Potassium 4.3; Sodium 140    Lipid Panel    Component Value Date/Time   CHOL 144 08/02/2015 1525   TRIG 63 08/02/2015 1525   HDL 36* 08/02/2015 1525   CHOLHDL 4.0 08/02/2015 1525   VLDL 13 08/02/2015 1525   LDLCALC 95 08/02/2015 1525        Wt Readings from Last 3 Encounters:  05/15/16 183 lb (83.008 kg)  11/08/15 187 lb 1.6 oz (84.868 kg)  08/14/15 176 lb 14.4 oz (80.241 kg)      Other studies Reviewed: Additional studies/ records that were reviewed today include: Hospital records. Days couple more days Review of the above records demonstrates:  Please see elsewhere in the note.    ASSESSMENT AND PLAN:  CAD:  The patient has no new sypmtoms.  No further cardiovascular testing is indicated.  We will continue with aggressive risk reduction and meds as listed.  we had a long discussion about continued risk reduction.  He can stop his Plavix in Sept.   HTN:  The blood pressure is at target. No change in medications is indicated. We will continue with therapeutic lifestyle changes (TLC)  DYSLIPIDEMIA:  I will have him get a lipid profile today.  The goal will be an LDL less than 70.  Current medicines are reviewed at length with the patient today.  The patient does not have concerns regarding medicines.  The following changes have been made:  no change  Labs/ tests ordered today include:   Lipid profile and liver enzymes.   Disposition:   FU with me in 12 months.      Signed, Minus Breeding, MD  05/15/2016 8:23 AM    [facility]

## 2016-05-15 ENCOUNTER — Encounter: Payer: Self-pay | Admitting: Cardiology

## 2016-05-15 ENCOUNTER — Ambulatory Visit (INDEPENDENT_AMBULATORY_CARE_PROVIDER_SITE_OTHER): Payer: BLUE CROSS/BLUE SHIELD | Admitting: Cardiology

## 2016-05-15 VITALS — BP 106/67 | HR 54 | Ht 71.0 in | Wt 183.0 lb

## 2016-05-15 DIAGNOSIS — Z79899 Other long term (current) drug therapy: Secondary | ICD-10-CM

## 2016-05-15 DIAGNOSIS — E785 Hyperlipidemia, unspecified: Secondary | ICD-10-CM

## 2016-05-15 DIAGNOSIS — I2510 Atherosclerotic heart disease of native coronary artery without angina pectoris: Secondary | ICD-10-CM | POA: Diagnosis not present

## 2016-05-15 DIAGNOSIS — Z9861 Coronary angioplasty status: Secondary | ICD-10-CM | POA: Diagnosis not present

## 2016-05-15 LAB — HEPATIC FUNCTION PANEL
ALK PHOS: 37 U/L — AB (ref 40–115)
ALT: 27 U/L (ref 9–46)
AST: 27 U/L (ref 10–35)
Albumin: 4.3 g/dL (ref 3.6–5.1)
BILIRUBIN INDIRECT: 0.5 mg/dL (ref 0.2–1.2)
Bilirubin, Direct: 0.1 mg/dL (ref <=0.2)
TOTAL PROTEIN: 6.4 g/dL (ref 6.1–8.1)
Total Bilirubin: 0.6 mg/dL (ref 0.2–1.2)

## 2016-05-15 NOTE — Patient Instructions (Signed)
Medication Instructions:  STOP Plavix in September  Labwork: Fasting Lipid Liver  Testing/Procedures: NONE  Follow-Up: Your physician wants you to follow-up in: 1 Year. You will receive a reminder letter in the mail two months in advance. If you don't receive a letter, please call our office to schedule the follow-up appointment.   Any Other Special Instructions Will Be Listed Below (If Applicable).   If you need a refill on your cardiac medications before your next appointment, please call your pharmacy.

## 2016-07-06 ENCOUNTER — Ambulatory Visit (INDEPENDENT_AMBULATORY_CARE_PROVIDER_SITE_OTHER): Payer: BLUE CROSS/BLUE SHIELD | Admitting: Ophthalmology

## 2016-07-06 DIAGNOSIS — H43813 Vitreous degeneration, bilateral: Secondary | ICD-10-CM

## 2016-07-06 DIAGNOSIS — H2513 Age-related nuclear cataract, bilateral: Secondary | ICD-10-CM

## 2016-07-06 DIAGNOSIS — H33301 Unspecified retinal break, right eye: Secondary | ICD-10-CM

## 2016-07-26 ENCOUNTER — Other Ambulatory Visit: Payer: Self-pay | Admitting: Cardiology

## 2016-08-13 ENCOUNTER — Other Ambulatory Visit: Payer: Self-pay | Admitting: Cardiology

## 2016-08-17 ENCOUNTER — Encounter: Payer: Self-pay | Admitting: Cardiology

## 2016-09-02 ENCOUNTER — Other Ambulatory Visit: Payer: Self-pay | Admitting: Cardiology

## 2016-09-30 ENCOUNTER — Other Ambulatory Visit: Payer: Self-pay | Admitting: Cardiology

## 2016-09-30 NOTE — Telephone Encounter (Signed)
Rx has been sent to the pharmacy electronically. ° °

## 2016-10-12 DIAGNOSIS — H524 Presbyopia: Secondary | ICD-10-CM | POA: Diagnosis not present

## 2016-11-10 ENCOUNTER — Other Ambulatory Visit: Payer: Self-pay | Admitting: Cardiology

## 2016-11-11 ENCOUNTER — Encounter: Payer: Self-pay | Admitting: Family Medicine

## 2016-11-11 ENCOUNTER — Other Ambulatory Visit: Payer: Self-pay | Admitting: Family Medicine

## 2016-11-11 DIAGNOSIS — K4090 Unilateral inguinal hernia, without obstruction or gangrene, not specified as recurrent: Secondary | ICD-10-CM

## 2016-11-13 ENCOUNTER — Other Ambulatory Visit: Payer: Self-pay | Admitting: Cardiology

## 2016-11-24 ENCOUNTER — Encounter: Payer: Self-pay | Admitting: Cardiology

## 2016-12-01 ENCOUNTER — Other Ambulatory Visit: Payer: Self-pay | Admitting: Surgery

## 2016-12-01 DIAGNOSIS — K4090 Unilateral inguinal hernia, without obstruction or gangrene, not specified as recurrent: Secondary | ICD-10-CM | POA: Diagnosis not present

## 2016-12-07 ENCOUNTER — Telehealth: Payer: Self-pay | Admitting: Cardiology

## 2016-12-07 NOTE — Telephone Encounter (Signed)
The patient should already be off of Plavix and should be on ASA only.  This needs to continue.  Based on ACC/AHA guidelines, the patient would be at acceptable risk for the planned procedure without further cardiovascular testing.

## 2016-12-07 NOTE — Telephone Encounter (Signed)
Please advise if a surgical clearance form was received.

## 2016-12-07 NOTE — Telephone Encounter (Signed)
Surgery clearance faxed to [facility] via Epic

## 2016-12-07 NOTE — Telephone Encounter (Signed)
Chermira,From Dr Rush Farmer office wants to know if  A fax for surgical clearance was received on 1-0-18?She need this back asap.If you have not received this fax,please let her know.

## 2016-12-07 NOTE — Telephone Encounter (Signed)
Requesting surgical clearance:   1. Type of surgery: Inguinal Hernia Repair with Mesh  2. Surgeon: Dr Mercy Riding  3. Surgical date: Pending  4. Medications that need to be help: Plavix  5. [facility]: (P) [phone] (F) [phone]  Pt saw you last on 06/17, is pt cleared for surgery and how long can plavix be held?

## 2016-12-21 NOTE — Pre-Procedure Instructions (Signed)
    Bradley [name]  12/21/2016      [id number] - [name], [address] [place] [address] Phone: [phone] Fax: [phone]    Your procedure is scheduled on  12/24/2016 @ 7:30 A.M  Report to Admissions at 5:30 A.M.  Call this number if you have problems the morning of surgery:             [phone]   Remember:  Do not eat food or drink liquids after midnight.  Take these medicines the morning of surgery with A SIP OF WATER metoprolol succinate (toprol- Xl) 2.5 mg.     Do not wear lotions, powders, or cologne, or deoderant.    Men may shave face and neck.  Do not bring valuables to the hospital.  [facility] is not responsible for any belongings or valuables.  Contacts, dentures or bridgework may not be worn into surgery.  Leave your suitcase in the car.  After surgery it may be brought to your room.  For patients admitted to the hospital, discharge time will be determined by your treatment team.  Patients discharged the day of surgery will not be allowed to drive home.    Special instructions: See attached  Please read over the following fact sheets that you were given. Pain Booklet, Coughing and Deep Breathing and Surgical Site Infection Prevention

## 2016-12-22 ENCOUNTER — Encounter (HOSPITAL_COMMUNITY)
Admission: RE | Admit: 2016-12-22 | Discharge: 2016-12-22 | Disposition: A | Discharge status: Home / Self Care | Payer: BLUE CROSS/BLUE SHIELD | Source: Ambulatory Visit | Attending: Surgery | Admitting: Surgery

## 2016-12-22 ENCOUNTER — Encounter (HOSPITAL_COMMUNITY): Payer: Self-pay

## 2016-12-22 DIAGNOSIS — Z01810 Encounter for preprocedural cardiovascular examination: Secondary | ICD-10-CM | POA: Insufficient documentation

## 2016-12-22 DIAGNOSIS — Z01812 Encounter for preprocedural laboratory examination: Secondary | ICD-10-CM | POA: Insufficient documentation

## 2016-12-22 DIAGNOSIS — K4090 Unilateral inguinal hernia, without obstruction or gangrene, not specified as recurrent: Secondary | ICD-10-CM | POA: Diagnosis not present

## 2016-12-22 LAB — BASIC METABOLIC PANEL
Anion gap: 5 (ref 5–15)
BUN: 12 mg/dL (ref 6–20)
CHLORIDE: 107 mmol/L (ref 101–111)
CO2: 26 mmol/L (ref 22–32)
CREATININE: 0.97 mg/dL (ref 0.61–1.24)
Calcium: 9.1 mg/dL (ref 8.9–10.3)
GFR calc non Af Amer: >60 mL/min (ref >60)
Glucose, Bld: 94 mg/dL (ref 65–99)
POTASSIUM: 4.4 mmol/L (ref 3.5–5.1)
SODIUM: 138 mmol/L (ref 135–145)

## 2016-12-22 LAB — CBC
HEMATOCRIT: 44.4 % (ref 39.0–52.0)
HEMOGLOBIN: 14.8 g/dL (ref 13.0–17.0)
MCH: 31.9 pg (ref 26.0–34.0)
MCHC: 33.3 g/dL (ref 30.0–36.0)
MCV: 95.7 fL (ref 78.0–100.0)
Platelets: 131 10*3/uL — ABNORMAL LOW (ref 150–400)
RBC: 4.64 MIL/uL (ref 4.22–5.81)
RDW: 12.7 % (ref 11.5–15.5)
WBC: 5.6 10*3/uL (ref 4.0–10.5)

## 2016-12-22 MED ORDER — CHLORHEXIDINE GLUCONATE CLOTH 2 % EX PADS: 6.0000 | MEDICATED_PAD | Freq: Once | CUTANEOUS | Status: DC

## 2016-12-22 NOTE — Progress Notes (Signed)
Cardiologist: Dr. Demetria Pore  Stress test in [place] from 2016 Echo in EPIC 2016 ECG in EPIC 2016 Cardiac Cath in [facility] 2016  Denies Chest x-ray past 12 months

## 2016-12-23 NOTE — Progress Notes (Signed)
Anesthesia Chart Review:  Pt is a 61 year old male scheduled for L inguinal hernia repair with mesh on 12/24/2016 with Coralie Keens, MD  - Cardiologist is Minus Breeding, MD, last office visit 05/14/16 with yearly f/u recommended, who cleared pt for surgery.   PMH includes:  CAD (stent to LAD 08/01/15). Never smoker. BMI 25.5. S/p R inguinal hernia repair 10/23/13.   Medications include: ASA, Lipitor, lisinopril, metoprolol.  Preoperative labs reviewed.    EKG 12/22/16: Sinus bradycardia (50 bpm).   Echo 11/04/15:  - Left ventricle: The cavity size was normal. Systolic function was normal. The estimated ejection fraction was in the range of 50% to 55%. Wall motion was normal; there were no regional wall motion abnormalities. - Mitral valve: Structurally normal valve. - Right ventricle: The cavity size was normal. Wall thickness was normal. Systolic function was normal. - Right atrium: The atrium was normal in size. - Pericardium, extracardiac: There was no pericardial effusion. - Impressions: This was a limited study for the evaluation of the LVEF. There is mild hypokinesis of the apical segments, however overall LVEF is low normal estimated at 50-55%, previously 35-40%.  Cardiac cath 08/01/15: 1. Mid LAD lesion, 99% stenosed followed by Dist LAD lesion, 60% stenosed. A Xience Alpine 3.5 mm x 33 mm drug-eluting stent was placed covering both lesions . There is a 0% residual stenosis post intervention. 2. There is moderate left ventricular systolic dysfunction with expected mid to apical Anterior-Anterolateral Hypokinesis 3. Dist RCA lesion, 30% stenosed.   If no changes, I anticipate pt can proceed with surgery as scheduled.   Willeen Cass, FNP-BC [facility] Short Stay Surgical Center/Anesthesiology Phone: [phone] 12/23/2016 12:26 PM

## 2016-12-23 NOTE — H&P (Signed)
Bradley [name] 12/01/2016 10:50 AM Location: [name] Patient #: [id number] DOB: 07-Dec-1955 Married / Language: English / Race: White Male   History of Present Illness (Bradley [name]; 12/01/2016 11:07 AM) Patient words: New-Ing hernia.  The patient is a 61 year old male who presents with an inguinal hernia. This is a pleasant gentleman who I have repaired a right inguinal hernia with mesh back in 2014. He now has a left inguinal hernia. From a medical standpoint he has had a significant change in his medical history having had a heart attack last year. He has stents placed and is now off Plavix and only on aspirin and doing well. He saw his cardiologist in December. He started noticing a bulge about a year ago in the groin and has minimal discomfort from this and no obstructive symptoms.   Past Surgical History Bradley [name]; 12/01/2016 10:50 AM) Hip [name]  Right. Open Inguinal Hernia [name]  Bilateral. Oral [name]  Tonsillectomy  Vasectomy   Diagnostic Studies History Bradley [name]; 12/01/2016 10:50 AM) Colonoscopy  never  Allergies Bradley [name]; 12/01/2016 10:51 AM) No Known Drug Allergies 12/01/2016  Medication History (Bradley [name]; 12/01/2016 10:51 AM) Atorvastatin Calcium (80MG  Tablet, Oral) Active. Lisinopril (2.5MG  Tablet, Oral) Active. Metoprolol Succinate ER (25MG  Tablet ER 24HR, Oral) Active. Medications Reconciled  Social History Bradley [name]; 12/01/2016 10:50 AM) Alcohol use  Occasional alcohol use. Caffeine use  Tea. Illicit drug use  Remotely quit drug use. Tobacco use  Never smoker.  Family History Bradley [name]; 12/01/2016 10:50 AM) Breast Cancer  Mother. Cerebrovascular Accident  Mother. Heart Disease  Father, Mother, Sister. Heart disease in male family member before age 62  Heart disease in male family member before age 30  Hypertension  Brother.  Other Problems Bradley [name]; 12/01/2016 10:50 AM) Myocardial infarction     Review of Systems (Bradley Jones [name]; 12/01/2016 10:50 AM) General Not Present- Appetite Loss, Chills, Fatigue, Fever, Night Sweats, Weight Gain and Weight Loss. Skin Not Present- Change in Wart/Mole, Dryness, Hives, Jaundice, New Lesions, Non-Healing Wounds, Rash and Ulcer. HEENT Present- Wears glasses/contact lenses. Not Present- Earache, Hearing Loss, Hoarseness, Nose Bleed, Oral Ulcers, Ringing in the Ears, Seasonal Allergies, Sinus Pain, Sore Throat, Visual Disturbances and Yellow Eyes. Respiratory Present- Snoring. Not Present- Bloody sputum, Chronic Cough, Difficulty Breathing and Wheezing. Breast Not Present- Breast Mass, Breast Pain, Nipple Discharge and Skin Changes. Cardiovascular Not Present- Chest Pain, Difficulty Breathing Lying Down, Leg Cramps, Palpitations, Rapid Heart Rate, Shortness of Breath and Swelling of Extremities. Gastrointestinal Not Present- Abdominal Pain, Bloating, Bloody Stool, Change in Bowel Habits, Chronic diarrhea, Constipation, Difficulty Swallowing, Excessive gas, Gets full quickly at meals, Hemorrhoids, Indigestion, Nausea, Rectal Pain and Vomiting. Male Genitourinary Not Present- Blood in Urine, Change in Urinary Stream, Frequency, Impotence, Nocturia, Painful Urination, Urgency and Urine Leakage. Musculoskeletal Not Present- Back Pain, Joint Pain, Joint Stiffness, Muscle Pain, Muscle Weakness and Swelling of Extremities. Neurological Not Present- Decreased Memory, Fainting, Headaches, Numbness, Seizures, Tingling, Tremor, Trouble walking and Weakness. Psychiatric Not Present- Anxiety, Bipolar, Change in Sleep Pattern, Depression, Fearful and Frequent crying. Endocrine Not Present- Cold Intolerance, Excessive Hunger, Hair Changes, Heat Intolerance, Hot flashes and New Diabetes. Hematology Present- Easy Bruising. Not Present- Blood Thinners, Excessive bleeding, Gland problems, HIV and Persistent  Infections.  Vitals (Bradley Jones [name]; 12/01/2016 10:51 AM) 12/01/2016 10:50 AM Weight: 183.2 lb Height: 71in Body Surface Area: 2.03 m Body Mass Index: 25.55 kg/m  Temp.: 98.66F(Oral)  Pulse: 56 (Regular)  BP: 110/80 (Sitting, Left Arm, Standard)       Physical Exam (Bradley [name]; 12/01/2016 11:07 AM) The physical exam findings are as follows: Note:On examination, he is well appearance. He does have an easily reducible small left inguinal hernia Generally well in appearance Lungs clear CV RRR Ext without edema Skin without reddness    Assessment & Plan (Bradley [name]; 12/01/2016 11:08 AM) LEFT INGUINAL HERNIA (K40.90) Impression: He understands the diagnosis well having had the right hernia repaired in the past with mesh. He wishes to again proceed with an open right inguinal hernia repair with mesh. I would be able to do an ilioinguinal nerve block and a lighter general anesthetic for this. I would also be followed with him staying on his aspirin given the cardiac event. I discussed the risk of [name] with him which includes but is not limited to bleeding, infection, further cardiopulmonary issues, recurrent hernia, nerve entrapment, etc. He understands and wishes to proceed with [name]

## 2016-12-24 ENCOUNTER — Ambulatory Visit (HOSPITAL_COMMUNITY): Payer: BLUE CROSS/BLUE SHIELD | Admitting: Anesthesiology

## 2016-12-24 ENCOUNTER — Ambulatory Visit (HOSPITAL_COMMUNITY): Payer: BLUE CROSS/BLUE SHIELD | Admitting: Emergency Medicine

## 2016-12-24 ENCOUNTER — Encounter (HOSPITAL_COMMUNITY)
Admission: RE | Disposition: A | Discharge status: Home / Self Care | Payer: Self-pay | Source: Ambulatory Visit | Attending: Surgery

## 2016-12-24 ENCOUNTER — Encounter (HOSPITAL_COMMUNITY): Payer: Self-pay | Admitting: *Deleted

## 2016-12-24 ENCOUNTER — Ambulatory Visit (HOSPITAL_COMMUNITY)
Admission: RE | Admit: 2016-12-24 | Discharge: 2016-12-24 | Disposition: A | Discharge status: Home / Self Care | Payer: BLUE CROSS/BLUE SHIELD | Source: Ambulatory Visit | Attending: Surgery | Admitting: Surgery

## 2016-12-24 DIAGNOSIS — K4090 Unilateral inguinal hernia, without obstruction or gangrene, not specified as recurrent: Secondary | ICD-10-CM | POA: Diagnosis not present

## 2016-12-24 DIAGNOSIS — I255 Ischemic cardiomyopathy: Secondary | ICD-10-CM | POA: Diagnosis not present

## 2016-12-24 DIAGNOSIS — E785 Hyperlipidemia, unspecified: Secondary | ICD-10-CM | POA: Diagnosis not present

## 2016-12-24 DIAGNOSIS — I2510 Atherosclerotic heart disease of native coronary artery without angina pectoris: Secondary | ICD-10-CM | POA: Diagnosis not present

## 2016-12-24 HISTORY — PX: INSERTION OF MESH: SHX5868

## 2016-12-24 HISTORY — PX: INGUINAL HERNIA REPAIR: SHX194

## 2016-12-24 SURGERY — REPAIR, HERNIA, INGUINAL, ADULT
Anesthesia: General | Site: Groin | Laterality: Left

## 2016-12-24 MED ORDER — PROPOFOL 10 MG/ML IV BOLUS
INTRAVENOUS | Status: DC | PRN
  Administered 2016-12-24: 180 mg via INTRAVENOUS

## 2016-12-24 MED ORDER — BUPIVACAINE HCL (PF) 0.5 % IJ SOLN
INTRAMUSCULAR | Status: AC
  Filled 2016-12-24: qty 30

## 2016-12-24 MED ORDER — OXYCODONE-ACETAMINOPHEN 5-325 MG PO TABS: 1.0000 | ORAL_TABLET | ORAL | 0 refills | Status: DC | PRN

## 2016-12-24 MED ORDER — 0.9 % SODIUM CHLORIDE (POUR BTL) OPTIME
TOPICAL | Status: DC | PRN
  Administered 2016-12-24: 1000 mL

## 2016-12-24 MED ORDER — PROPOFOL 10 MG/ML IV BOLUS
INTRAVENOUS | Status: AC
  Filled 2016-12-24: qty 20

## 2016-12-24 MED ORDER — HYDROMORPHONE HCL 1 MG/ML IJ SOLN: 0.2500 mg | INTRAMUSCULAR | Status: DC | PRN

## 2016-12-24 MED ORDER — FENTANYL CITRATE (PF) 100 MCG/2ML IJ SOLN
INTRAMUSCULAR | Status: DC | PRN
  Administered 2016-12-24: 50 ug via INTRAVENOUS

## 2016-12-24 MED ORDER — ONDANSETRON HCL 4 MG/2ML IJ SOLN: 4.0000 mg | Freq: Once | INTRAMUSCULAR | Status: DC | PRN

## 2016-12-24 MED ORDER — FENTANYL CITRATE (PF) 100 MCG/2ML IJ SOLN
INTRAMUSCULAR | Status: AC
  Filled 2016-12-24: qty 4

## 2016-12-24 MED ORDER — OXYCODONE HCL 5 MG/5ML PO SOLN: 5.0000 mg | Freq: Once | ORAL | Status: DC | PRN

## 2016-12-24 MED ORDER — MIDAZOLAM HCL 2 MG/2ML IJ SOLN
INTRAMUSCULAR | Status: AC
  Filled 2016-12-24: qty 2

## 2016-12-24 MED ORDER — MIDAZOLAM HCL 5 MG/5ML IJ SOLN
INTRAMUSCULAR | Status: DC | PRN
  Administered 2016-12-24: 2 mg via INTRAVENOUS

## 2016-12-24 MED ORDER — OXYCODONE HCL 5 MG PO TABS: 5.0000 mg | ORAL_TABLET | Freq: Once | ORAL | Status: DC | PRN

## 2016-12-24 MED ORDER — CEFAZOLIN SODIUM-DEXTROSE 2-4 GM/100ML-% IV SOLN
2.0000 g | INTRAVENOUS | Status: AC
  Administered 2016-12-24: 2 g via INTRAVENOUS
  Filled 2016-12-24: qty 100

## 2016-12-24 MED ORDER — OXYCODONE HCL 5 MG PO TABS: 5.0000 mg | ORAL_TABLET | ORAL | Status: DC | PRN

## 2016-12-24 MED ORDER — DEXAMETHASONE SODIUM PHOSPHATE 10 MG/ML IJ SOLN
INTRAMUSCULAR | Status: DC | PRN
  Administered 2016-12-24: 10 mg via INTRAVENOUS

## 2016-12-24 MED ORDER — LACTATED RINGERS IV SOLN
INTRAVENOUS | Status: DC | PRN
  Administered 2016-12-24: 07:00:00 via INTRAVENOUS

## 2016-12-24 MED ORDER — ONDANSETRON HCL 4 MG/2ML IJ SOLN
INTRAMUSCULAR | Status: DC | PRN
  Administered 2016-12-24: 4 mg via INTRAVENOUS

## 2016-12-24 MED ORDER — LIDOCAINE HCL (CARDIAC) 20 MG/ML IV SOLN
INTRAVENOUS | Status: DC | PRN
  Administered 2016-12-24: 50 mg via INTRAVENOUS

## 2016-12-24 MED ORDER — BUPIVACAINE HCL (PF) 0.5 % IJ SOLN
INTRAMUSCULAR | Status: DC | PRN
  Administered 2016-12-24: 20 mL

## 2016-12-24 SURGICAL SUPPLY — 44 items
ADH SKN CLS APL DERMABOND .7 (GAUZE/BANDAGES/DRESSINGS) ×1
BLADE SURG 10 STRL SS (BLADE) ×2 IMPLANT
BLADE SURG 15 STRL LF DISP TIS (BLADE) ×1 IMPLANT
BLADE SURG 15 STRL SS (BLADE) ×2
BLADE SURG ROTATE 9660 (MISCELLANEOUS) ×1 IMPLANT
CHLORAPREP W/TINT 26ML (MISCELLANEOUS) ×2 IMPLANT
COVER SURGICAL LIGHT HANDLE (MISCELLANEOUS) ×2 IMPLANT
DECANTER SPIKE VIAL GLASS SM (MISCELLANEOUS) ×1 IMPLANT
DERMABOND ADVANCED (GAUZE/BANDAGES/DRESSINGS) ×1
DERMABOND ADVANCED .7 DNX12 (GAUZE/BANDAGES/DRESSINGS) ×1 IMPLANT
DRAIN PENROSE 1/2X12 LTX STRL (WOUND CARE) ×1 IMPLANT
DRAPE LAPAROTOMY TRNSV 102X78 (DRAPE) ×2 IMPLANT
DRAPE UTILITY XL STRL (DRAPES) ×2 IMPLANT
ELECT CAUTERY BLADE 6.4 (BLADE) ×2 IMPLANT
ELECT REM PT RETURN 9FT ADLT (ELECTROSURGICAL) ×2
ELECTRODE REM PT RTRN 9FT ADLT (ELECTROSURGICAL) ×1 IMPLANT
GLOVE BIOGEL PI IND STRL 7.0 (GLOVE) IMPLANT
GLOVE BIOGEL PI INDICATOR 7.0 (GLOVE) ×2
GLOVE ECLIPSE 6.5 STRL STRAW (GLOVE) ×1 IMPLANT
GLOVE SURG SIGNA 7.5 PF LTX (GLOVE) ×2 IMPLANT
GLOVE SURG SS PI 7.0 STRL IVOR (GLOVE) ×1 IMPLANT
GOWN STRL REUS W/ TWL LRG LVL3 (GOWN DISPOSABLE) ×1 IMPLANT
GOWN STRL REUS W/ TWL XL LVL3 (GOWN DISPOSABLE) ×1 IMPLANT
GOWN STRL REUS W/TWL LRG LVL3 (GOWN DISPOSABLE) ×4
GOWN STRL REUS W/TWL XL LVL3 (GOWN DISPOSABLE) ×2
KIT BASIN OR (CUSTOM PROCEDURE TRAY) ×2 IMPLANT
KIT ROOM TURNOVER OR (KITS) ×2 IMPLANT
MESH PARIETEX PROGRIP LEFT (Mesh General) ×1 IMPLANT
NDL HYPO 25GX1X1/2 BEV (NEEDLE) ×1 IMPLANT
NEEDLE HYPO 25GX1X1/2 BEV (NEEDLE) ×2 IMPLANT
NS IRRIG 1000ML POUR BTL (IV SOLUTION) ×2 IMPLANT
PACK SURGICAL SETUP 50X90 (CUSTOM PROCEDURE TRAY) ×2 IMPLANT
PAD ARMBOARD 7.5X6 YLW CONV (MISCELLANEOUS) ×2 IMPLANT
PENCIL BUTTON HOLSTER BLD 10FT (ELECTRODE) ×2 IMPLANT
SPONGE LAP 18X18 X RAY DECT (DISPOSABLE) ×2 IMPLANT
SUT MON AB 4-0 PC3 18 (SUTURE) ×2 IMPLANT
SUT SILK 2 0 SH (SUTURE) ×1 IMPLANT
SUT VIC AB 2-0 CT1 27 (SUTURE) ×2
SUT VIC AB 2-0 CT1 TAPERPNT 27 (SUTURE) ×1 IMPLANT
SUT VIC AB 3-0 CT1 27 (SUTURE) ×2
SUT VIC AB 3-0 CT1 TAPERPNT 27 (SUTURE) ×1 IMPLANT
SYR CONTROL 10ML LL (SYRINGE) ×2 IMPLANT
TOWEL OR 17X24 6PK STRL BLUE (TOWEL DISPOSABLE) ×2 IMPLANT
TOWEL OR 17X26 10 PK STRL BLUE (TOWEL DISPOSABLE) ×1 IMPLANT

## 2016-12-24 NOTE — Op Note (Signed)
LEFT INGUINAL HERNIA REPAIR, INSERTION OF MESH  Procedure Note  HANEY SIMONET 12/24/2016   Pre-op Diagnosis: LEFT INGUINAL HERNIA     Post-op Diagnosis: same  Procedure(s): LEFT INGUINAL HERNIA REPAIR INSERTION OF MESH  Surgeon(s): Coralie Keens, MD  Anesthesia: General  Staff:  Circulator: Milas Kocher, RN Scrub Person: Susann Givens, RN; [place] Circulator Assistant: Susann Givens, RN  Estimated Blood Loss: Minimal               Indications: This is a 61 year old gentleman who has had a previous left inguinal hernia repairs the past. He now has a recurrence since decision has been made to proceed to the operating room for repair with mesh.   Findings: The patient was found to have an indirect left inguinal hernia. His previous repair was primary with no mesh. This hernia was repaired with a large piece of Prolene pro-grip mesh from [place]  Procedure: The patient was brought to the operating room and identified as the correct patient. He was placed supine on the operating table and general anesthesia was induced. His left lower quadrant was and prepped and draped in the usual sterile fashion. I attempted a left ilioinguinal nerve block with Marcaine. I then anesthetized the skin and all incision with Marcaine. I then made in a longitudinal incision in the left inguinal area through the previous scar with a scalpel. I took this down to Scarpa's fascia with electrocautery. The patient had minimal scarring from his previous repair. I then identified the external leak fashion open for the internal and external rings. Identified the testicular cord structures and controlled with a Penrose drain. The patient had a large chronic indirect hernia sac. I reduced the sac and its contents in its entirety and then closed the internal ring with a figure-of-eight 2-0 silk suture. I then brought a large piece of Prolene pro-grip mesh onto the field. I placed against  pubic tubercle and then brought around the cord structures laying on the inguinal floor. I then sewed in place with one single 2-0 Vicryl suture to the pubic tubercle. [place] inguinal floor internal ring appeared to be achieved. I then closed the external bleak fascia over the top of the mesh with a 2-0 Vicryl suture. I anesthetized the skin and fascia further with Marcaine. I then closed Scarpa's fascia with interrupted 3-0 Vicryl sutures and closed the skin with a running 4-0 Monocryl. Skin glue was then applied. The patient tolerated the procedure well. All the counts were correct at the end of the procedure. The patient was then extubated in the operative and taken in a stable condition to the recovery room.            Uzma Hellmer A   Date: 12/24/2016  Time: 8:17 AM

## 2016-12-24 NOTE — Transfer of Care (Signed)
Immediate Anesthesia Transfer of Care Note  Patient: Bradley [name]  Procedure(s) Performed: Procedure(s): LEFT INGUINAL HERNIA REPAIR (Left) INSERTION OF MESH (Left)  Patient Location: PACU  Anesthesia Type:General  Level of Consciousness: awake, alert , oriented and patient cooperative  Airway & Oxygen Therapy: Patient Spontanous Breathing  Post-op Assessment: Report given to RN and Post -op Vital signs reviewed and stable  Post vital signs: Reviewed and stable  Last Vitals:  Vitals:   12/24/16 0617 12/24/16 0824  BP: 125/82   Pulse: (!) 52   Resp: 18   Temp: 36.4 C 36.3 C    Last Pain:  Vitals:   12/24/16 0617  TempSrc: Oral         Complications: No apparent anesthesia complications

## 2016-12-24 NOTE — Interval H&P Note (Signed)
History and Physical Interval Note:no change in H and P  12/24/2016 7:01 AM  Bradley [name]  has presented today for surgery, with the diagnosis of LEFT INGUINAL HERNIA  The various methods of treatment have been discussed with the patient and family. After consideration of risks, benefits and other options for treatment, the patient has consented to  Procedure(s): LEFT INGUINAL HERNIA REPAIR WITH MESH (Left) as a surgical intervention .  The patient's history has been reviewed, patient examined, no change in status, stable for surgery.  I have reviewed the patient's chart and labs.  Questions were answered to the patient's satisfaction.     [name]

## 2016-12-24 NOTE — Discharge Instructions (Signed)
[facility] _______[facility]  UMBILICAL OR INGUINAL HERNIA REPAIR: POST OP INSTRUCTIONS  Always review your discharge instruction sheet given to you by the facility where your surgery was performed. IF YOU HAVE DISABILITY OR FAMILY LEAVE FORMS, YOU MUST BRING THEM TO THE OFFICE FOR PROCESSING.   DO NOT GIVE THEM TO YOUR DOCTOR.  1. A  prescription for pain medication may be given to you upon discharge.  Take your pain medication as prescribed, if needed.  If narcotic pain medicine is not needed, then you may take acetaminophen (Tylenol) or ibuprofen (Advil) as needed. 2. Take your usually prescribed medications unless otherwise directed. If you need a refill on your pain medication, please contact your pharmacy.  They will contact our office to request authorization. Prescriptions will not be filled after 5 pm or on week-ends. 3. You should follow a light diet the first 24 hours after arrival home, such as soup and crackers, etc.  Be sure to include lots of fluids daily.  Resume your normal diet the day after surgery. 4.Most patients will experience some swelling and bruising around the umbilicus or in the groin and scrotum.  Ice packs and reclining will help.  Swelling and bruising can take several days to resolve.  6. It is common to experience some constipation if taking pain medication after surgery.  Increasing fluid intake and taking a stool softener (such as Colace) will usually help or prevent this problem from occurring.  A mild laxative (Milk of Magnesia or Miralax) should be taken according to package directions if there are no bowel movements after 48 hours. 7. Unless discharge instructions indicate otherwise, you may remove your bandages 24-48 hours after surgery, and you may shower at that time.  You may have steri-strips (small skin tapes) in place directly over the incision.  These strips should be left on the skin for 7-10 days.  If your surgeon used skin glue on the  incision, you may shower in 24 hours.  The glue will flake off over the next 2-3 weeks.  Any sutures or staples will be removed at the office during your follow-up visit. 8. ACTIVITIES:  You may resume regular (light) daily activities beginning the next day--such as daily self-care, walking, climbing stairs--gradually increasing activities as tolerated.  You may have sexual intercourse when it is comfortable.  Refrain from any heavy lifting or straining until approved by your doctor.  a.You may drive when you are no longer taking prescription pain medication, you can comfortably wear a seatbelt, and you can safely maneuver your car and apply brakes. b.RETURN TO WORK:   _____________________________________________  9.You should see your doctor in the office for a follow-up appointment approximately 2-3 weeks after your surgery.  Make sure that you call for this appointment within a day or two after you arrive home to insure a convenient appointment time. 10.OTHER INSTRUCTIONS: __OK TO SHOWER TOMORROW ICE PACK AND IBUPROFEN ALSO FOR PAIN NO LIFTING MORE THAN 15 POUNDS FOR 4 WEEKS._______________________    _____________________________________  WHEN TO CALL YOUR DOCTOR: 1. Fever over 101.0 2. Inability to urinate 3. Nausea and/or vomiting 4. Extreme swelling or bruising 5. Continued bleeding from incision. 6. Increased pain, redness, or drainage from the incision  The clinic staff is available to answer your questions during regular business hours.  Please dont hesitate to call and ask to speak to one of the nurses for clinical concerns.  If you have a medical emergency, go to the nearest emergency room  or call 911.  A surgeon from [facility] is always on call at the hospital   [address], [place], [place], [place]  [address] ?  [address], [place], [place]   [id number] [phone] ? [phone] ? FAX [phone] Web site: [email or web address]

## 2016-12-24 NOTE — Anesthesia Preprocedure Evaluation (Addendum)
Anesthesia Evaluation  Patient identified by MRN, date of birth, ID band Patient awake    Reviewed: Allergy & Precautions, NPO status , Patient's Chart, lab work & pertinent test results  Airway Mallampati: II  TM Distance: >3 FB     Dental   Pulmonary    breath sounds clear to auscultation       Cardiovascular  Rhythm:Regular Rate:Normal     Neuro/Psych    GI/Hepatic   Endo/Other    Renal/GU      Musculoskeletal   Abdominal   Peds  Hematology   Anesthesia Other Findings   Reproductive/Obstetrics                            Anesthesia Physical Anesthesia Plan  ASA: III  Anesthesia Plan: General   Post-op Pain Management:    Induction: Intravenous  Airway Management Planned: LMA  Additional Equipment:   Intra-op Plan:   Post-operative Plan:   Informed Consent: I have reviewed the patients History and Physical, chart, labs and discussed the procedure including the risks, benefits and alternatives for the proposed anesthesia with the patient or authorized representative who has indicated his/her understanding and acceptance.     Plan Discussed with: CRNA and Anesthesiologist  Anesthesia Plan Comments:         Anesthesia Quick Evaluation

## 2016-12-24 NOTE — Anesthesia Postprocedure Evaluation (Addendum)
Anesthesia Post Note  Patient: Bradley [name]  Procedure(s) Performed: Procedure(s) (LRB): LEFT INGUINAL HERNIA REPAIR (Left) INSERTION OF MESH (Left)  Patient location during evaluation: PACU Anesthesia Type: General Level of consciousness: awake, awake and alert and oriented Pain management: pain level controlled Vital Signs Assessment: post-procedure vital signs reviewed and stable Respiratory status: spontaneous breathing, nonlabored ventilation and respiratory function stable Cardiovascular status: blood pressure returned to baseline Anesthetic complications: no       Last Vitals:  Vitals:   12/24/16 0915 12/24/16 0930  BP: 120/80   Pulse: (!) 44 (!) 44  Resp: 14 (!) 9  Temp:      Last Pain:  Vitals:   12/24/16 0937  TempSrc:   PainSc: 0-No pain                 Exodus Kutzer COKER

## 2016-12-25 ENCOUNTER — Encounter (HOSPITAL_COMMUNITY): Payer: Self-pay | Admitting: Surgery

## 2017-05-02 ENCOUNTER — Other Ambulatory Visit: Payer: Self-pay | Admitting: Cardiology

## 2017-06-03 ENCOUNTER — Other Ambulatory Visit: Payer: Self-pay | Admitting: Cardiology

## 2017-06-03 NOTE — Telephone Encounter (Signed)
Rx(s) sent to pharmacy electronically.  

## 2017-06-17 ENCOUNTER — Other Ambulatory Visit: Payer: Self-pay | Admitting: Cardiology

## 2017-06-29 ENCOUNTER — Other Ambulatory Visit: Payer: Self-pay | Admitting: Cardiology

## 2017-06-29 NOTE — Telephone Encounter (Signed)
REFILL 

## 2017-07-15 NOTE — Addendum Note (Signed)
Addendum  created 07/15/17 1028 by Roberts Gaudy, MD   Sign clinical note

## 2017-07-16 ENCOUNTER — Encounter: Payer: Self-pay | Admitting: Cardiology

## 2017-08-01 ENCOUNTER — Other Ambulatory Visit: Payer: Self-pay | Admitting: Cardiology

## 2017-08-04 NOTE — Progress Notes (Signed)
Cardiology Office Note   Date:  08/05/2017   ID:  Bradley [name], DOB 1955/12/15, MRN [id number]  PCP:  Wendie Agreste, MD  Cardiologist:   Minus Breeding, MD     Chief Complaint  Patient presents with  . Coronary Artery Disease      History of Present Illness: Bradley [name] is a 61 y.o. male who presents for follow-up of an anterior MI in 2016. He had a coronary calcium score in the past which put him at around the 40th percentile with some mild calcium in the LAD. He had a negative POET (Plain Old Exercise Treadmill). However he presented subsequently with  anterior MI.   He had stenting of proximal LAD. He did have some nonobstructive disease in his RCA as well. His ejection fraction was slightly reduced at 45%.  Follow up echo then showed that the EF was 50 - 55%.  Since I last saw him he has done well.  He rides his bike 2 - 3 hours on the long rides. The patient denies any new symptoms such as chest discomfort, neck or arm discomfort. There has been no new shortness of breath, PND or orthopnea. There have been no reported palpitations, presyncope or syncope.   Past Medical History:  Diagnosis Date  . CAD in native artery   . LV dysfunction    EF post MI 35-40%  . STEMI (ST elevation myocardial infarction) ([place])    ant wall,  Xience alpine to LAD      Past Surgical History:  Procedure Laterality Date  . CARDIAC CATHETERIZATION N/A 08/01/2015   Procedure: Left Heart Cath and Coronary Angiography;  Surgeon: Leonie Man, MD;  Location: [facility];  Service: Cardiovascular;  Laterality: N/A;  . CARDIAC CATHETERIZATION N/A 08/01/2015   Procedure: Coronary Stent Intervention;  Surgeon: Leonie Man, MD;  Location: [facility];  Service: Cardiovascular;  Laterality: N/A;  . HERNIA REPAIR    . INGUINAL HERNIA REPAIR Right 10/23/2013   Procedure: HERNIA REPAIR INGUINAL ADULT;  Surgeon: Harl Bowie, MD;  Location: [facility];  Service: General;  Laterality:  Right;  . INGUINAL HERNIA REPAIR Left 12/24/2016   Procedure: LEFT INGUINAL HERNIA REPAIR;  Surgeon: Coralie Keens, MD;  Location: [facility];  Service: General;  Laterality: Left;  . INSERTION OF MESH Right 10/23/2013   Procedure: INSERTION OF MESH;  Surgeon: Harl Bowie, MD;  Location: [facility];  Service: General;  Laterality: Right;  . INSERTION OF MESH Left 12/24/2016   Procedure: INSERTION OF MESH;  Surgeon: Coralie Keens, MD;  Location: [facility];  Service: General;  Laterality: Left;  . JOINT REPLACEMENT Right    hip resurfacing  . REPAIR ANKLE LIGAMENT    . TONSILLECTOMY       Current Outpatient Prescriptions  Medication Sig Dispense Refill  . aspirin EC 81 MG tablet Take 81 mg by mouth daily.    Marland Kitchen atorvastatin (LIPITOR) 80 MG tablet TAKE 1 TABLET BY MOUTH DAILY AT 6PM 30 tablet 0  . Coenzyme Q10 (CO Q-10) 100 MG CAPS Take 100 mg by mouth 2 (two) times daily.    Marland Kitchen lisinopril (PRINIVIL,ZESTRIL) 2.5 MG tablet TAKE 1 TABLET BY MOUTH DAILY 90 tablet 3  . metoprolol succinate (TOPROL-XL) 25 MG 24 hr tablet TAKE 1/2 TABLET BY MOUTH DAILY (Patient taking differently: TAKE 1 TABLET (25 MG) BY MOUTH BY DAILY) 30 tablet 9  . nitroGLYCERIN (NITROSTAT) 0.4 MG SL tablet Place  1 tablet (0.4 mg total) under the tongue every 5 (five) minutes as needed for chest pain. 25 tablet 2  . oxyCODONE-acetaminophen (ROXICET) 5-325 MG tablet Take 1-2 tablets by mouth every 4 (four) hours as needed for severe pain. 40 tablet 0   No current facility-administered medications for this visit.     Allergies:   No known allergies    ROS:  Please see the history of present illness.   Otherwise, review of systems are positive for none.   All other systems are reviewed and negative.    PHYSICAL EXAM: VS:  BP 122/81   Pulse 76   Ht 5\' 11"  (1.803 m)   Wt 186 lb 9.6 oz (84.6 kg)   SpO2 98%   BMI 26.03 kg/m  , BMI Body mass index is 26.03 kg/m.  GENERAL:  Well appearing NECK:  No jugular venous distention,  waveform within normal limits, carotid upstroke brisk and symmetric, no bruits, no thyromegaly LUNGS:  Clear to auscultation bilaterally CHEST:  Unremarkable HEART:  PMI not displaced or sustained,S1 and S2 within normal limits, no S3, no S4, no clicks, no rubs, no murmurs ABD:  Flat, positive bowel sounds normal in frequency in pitch, no bruits, no rebound, no guarding, no midline pulsatile mass, no hepatomegaly, no splenomegaly EXT:  2 plus pulses throughout, no edema, no cyanosis no clubbing    EKG:  EKG is not ordered today. EKG 12/22/2016 sinus bradycardia, rate 50, axis within normal limits, intervals within normal limits, no acute ST-T wave changes.   Recent Labs: 12/22/2016: BUN 12; Creatinine, Ser 0.97; Hemoglobin 14.8; Platelets 131; Potassium 4.4; Sodium 138    Lipid Panel    Component Value Date/Time   CHOL 144 08/02/2015 1525   TRIG 63 08/02/2015 1525   HDL 36 (L) 08/02/2015 1525   CHOLHDL 4.0 08/02/2015 1525   VLDL 13 08/02/2015 1525   LDLCALC 95 08/02/2015 1525        Wt Readings from Last 3 Encounters:  08/05/17 186 lb 9.6 oz (84.6 kg)  12/22/16 182 lb 14.4 oz (83 kg)  05/15/16 183 lb (83 kg)      Other studies Reviewed: Additional studies/ records that were reviewed today include: None Review of the above records demonstrates:      ASSESSMENT AND PLAN:  CAD:  The patient has no new sypmtoms.  No further cardiovascular testing is indicated.  We will continue with aggressive risk reduction and meds as listed.  HTN:  The blood pressure is at target. No change in medications is indicated. We will continue with therapeutic lifestyle changes (TLC).  DYSLIPIDEMIA:  I will repeat lipid profile.    Current medicines are reviewed at length with the patient today.  The patient does not have concerns regarding medicines.  The following changes have been made:  None  Labs/ tests ordered today include: Lipids    Disposition:   FU with me in 12 months.      Signed, Minus Breeding, MD  08/05/2017 5:32 PM    [place]

## 2017-08-05 ENCOUNTER — Ambulatory Visit (INDEPENDENT_AMBULATORY_CARE_PROVIDER_SITE_OTHER): Payer: BLUE CROSS/BLUE SHIELD | Admitting: Cardiology

## 2017-08-05 ENCOUNTER — Encounter: Payer: Self-pay | Admitting: Cardiology

## 2017-08-05 VITALS — BP 122/81 | HR 76 | Ht 71.0 in | Wt 186.6 lb

## 2017-08-05 DIAGNOSIS — E785 Hyperlipidemia, unspecified: Secondary | ICD-10-CM

## 2017-08-05 DIAGNOSIS — I2510 Atherosclerotic heart disease of native coronary artery without angina pectoris: Secondary | ICD-10-CM | POA: Diagnosis not present

## 2017-08-05 DIAGNOSIS — Z79899 Other long term (current) drug therapy: Secondary | ICD-10-CM | POA: Diagnosis not present

## 2017-08-05 MED ORDER — NITROGLYCERIN 0.4 MG SL SUBL: 0.4000 mg | SUBLINGUAL_TABLET | SUBLINGUAL | 2 refills | Status: DC | PRN

## 2017-08-05 NOTE — Patient Instructions (Addendum)
Medication Instructions:  Continue current medications  Labwork: Fasting lipid liver  Testing/Procedures: None Ordered  Follow-Up: Your physician wants you to follow-up in: 1 Year. You will receive a reminder letter in the mail two months in advance. If you don't receive a letter, please call our office to schedule the follow-up appointment.   Any Other Special Instructions Will Be Listed Below (If Applicable).   If you need a refill on your cardiac medications before your next appointment, please call your pharmacy.

## 2017-08-09 DIAGNOSIS — E785 Hyperlipidemia, unspecified: Secondary | ICD-10-CM | POA: Diagnosis not present

## 2017-08-09 DIAGNOSIS — Z79899 Other long term (current) drug therapy: Secondary | ICD-10-CM | POA: Diagnosis not present

## 2017-08-09 LAB — HEPATIC FUNCTION PANEL
ALT: 30 IU/L (ref 0–44)
AST: 29 IU/L (ref 0–40)
Albumin: 4.3 g/dL (ref 3.6–4.8)
Alkaline Phosphatase: 46 IU/L (ref 39–117)
BILIRUBIN, DIRECT: 0.21 mg/dL (ref 0.00–0.40)
Bilirubin Total: 0.7 mg/dL (ref 0.0–1.2)
Total Protein: 6.4 g/dL (ref 6.0–8.5)

## 2017-08-09 LAB — LIPID PANEL
CHOLESTEROL TOTAL: 101 mg/dL (ref 100–199)
Chol/HDL Ratio: 2.7 ratio (ref 0.0–5.0)
HDL: 38 mg/dL — ABNORMAL LOW (ref >39)
LDL CALC: 52 mg/dL (ref 0–99)
TRIGLYCERIDES: 56 mg/dL (ref 0–149)
VLDL Cholesterol Cal: 11 mg/dL (ref 5–40)

## 2017-08-24 ENCOUNTER — Other Ambulatory Visit: Payer: Self-pay | Admitting: Cardiology

## 2017-09-01 ENCOUNTER — Other Ambulatory Visit: Payer: Self-pay | Admitting: Cardiology

## 2017-09-13 ENCOUNTER — Other Ambulatory Visit: Payer: Self-pay | Admitting: Cardiology

## 2017-10-31 ENCOUNTER — Other Ambulatory Visit: Payer: Self-pay | Admitting: Cardiology

## 2018-01-29 ENCOUNTER — Other Ambulatory Visit: Payer: Self-pay | Admitting: Cardiology

## 2018-04-29 ENCOUNTER — Other Ambulatory Visit: Payer: Self-pay | Admitting: Cardiology

## 2018-07-28 ENCOUNTER — Other Ambulatory Visit: Payer: Self-pay | Admitting: Cardiology

## 2018-08-26 ENCOUNTER — Other Ambulatory Visit: Payer: Self-pay | Admitting: Cardiology

## 2018-09-17 ENCOUNTER — Other Ambulatory Visit: Payer: Self-pay | Admitting: Cardiology

## 2018-10-15 ENCOUNTER — Other Ambulatory Visit: Payer: Self-pay | Admitting: Cardiology

## 2018-10-25 ENCOUNTER — Other Ambulatory Visit: Payer: Self-pay | Admitting: Cardiology

## 2018-10-25 NOTE — Progress Notes (Signed)
Cardiology Office Note   Date:  10/26/2018   ID:  Bradley [name], DOB May 09, 1956, MRN [id number]  PCP:  Wendie Agreste, MD  Cardiologist:   Minus Breeding, MD     Chief Complaint  Patient presents with  . Coronary Artery Disease      History of Present Illness: Bradley [name] is a 62 y.o. male who presents for follow-up of an anterior MI in 2016. He had a coronary calcium score in the past which put him at around the 40th percentile with some mild calcium in the LAD. He had a negative POET (Plain Old Exercise Treadmill). However he presented subsequently with  anterior MI.   He had stenting of proximal LAD. He did have some nonobstructive disease in his RCA as well. His ejection fraction was slightly reduced at 45%.  Follow up echo then showed that the EF was 50 - 55%.  Since I last saw him he has done well.  He rides his bike.  The patient denies any new symptoms such as chest discomfort, neck or arm discomfort. There has been no new shortness of breath, PND or orthopnea. There have been no reported palpitations, presyncope or syncope.    Past Medical History:  Diagnosis Date  . CAD in native artery   . LV dysfunction    EF post MI 35-40%  . STEMI (ST elevation myocardial infarction) ([place])    ant wall,  Xience alpine to LAD      Past Surgical History:  Procedure Laterality Date  . CARDIAC CATHETERIZATION N/A 08/01/2015   Procedure: Left Heart Cath and Coronary Angiography;  Surgeon: Leonie Man, MD;  Location: [place] [facility];  Service: Cardiovascular;  Laterality: N/A;  . CARDIAC CATHETERIZATION N/A 08/01/2015   Procedure: Coronary Stent Intervention;  Surgeon: Leonie Man, MD;  Location: [place] [facility];  Service: Cardiovascular;  Laterality: N/A;  . HERNIA REPAIR    . INGUINAL HERNIA REPAIR Right 10/23/2013   Procedure: HERNIA REPAIR INGUINAL ADULT;  Surgeon: Harl Bowie, MD;  Location: [place];  Service: General;  Laterality: Right;  . INGUINAL HERNIA  REPAIR Left 12/24/2016   Procedure: LEFT INGUINAL HERNIA REPAIR;  Surgeon: Coralie Keens, MD;  Location: [place];  Service: General;  Laterality: Left;  . INSERTION OF MESH Right 10/23/2013   Procedure: INSERTION OF MESH;  Surgeon: Harl Bowie, MD;  Location: [place];  Service: General;  Laterality: Right;  . INSERTION OF MESH Left 12/24/2016   Procedure: INSERTION OF MESH;  Surgeon: Coralie Keens, MD;  Location: [place];  Service: General;  Laterality: Left;  . JOINT REPLACEMENT Right    hip resurfacing  . REPAIR ANKLE LIGAMENT    . TONSILLECTOMY       Current Outpatient Medications  Medication Sig Dispense Refill  . aspirin EC 81 MG tablet Take 81 mg by mouth daily.    Marland Kitchen atorvastatin (LIPITOR) 80 MG tablet TAKE 1 TABLET(80 MG) BY MOUTH DAILY AT 6 PM 90 tablet 0  . Coenzyme Q10 (CO Q-10) 100 MG CAPS Take 100 mg by mouth 2 (two) times daily.    Marland Kitchen lisinopril (PRINIVIL,ZESTRIL) 2.5 MG tablet TAKE 1 TABLET BY MOUTH DAILY 90 tablet 3  . metoprolol succinate (TOPROL-XL) 25 MG 24 hr tablet Take 0.5 tablets (12.5 mg total) by mouth daily. NEED OV. 15 tablet 0  . nitroGLYCERIN (NITROSTAT) 0.4 MG SL tablet DISSOLVE 1 TABLET UNDER THE TONGUE UNDER THE TONGUE EVERY 5 MINUTES  AS NEEDED FOR CHEST PAIN 25 tablet 3   No current facility-administered medications for this visit.     Allergies:   No known allergies    ROS:  Please see the history of present illness.   Otherwise, review of systems are positive for none.   All other systems are reviewed and negative.    PHYSICAL EXAM: VS:  BP 128/77   Pulse (!) 59   Ht 5\' 11"  (1.803 m)   Wt 188 lb 3.2 oz (85.4 kg)   SpO2 98%   BMI 26.25 kg/m  , BMI Body mass index is 26.25 kg/m.  GENERAL:  Well appearing NECK:  No jugular venous distention, waveform within normal limits, carotid upstroke brisk and symmetric, no bruits, no thyromegaly LUNGS:  Clear to auscultation bilaterally CHEST:  Unremarkable HEART:  PMI not displaced or sustained,S1 and  S2 within normal limits, no S3, no S4, no clicks, no rubs, no murmurs ABD:  Flat, positive bowel sounds normal in frequency in pitch, no bruits, no rebound, no guarding, no midline pulsatile mass, no hepatomegaly, no splenomegaly EXT:  2 plus pulses throughout, no edema, no cyanosis no clubbing   EKG:  EKG is  ordered today.   Recent Labs: No results found for requested labs within last 8760 hours.    Lipid Panel    Component Value Date/Time   CHOL 101 08/09/2017 0819   TRIG 56 08/09/2017 0819   HDL 38 (L) 08/09/2017 0819   CHOLHDL 2.7 08/09/2017 0819   CHOLHDL 4.0 08/02/2015 1525   VLDL 13 08/02/2015 1525   LDLCALC 52 08/09/2017 0819        Wt Readings from Last 3 Encounters:  10/26/18 188 lb 3.2 oz (85.4 kg)  08/05/17 186 lb 9.6 oz (84.6 kg)  12/22/16 182 lb 14.4 oz (83 kg)      Other studies Reviewed: Additional studies/ records that were reviewed today include: None Review of the above records demonstrates:      ASSESSMENT AND PLAN:  CAD:  The patient has no new .  No change in therapy.   HTN:  The blood pressure is at target.  No change in therapy.   DYSLIPIDEMIA:  I will repeat a lipid profile.  No change in therapy.   Current medicines are reviewed at length with the patient today.  The patient does not have concerns regarding medicines.  The following changes have been made:  None  Labs/ tests ordered today include:  None    Disposition:   FU with me in 12 months.     Signed, Minus Breeding, MD  10/26/2018 8:15 AM    [facility]

## 2018-10-26 ENCOUNTER — Ambulatory Visit (INDEPENDENT_AMBULATORY_CARE_PROVIDER_SITE_OTHER): Payer: BLUE CROSS/BLUE SHIELD | Admitting: Cardiology

## 2018-10-26 ENCOUNTER — Encounter: Payer: Self-pay | Admitting: Cardiology

## 2018-10-26 VITALS — BP 128/77 | HR 59 | Ht 71.0 in | Wt 188.2 lb

## 2018-10-26 DIAGNOSIS — I2510 Atherosclerotic heart disease of native coronary artery without angina pectoris: Secondary | ICD-10-CM | POA: Diagnosis not present

## 2018-10-26 NOTE — Patient Instructions (Signed)

## 2018-11-12 ENCOUNTER — Other Ambulatory Visit: Payer: Self-pay | Admitting: Cardiology

## 2018-12-05 DIAGNOSIS — H524 Presbyopia: Secondary | ICD-10-CM | POA: Diagnosis not present

## 2019-01-23 ENCOUNTER — Other Ambulatory Visit: Payer: Self-pay | Admitting: Cardiology

## 2019-04-23 ENCOUNTER — Other Ambulatory Visit: Payer: Self-pay | Admitting: Cardiology

## 2019-07-23 ENCOUNTER — Other Ambulatory Visit: Payer: Self-pay | Admitting: Cardiology

## 2019-08-07 ENCOUNTER — Other Ambulatory Visit: Payer: Self-pay

## 2019-08-07 MED ORDER — METOPROLOL SUCCINATE ER 25 MG PO TB24: ORAL_TABLET | ORAL | 9 refills | Status: DC

## 2019-09-14 ENCOUNTER — Other Ambulatory Visit: Payer: Self-pay

## 2019-09-14 DIAGNOSIS — Z20822 Contact with and (suspected) exposure to covid-19: Secondary | ICD-10-CM

## 2019-09-16 LAB — NOVEL CORONAVIRUS, NAA: SARS-CoV-2, NAA: NOT DETECTED

## 2019-10-05 ENCOUNTER — Encounter: Payer: Self-pay | Admitting: Family Medicine

## 2019-10-18 ENCOUNTER — Ambulatory Visit (INDEPENDENT_AMBULATORY_CARE_PROVIDER_SITE_OTHER): Payer: PPO | Admitting: Family Medicine

## 2019-10-18 ENCOUNTER — Other Ambulatory Visit: Payer: Self-pay

## 2019-10-18 DIAGNOSIS — Z23 Encounter for immunization: Secondary | ICD-10-CM

## 2019-11-01 DIAGNOSIS — I2510 Atherosclerotic heart disease of native coronary artery without angina pectoris: Secondary | ICD-10-CM | POA: Insufficient documentation

## 2019-11-01 DIAGNOSIS — Z7189 Other specified counseling: Secondary | ICD-10-CM | POA: Insufficient documentation

## 2019-11-01 DIAGNOSIS — I10 Essential (primary) hypertension: Secondary | ICD-10-CM | POA: Insufficient documentation

## 2019-11-01 NOTE — Progress Notes (Signed)
Cardiology Office Note   Date:  11/02/2019   ID:  Bradley [name], DOB 1956-06-20, MRN [id number]  PCP:  Wendie Agreste, MD  Cardiologist:   Minus Breeding, MD     No chief complaint on file.     History of Present Illness: Bradley [name] is a 64 y.o. male who presents for follow-up of an anterior MI in 2016. He had a coronary calcium score in the past which put him at around the 40th percentile with some mild calcium in the LAD. He had a negative POET (Plain Old Exercise Treadmill). However he presented subsequently with  anterior MI.   He had stenting of proximal LAD. He did have some nonobstructive disease in his RCA as well. His ejection fraction was slightly reduced at 45%.  Follow up echo then showed that the EF was 50 - 55%.    Since I last saw him he has done well.  He still riding his bike been very active. The patient denies any new symptoms such as chest discomfort, neck or arm discomfort. There has been no new shortness of breath, PND or orthopnea. There have been no reported palpitations, presyncope or syncope.  He has been cleaning up [employer] recently.    Past Medical History:  Diagnosis Date  . CAD in native artery   . LV dysfunction    EF post MI 35-40%  . STEMI (ST elevation myocardial infarction) ([place])    ant wall,  Xience alpine to LAD      Past Surgical History:  Procedure Laterality Date  . CARDIAC CATHETERIZATION N/A 08/01/2015   Procedure: Left Heart Cath and Coronary Angiography;  Surgeon: Leonie Man, MD;  Location: [place] [facility];  Service: Cardiovascular;  Laterality: N/A;  . CARDIAC CATHETERIZATION N/A 08/01/2015   Procedure: Coronary Stent Intervention;  Surgeon: Leonie Man, MD;  Location: [place] [facility];  Service: Cardiovascular;  Laterality: N/A;  . HERNIA REPAIR    . INGUINAL HERNIA REPAIR Right 10/23/2013   Procedure: HERNIA REPAIR INGUINAL ADULT;  Surgeon: Harl Bowie, MD;  Location: [place];  Service: General;   Laterality: Right;  . INGUINAL HERNIA REPAIR Left 12/24/2016   Procedure: LEFT INGUINAL HERNIA REPAIR;  Surgeon: Coralie Keens, MD;  Location: [place];  Service: General;  Laterality: Left;  . INSERTION OF MESH Right 10/23/2013   Procedure: INSERTION OF MESH;  Surgeon: Harl Bowie, MD;  Location: [place];  Service: General;  Laterality: Right;  . INSERTION OF MESH Left 12/24/2016   Procedure: INSERTION OF MESH;  Surgeon: Coralie Keens, MD;  Location: [employer];  Service: General;  Laterality: Left;  . JOINT REPLACEMENT Right    hip resurfacing  . REPAIR ANKLE LIGAMENT    . TONSILLECTOMY       Current Outpatient Medications  Medication Sig Dispense Refill  . aspirin EC 81 MG tablet Take 81 mg by mouth daily.    Marland Kitchen atorvastatin (LIPITOR) 80 MG tablet TAKE 1 TABLET(80 MG) BY MOUTH DAILY AT 6 PM 90 tablet 2  . Coenzyme Q10 (CO Q-10) 100 MG CAPS Take 100 mg by mouth 2 (two) times daily.    Marland Kitchen lisinopril (ZESTRIL) 2.5 MG tablet TAKE 1 TABLET BY MOUTH DAILY 90 tablet 0  . metoprolol succinate (TOPROL-XL) 25 MG 24 hr tablet TAKE 1/2 TABLET(12.5 MG) BY MOUTH DAILY 15 tablet 9  . nitroGLYCERIN (NITROSTAT) 0.4 MG SL tablet DISSOLVE 1 TABLET UNDER THE TONGUE UNDER THE TONGUE  EVERY 5 MINUTES AS NEEDED FOR CHEST PAIN 25 tablet 3   No current facility-administered medications for this visit.    Allergies:   No known allergies    ROS:  Please see the history of present illness.   Otherwise, review of systems are positive for none.   All other systems are reviewed and negative.    PHYSICAL EXAM: VS:  BP 128/80   Pulse (!) 53   Temp (!) 97.2 F (36.2 C)   Ht 5\' 11"  (1.803 m)   Wt 189 lb 9.6 oz (86 kg)   SpO2 98%   BMI 26.44 kg/m  , BMI Body mass index is 26.44 kg/m.  GENERAL:  Well appearing NECK:  No jugular venous distention, waveform within normal limits, carotid upstroke brisk and symmetric, no bruits, no thyromegaly LUNGS:  Clear to auscultation bilaterally CHEST:  Unremarkable HEART:   PMI not displaced or sustained,S1 and S2 within normal limits, no S3, no S4, no clicks, no rubs, no murmurs ABD:  Flat, positive bowel sounds normal in frequency in pitch, no bruits, no rebound, no guarding, no midline pulsatile mass, no hepatomegaly, no splenomegaly EXT:  2 plus pulses throughout, no edema, no cyanosis no clubbing   EKG:  EKG is   ordered today. Normal sinus rhythm, rate 50, axis within normal limits, intervals within normal limits, no acute ST-T wave changes.  Recent Labs: No results found for requested labs within last 8760 hours.    Lipid Panel    Component Value Date/Time   CHOL 101 08/09/2017 0819   TRIG 56 08/09/2017 0819   HDL 38 (L) 08/09/2017 0819   CHOLHDL 2.7 08/09/2017 0819   CHOLHDL 4.0 08/02/2015 1525   VLDL 13 08/02/2015 1525   LDLCALC 52 08/09/2017 0819        Wt Readings from Last 3 Encounters:  11/02/19 189 lb 9.6 oz (86 kg)  10/26/18 188 lb 3.2 oz (85.4 kg)  08/05/17 186 lb 9.6 oz (84.6 kg)      Other studies Reviewed: Additional studies/ records that were reviewed today include: None Review of the above records demonstrates:      ASSESSMENT AND PLAN:  CAD:  The patient has no new sypmtoms.  No further cardiovascular testing is indicated.  We will continue with aggressive risk reduction and meds as listed.  HTN: The blood pressure is at target. No change in medications is indicated. We will continue with therapeutic lifestyle changes (TLC).  DYSLIPIDEMIA: Check a lipid profile liver enzymes today.   COVID EDUCATION:  We talked about the vaccine.  He says he will get the vaccine.  Current medicines are reviewed at length with the patient today.  The patient does not have concerns regarding medicines.  The following changes have been made:  None  Labs/ tests ordered today include:  BMET,CBC Lipid profile    Disposition:   FU with me in 12 months.     Signed, Minus Breeding, MD  11/02/2019 9:06 AM    [place]

## 2019-11-02 ENCOUNTER — Ambulatory Visit: Payer: PPO | Admitting: Cardiology

## 2019-11-02 ENCOUNTER — Encounter: Payer: Self-pay | Admitting: Cardiology

## 2019-11-02 ENCOUNTER — Other Ambulatory Visit: Payer: Self-pay

## 2019-11-02 VITALS — BP 128/80 | HR 53 | Temp 97.2°F | Ht 71.0 in | Wt 189.6 lb

## 2019-11-02 DIAGNOSIS — I10 Essential (primary) hypertension: Secondary | ICD-10-CM | POA: Diagnosis not present

## 2019-11-02 DIAGNOSIS — Z7189 Other specified counseling: Secondary | ICD-10-CM

## 2019-11-02 DIAGNOSIS — I2510 Atherosclerotic heart disease of native coronary artery without angina pectoris: Secondary | ICD-10-CM | POA: Diagnosis not present

## 2019-11-02 DIAGNOSIS — E785 Hyperlipidemia, unspecified: Secondary | ICD-10-CM

## 2019-11-02 LAB — HEPATIC FUNCTION PANEL
ALT: 25 IU/L (ref 0–44)
AST: 28 IU/L (ref 0–40)
Albumin: 4.4 g/dL (ref 3.8–4.8)
Alkaline Phosphatase: 47 IU/L (ref 39–117)
Bilirubin Total: 0.5 mg/dL (ref 0.0–1.2)
Bilirubin, Direct: 0.17 mg/dL (ref 0.00–0.40)
Total Protein: 6.4 g/dL (ref 6.0–8.5)

## 2019-11-02 LAB — CBC
Hematocrit: 45.3 % (ref 37.5–51.0)
Hemoglobin: 15.1 g/dL (ref 13.0–17.7)
MCH: 33.2 pg — ABNORMAL HIGH (ref 26.6–33.0)
MCHC: 33.3 g/dL (ref 31.5–35.7)
MCV: 100 fL — ABNORMAL HIGH (ref 79–97)
Platelets: 172 x10E3/uL (ref 150–450)
RBC: 4.55 x10E6/uL (ref 4.14–5.80)
RDW: 12 % (ref 11.6–15.4)
WBC: 5.5 x10E3/uL (ref 3.4–10.8)

## 2019-11-02 LAB — LIPID PANEL
Chol/HDL Ratio: 2.6 ratio (ref 0.0–5.0)
Cholesterol, Total: 97 mg/dL — ABNORMAL LOW (ref 100–199)
HDL: 37 mg/dL — ABNORMAL LOW
LDL Chol Calc (NIH): 50 mg/dL (ref 0–99)
Triglycerides: 35 mg/dL (ref 0–149)
VLDL Cholesterol Cal: 10 mg/dL (ref 5–40)

## 2019-11-02 LAB — BASIC METABOLIC PANEL
BUN/Creatinine Ratio: 11 (ref 10–24)
BUN: 12 mg/dL (ref 8–27)
CO2: 25 mmol/L (ref 20–29)
Calcium: 9.5 mg/dL (ref 8.6–10.2)
Chloride: 101 mmol/L (ref 96–106)
Creatinine, Ser: 1.13 mg/dL (ref 0.76–1.27)
GFR calc Af Amer: 78 mL/min/{1.73_m2} (ref >59)
GFR calc non Af Amer: 68 mL/min/{1.73_m2} (ref >59)
Glucose: 83 mg/dL (ref 65–99)
Potassium: 4.5 mmol/L (ref 3.5–5.2)
Sodium: 140 mmol/L (ref 134–144)

## 2019-11-02 NOTE — Patient Instructions (Signed)
Medication Instructions:  Your physician recommends that you continue on your current medications as directed. Please refer to the Current Medication list given to you today.  *If you need a refill on your cardiac medications before your next appointment, please call your pharmacy*  Lab Work: Your physician recommends that you return for lab work today:  [place]    LIVER FUNCTION TEST          If you have labs (blood work) drawn today and your tests are completely normal, you will receive your results only by: Marland Kitchen MyChart Message (if you have MyChart) OR . A paper copy in the mail If you have any lab test that is abnormal or we need to change your treatment, we will call you to review the results.  Testing/Procedures: None  Follow-Up: At [facility], you and your health needs are our priority.  As part of our continuing mission to provide you with exceptional heart care, we have created designated Provider Care Teams.  These Care Teams include your primary Cardiologist (physician) and Advanced Practice Providers (APPs -  Physician Assistants and Nurse Practitioners) who all work together to provide you with the care you need, when you need it.  Your next appointment:   12 month(s)  The format for your next appointment:   In Person  Provider:   Minus Breeding, MD

## 2019-11-20 ENCOUNTER — Other Ambulatory Visit: Payer: Self-pay | Admitting: Cardiology

## 2019-11-23 ENCOUNTER — Other Ambulatory Visit: Payer: Self-pay

## 2019-11-23 MED ORDER — LISINOPRIL 2.5 MG PO TABS: 2.5000 mg | ORAL_TABLET | Freq: Every day | ORAL | 0 refills | Status: DC

## 2020-01-18 ENCOUNTER — Other Ambulatory Visit: Payer: Self-pay | Admitting: Cardiology

## 2020-03-28 NOTE — Progress Notes (Signed)
Cardiology Office Note   Date:  03/29/2020   ID:  Bradley [name], DOB 08-May-1956, MRN [id number]  PCP:  Wendie Agreste, MD  Cardiologist:   Minus Breeding, MD     Chief Complaint  Patient presents with  . Coronary Artery Disease      History of Present Illness: Bradley [name] is a 64 y.o. male who presents for follow-up of an anterior MI in 2016. He had a coronary calcium score in the past which put him at around the 40th percentile with some mild calcium in the LAD. He had a negative POET (Plain Old Exercise Treadmill). However he presented subsequently with  anterior MI.   He had stenting of proximal LAD. He did have some nonobstructive disease in his RCA as well. His ejection fraction was slightly reduced at 45%.  Follow up echo then showed that the EF was 50 - 55%.    Since I last saw him he has done well.  He is working a lot does yard work.  He bikes still 1 to 2 hours at a time. The patient denies any new symptoms such as chest discomfort, neck or arm discomfort. There has been no new shortness of breath, PND or orthopnea. There have been no reported palpitations, presyncope or syncope.   Unfortunately his 60 year old brother died recently of a myocardial infarction.  Her had bypass.   Past Medical History:  Diagnosis Date  . CAD in native artery   . LV dysfunction    EF post MI 35-40%  . STEMI (ST elevation myocardial infarction) ([place])    ant wall,  Xience alpine to LAD      Past Surgical History:  Procedure Laterality Date  . CARDIAC CATHETERIZATION N/A 08/01/2015   Procedure: Left Heart Cath and Coronary Angiography;  Surgeon: Leonie Man, MD;  Location: [place] [facility];  Service: Cardiovascular;  Laterality: N/A;  . CARDIAC CATHETERIZATION N/A 08/01/2015   Procedure: Coronary Stent Intervention;  Surgeon: Leonie Man, MD;  Location: [place] [facility];  Service: Cardiovascular;  Laterality: N/A;  . HERNIA REPAIR    . INGUINAL HERNIA REPAIR Right  10/23/2013   Procedure: HERNIA REPAIR INGUINAL ADULT;  Surgeon: Harl Bowie, MD;  Location: [place];  Service: General;  Laterality: Right;  . INGUINAL HERNIA REPAIR Left 12/24/2016   Procedure: LEFT INGUINAL HERNIA REPAIR;  Surgeon: Coralie Keens, MD;  Location: [place];  Service: General;  Laterality: Left;  . INSERTION OF MESH Right 10/23/2013   Procedure: INSERTION OF MESH;  Surgeon: Harl Bowie, MD;  Location: [place];  Service: General;  Laterality: Right;  . INSERTION OF MESH Left 12/24/2016   Procedure: INSERTION OF MESH;  Surgeon: Coralie Keens, MD;  Location: [place];  Service: General;  Laterality: Left;  . JOINT REPLACEMENT Right    hip resurfacing  . REPAIR ANKLE LIGAMENT    . TONSILLECTOMY       Current Outpatient Medications  Medication Sig Dispense Refill  . aspirin EC 81 MG tablet Take 81 mg by mouth daily.    Marland Kitchen atorvastatin (LIPITOR) 80 MG tablet TAKE 1 TABLET(80 MG) BY MOUTH DAILY AT 6 PM 90 tablet 2  . Coenzyme Q10 (CO Q-10) 100 MG CAPS Take 100 mg by mouth 2 (two) times daily.    Marland Kitchen lisinopril (ZESTRIL) 2.5 MG tablet Take 1 tablet (2.5 mg total) by mouth daily. 90 tablet 0  . metoprolol succinate (TOPROL-XL) 25 MG 24 hr tablet  TAKE 1/2 TABLET(12.5 MG) BY MOUTH DAILY 15 tablet 9  . nitroGLYCERIN (NITROSTAT) 0.4 MG SL tablet DISSOLVE 1 TABLET UNDER THE TONGUE UNDER THE TONGUE EVERY 5 MINUTES AS NEEDED FOR CHEST PAIN 25 tablet 3   No current facility-administered medications for this visit.    Allergies:   No known allergies    ROS:  Please see the history of present illness.   Otherwise, review of systems are positive for none.   All other systems are reviewed and negative.    PHYSICAL EXAM: VS:  BP 124/70   Pulse (!) 51   Ht 5\' 11"  (1.803 m)   Wt 183 lb 12.8 oz (83.4 kg)   SpO2 98%   BMI 25.63 kg/m  , BMI Body mass index is 25.63 kg/m.  GENERAL:  Well appearing NECK:  No jugular venous distention, waveform within normal limits, carotid upstroke  brisk and symmetric, no bruits, no thyromegaly LUNGS:  Clear to auscultation bilaterally CHEST:  Unremarkable HEART:  PMI not displaced or sustained,S1 and S2 within normal limits, no S3, no S4, no clicks, no rubs, no murmurs ABD:  Flat, positive bowel sounds normal in frequency in pitch, no bruits, no rebound, no guarding, no midline pulsatile mass, no hepatomegaly, no splenomegaly EXT:  2 plus pulses throughout, no edema, no cyanosis no clubbing   EKG:  EKG is  ordered today. Normal sinus rhythm, rate 51, axis within normal limits, intervals within normal limits, no acute ST-T wave changes.  Recent Labs: 11/02/2019: ALT 25; BUN 12; Creatinine, Ser 1.13; Hemoglobin 15.1; Platelets 172; Potassium 4.5; Sodium 140    Lipid Panel    Component Value Date/Time   CHOL 97 (L) 11/02/2019 0922   TRIG 35 11/02/2019 0922   HDL 37 (L) 11/02/2019 0922   CHOLHDL 2.6 11/02/2019 0922   CHOLHDL 4.0 08/02/2015 1525   VLDL 13 08/02/2015 1525   LDLCALC 50 11/02/2019 0922        Wt Readings from Last 3 Encounters:  03/29/20 183 lb 12.8 oz (83.4 kg)  11/02/19 189 lb 9.6 oz (86 kg)  10/26/18 188 lb 3.2 oz (85.4 kg)      Other studies Reviewed: Additional studies/ records that were reviewed today include: Labs Review of the above records demonstrates:   See elsewhere   ASSESSMENT AND PLAN:  CAD: We had a long discussion today and I reviewed with him his anatomy from 2016.  We talked about the nonobstructive disease that he still had in his RCA 30%.  He was stented across 2 lesions in his LAD.  He has absolutely no symptoms and a very high functional level.  We talked about medical management and that there is really no indication in the absence of symptoms to be doing any angiography.   He would let me know if he ever develops any symptoms and I would have a very low threshold for further testing but until that time we need to pursue optimal medical therapy.  I think he is doing that.  He was very  satisfied with this conversation.   HTN:   His blood pressure is well controlled.  No change in therapy.  DYSLIPIDEMIA: His lipids have been excellent with an LDL of 50 and an HDL of 37.  I am going to check an LP(a).  He is taking over-the-counter fish oil but I might consider further management.   COVID EDUCATION:    He has had his vaccine.  Current medicines are reviewed at length with  the patient today.  The patient does not have concerns regarding medicines.  The following changes have been made:  None  Labs/ tests ordered today include: LP(a)    Disposition:   FU with me in 12 months.     Signed, Minus Breeding, MD  03/29/2020 10:48 AM    [facility]

## 2020-03-29 ENCOUNTER — Other Ambulatory Visit: Payer: Self-pay

## 2020-03-29 ENCOUNTER — Encounter: Payer: Self-pay | Admitting: Cardiology

## 2020-03-29 ENCOUNTER — Ambulatory Visit: Payer: PPO | Admitting: Cardiology

## 2020-03-29 VITALS — BP 124/70 | HR 51 | Ht 71.0 in | Wt 183.8 lb

## 2020-03-29 DIAGNOSIS — Z7189 Other specified counseling: Secondary | ICD-10-CM | POA: Diagnosis not present

## 2020-03-29 DIAGNOSIS — I10 Essential (primary) hypertension: Secondary | ICD-10-CM | POA: Diagnosis not present

## 2020-03-29 DIAGNOSIS — E785 Hyperlipidemia, unspecified: Secondary | ICD-10-CM

## 2020-03-29 DIAGNOSIS — I2510 Atherosclerotic heart disease of native coronary artery without angina pectoris: Secondary | ICD-10-CM

## 2020-03-29 NOTE — Patient Instructions (Signed)
Medication Instructions:  NO CHANGES *If you need a refill on your cardiac medications before your next appointment, please call your pharmacy*  Lab Work: Your physician recommends that you return for lab work: ([facility])  Testing/Procedures: NONE ORDERED THIS VISIT  Follow-Up: At [facility], you and your health needs are our priority.  As part of our continuing mission to provide you with exceptional heart care, we have created designated Provider Care Teams.  These Care Teams include your primary Cardiologist (physician) and Advanced Practice Providers (APPs -  Physician Assistants and Nurse Practitioners) who all work together to provide you with the care you need, when you need it.  Your next appointment:   12 month(s)  You will receive a reminder letter in the mail two months in advance. If you don't receive a letter, please call our office to schedule the follow-up appointment.  The format for your next appointment:   In Person  Provider:   Minus Breeding, MD

## 2020-04-12 NOTE — Addendum Note (Signed)
Addended by: Hinton Dyer on: 04/12/2020 01:06 PM   Modules accepted: Orders

## 2020-04-19 DIAGNOSIS — E785 Hyperlipidemia, unspecified: Secondary | ICD-10-CM | POA: Diagnosis not present

## 2020-04-20 LAB — LIPOPROTEIN A (LPA): Lipoprotein (a): 20.3 nmol/L (ref <75.0)

## 2020-05-08 ENCOUNTER — Other Ambulatory Visit: Payer: Self-pay | Admitting: Cardiology

## 2020-07-26 ENCOUNTER — Other Ambulatory Visit: Payer: Self-pay | Admitting: Cardiology

## 2020-07-29 ENCOUNTER — Other Ambulatory Visit: Payer: Self-pay | Admitting: Cardiology

## 2020-09-27 ENCOUNTER — Other Ambulatory Visit: Payer: Self-pay

## 2020-09-27 ENCOUNTER — Ambulatory Visit (INDEPENDENT_AMBULATORY_CARE_PROVIDER_SITE_OTHER): Payer: PPO | Admitting: Family Medicine

## 2020-09-27 DIAGNOSIS — Z23 Encounter for immunization: Secondary | ICD-10-CM | POA: Diagnosis not present

## 2020-10-09 ENCOUNTER — Encounter: Payer: Self-pay | Admitting: *Deleted

## 2020-12-09 ENCOUNTER — Encounter: Payer: Self-pay | Admitting: Family Medicine

## 2020-12-11 ENCOUNTER — Other Ambulatory Visit: Payer: Self-pay

## 2020-12-11 ENCOUNTER — Encounter: Payer: Self-pay | Admitting: Family Medicine

## 2020-12-11 ENCOUNTER — Ambulatory Visit: Payer: PPO | Admitting: Family Medicine

## 2020-12-11 VITALS — BP 138/78 | HR 64 | Temp 98.3°F | Ht 71.0 in | Wt 190.0 lb

## 2020-12-11 DIAGNOSIS — H9202 Otalgia, left ear: Secondary | ICD-10-CM | POA: Diagnosis not present

## 2020-12-11 DIAGNOSIS — H6012 Cellulitis of left external ear: Secondary | ICD-10-CM

## 2020-12-11 DIAGNOSIS — H6122 Impacted cerumen, left ear: Secondary | ICD-10-CM | POA: Diagnosis not present

## 2020-12-11 DIAGNOSIS — Z1211 Encounter for screening for malignant neoplasm of colon: Secondary | ICD-10-CM

## 2020-12-11 DIAGNOSIS — D225 Melanocytic nevi of trunk: Secondary | ICD-10-CM | POA: Diagnosis not present

## 2020-12-11 MED ORDER — AMOXICILLIN-POT CLAVULANATE 875-125 MG PO TABS: 1.0000 | ORAL_TABLET | Freq: Two times a day (BID) | ORAL | 0 refills | Status: DC

## 2020-12-11 MED ORDER — CIPRO HC 0.2-1 % OT SUSP: 3.0000 [drp] | Freq: Two times a day (BID) | OTIC | 0 refills | Status: DC

## 2020-12-11 NOTE — Patient Instructions (Addendum)
Some cerumen was removed today as I suspect there is a component of a cellulitis or infection within the ear canal as well.  Start oral antibiotic twice per day as well as some drops twice per day for the next 1 week.  Recheck in office in 5 days, sooner or urgent care if worse.  Let me know if there are questions.  I will refer you to dermatology to evaluate the area on your lower abdomen but does not appear concerning.  Cologuard was ordered.  Return to the clinic or go to the nearest emergency room if any of your symptoms worsen or new symptoms occur.   If you have lab work done today you will be contacted with your lab results within the next 2 weeks.  If you have not heard from [place] then please contact us. The fastest way to get your results is to register for My Chart.   IF you received an x-ray today, you will receive an invoice from [facility]. Please contact [facility] at [phone] with questions or concerns regarding your invoice.   IF you received labwork today, you will receive an invoice from [place] [facility]. Please contact [facility] at [phone] with questions or concerns regarding your invoice.   Our billing staff will not be able to assist you with questions regarding bills from these companies.  You will be contacted with the lab results as soon as they are available. The fastest way to get your results is to activate your My Chart account. Instructions are located on the last page of this paperwork. If you have not heard from [place] regarding the results in 2 weeks, please contact this office.

## 2020-12-11 NOTE — Progress Notes (Addendum)
Subjective:  Patient ID: Bradley [name], male    DOB: 04-May-1956  Age: 65 y.o. MRN: [id number]  CC:  Chief Complaint  Patient presents with  . Ear Pain    Pt reports starting about a week ago his L ear started being really sore and sound has been muffled. Pt reports taking Asprin and using hydrogen peroxide these seemed to help the pain, but the sound is still muffled. Appears to be a blockage in pt's left ear. Pt states he normally cleans his ear with Q-tips.  . Mass    Pt has a small bump above his R hip that he would like the provider to look at. Pt reports no pain bleeding or drainage from the bump. Bump doesn't seem to be growing per pt's report.    HPI Bradley [name] presents for  Multiple concerns above  L ear pain: Sore,  muffled past 1 week. Canal itself was sore.  Aspirin for soreness - pain has improved, almost gone today.  hydrogen peroxide has also been tried with bulb min white substance removed on q tip, small amt of blood noted on end of Q tip, noted this morning. No drainage/discharge from ear.  still sounds muffled.  No recent swimming. q tips daily.  No fever, min rhinorrhea, no sinus congestion/pressure.  No prior similar symptoms.   Right hip lump: Past month.  Notices when putting on belt.  No drainage.  No soreness.  No treatments.  Same size, slight redness around area - entire times, no changes.  No personal or fam hx of skin cancer.  Colon ca screening: No family history or personal hx of colon ca or polyps.  Screening options with colonoscopy versus Cologuard discussed. Discussed timing of repeat testing intervals if normal, as well as potential need for diagnostic Colonoscopy if positive Cologuard. Understanding expressed, and chose Cologuard.    History Patient Active Problem List   Diagnosis Date Noted  . Coronary artery disease involving native coronary artery of native heart without angina pectoris 11/01/2019  . Essential hypertension  11/01/2019  . Educated about COVID-19 virus infection 11/01/2019  . CAD S/P LAD DES 08/14/2015  . Dyslipidemia 08/14/2015  . Acute ST elevation myocardial infarction (STEMI) involving left anterior descending coronary artery (HCC) 08/02/2015  . Cardiomyopathy, ischemic:  08/02/2015  . Family history of early CAD 05/30/2015  . Right inguinal hernia 10/09/2013  . ARTHRITIS, RIGHT HIP 04/29/2009  . Pain in limb 08/30/2007  . UNEQUAL LEG LENGTH, ACQUIRED 08/30/2007   Past Medical History:  Diagnosis Date  . CAD in native artery   . LV dysfunction    EF post MI 35-40%  . STEMI (ST elevation myocardial infarction) (HCC)    ant wall,  Xience alpine to LAD     Past Surgical History:  Procedure Laterality Date  . CARDIAC CATHETERIZATION N/A 08/01/2015   Procedure: Left Heart Cath and Coronary Angiography;  Surgeon: Marykay Lex, MD;  Location: [facility] INVASIVE CV LAB;  Service: Cardiovascular;  Laterality: N/A;  . CARDIAC CATHETERIZATION N/A 08/01/2015   Procedure: Coronary Stent Intervention;  Surgeon: Marykay Lex, MD;  Location: [facility] INVASIVE CV LAB;  Service: Cardiovascular;  Laterality: N/A;  . HERNIA REPAIR    . INGUINAL HERNIA REPAIR Right 10/23/2013   Procedure: HERNIA REPAIR INGUINAL ADULT;  Surgeon: Shelly Rubenstein, MD;  Location: [facility] OR;  Service: General;  Laterality: Right;  . INGUINAL HERNIA REPAIR Left 12/24/2016   Procedure: LEFT INGUINAL HERNIA REPAIR;  Surgeon:  Coralie Keens, MD;  Location: [place];  Service: General;  Laterality: Left;  . INSERTION OF MESH Right 10/23/2013   Procedure: INSERTION OF MESH;  Surgeon: Harl Bowie, MD;  Location: [place];  Service: General;  Laterality: Right;  . INSERTION OF MESH Left 12/24/2016   Procedure: INSERTION OF MESH;  Surgeon: Coralie Keens, MD;  Location: [place];  Service: General;  Laterality: Left;  . JOINT REPLACEMENT Right    hip resurfacing  . REPAIR ANKLE LIGAMENT    . TONSILLECTOMY     Allergies  Allergen Reactions  .  No Known Allergies    Prior to Admission medications   Medication Sig Start Date End Date Taking? Authorizing Provider  aspirin EC 81 MG tablet Take 81 mg by mouth daily.   Yes [provider]  atorvastatin (LIPITOR) 80 MG tablet TAKE 1 TABLET(80 MG) BY MOUTH DAILY AT 6 PM 07/30/20  Yes Minus Breeding, MD  Coenzyme Q10 (CO Q-10) 100 MG CAPS Take 100 mg by mouth 2 (two) times daily.   Yes [provider]  lisinopril (ZESTRIL) 2.5 MG tablet TAKE 1 TABLET(2.5 MG) BY MOUTH DAILY 07/30/20  Yes Hochrein, Jeneen Rinks, MD  metoprolol succinate (TOPROL-XL) 25 MG 24 hr tablet TAKE 1/2 TABLET(12.5 MG) BY MOUTH DAILY 07/26/20  Yes Hochrein, Jeneen Rinks, MD  nitroGLYCERIN (NITROSTAT) 0.4 MG SL tablet DISSOLVE 1 TABLET UNDER THE TONGUE UNDER THE TONGUE EVERY 5 MINUTES AS NEEDED FOR CHEST PAIN 08/24/17  Yes Croitoru, Dani Gobble, MD   Social History   Socioeconomic History  . Marital status: Married    Spouse name: Not on file  . Number of children: 4  . Years of education: Not on file  . Highest education level: Not on file  Occupational History  . Occupation: Facilities manager  Tobacco Use  . Smoking status: Never Smoker  . Smokeless tobacco: Never Used  Vaping Use  . Vaping Use: Never used  Substance and Sexual Activity  . Alcohol use: Yes    Alcohol/week: 2.0 - 3.0 standard drinks    Types: 2 - 3 Cans of beer per week    Comment: occasion  . Drug use: No  . Sexual activity: Not on file  Other Topics Concern  . Not on file  Social History Narrative   Lives at home with wife.    Social Determinants of Health   Financial Resource Strain: Not on file  Food Insecurity: Not on file  Transportation Needs: Not on file  Physical Activity: Not on file  Stress: Not on file  Social Connections: Not on file  Intimate Partner Violence: Not on file    Review of Systems Per HPI.   Objective:   Vitals:   12/11/20 1107  BP: 138/78  Pulse: 64  Temp: 98.3 F (36.8 C)  TempSrc: Temporal  SpO2:  100%  Weight: 190 lb (86.2 kg)  Height: 5\' 11"  (1.803 m)     Physical Exam Vitals reviewed.  Constitutional:      Appearance: He is well-developed and well-nourished.  HENT:     Head: Normocephalic and atraumatic.     Right Ear: External ear normal.     Left Ear: External ear normal. Decreased hearing noted. No drainage or swelling. There is impacted cerumen.     Ears:     Comments: Initial exam of left ear, mastoid nontender, pinna nontender with traction, impacted light-colored cerumen in mid canal, unable to visualize TM.  No abrasion seen on external canal, no blood or  apparent discharge in canal. Eyes:     Extraocular Movements: EOM normal.     Pupils: Pupils are equal, round, and reactive to light.  Neck:     Vascular: No carotid bruit or JVD.  Cardiovascular:     Rate and Rhythm: Normal rate and regular rhythm.     Heart sounds: Normal heart sounds. No murmur heard.   Pulmonary:     Effort: Pulmonary effort is normal.     Breath sounds: Normal breath sounds. No rales.  Musculoskeletal:        General: No edema.  Skin:    General: Skin is warm and dry.     Comments: Right lower abdominal wall with 2 to 3 mm slightly elevated flesh-colored lesion with slightly darker central appearance and crusting.  No surrounding erythema or induration.  No discharge  Neurological:     General: No focal deficit present.     Mental Status: He is alert and oriented to person, place, and time.  Psychiatric:        Mood and Affect: Mood and affect and mood normal.        Behavior: Behavior normal.    Repeat exam: After lavage of the left canal.  Reports improved hearing, only minimal decrease in hearing at this time.  7 irritation, erythema of proximal canal along with small area of blood at approximately 10:00, irregularity of tissue in that area with possible small ulcer and prominent canal tissue.  Unable to visualize TM with some cerumen and fluid at base of canal.  Second MD exam  performed.    Over 30 minutes spent during visit, greater than 50% counseling and assimilation of information, chart review, and discussion of plan, along with 2nd MD exam.    Assessment & Plan:  JULIE PAOLINI is a 65 y.o. male . Left ear pain - Plan: Ear wax removal Impacted cerumen of left ear - Plan: Ear wax removal Cellulitis of left ear canal - Plan: amoxicillin-clavulanate (AUGMENTIN) 875-125 MG tablet, ciprofloxacin-hydrocortisone (CIPRO HC) OTIC suspension  -Suspect component of otitis externa/cellulitis with cerumen impaction.  Some improvement in hearing with cerumen lavage, residual erythema, small area of bleeding in canal concerning for cellulitis.  Unable to visualize TM.  Will treat with Augmentin, Cipro HC suspension with recheck in 5 days, RTC precautions given.  6:29 PM 12/12/20 addendum.  Note received from patient is Cipro HC out of stock, will prescribe Floxin otic.  Special screening for malignant neoplasms, colon - Plan: Cologuard  -Discussion of options as above  Nevus of abdominal wall - Plan: Ambulatory referral to Dermatology  -Possible seborrheic keratosis, small lesion, but will refer to dermatology for evaluation and likely excision.  Meds ordered this encounter  Medications  . amoxicillin-clavulanate (AUGMENTIN) 875-125 MG tablet    Sig: Take 1 tablet by mouth 2 (two) times daily.    Dispense:  14 tablet    Refill:  0  . ciprofloxacin-hydrocortisone (CIPRO HC) OTIC suspension    Sig: Place 3 drops into the left ear 2 (two) times daily for 7 days.    Dispense:  10 mL    Refill:  0   Patient Instructions   Some cerumen was removed today as I suspect there is a component of a cellulitis or infection within the ear canal as well.  Start oral antibiotic twice per day as well as some drops twice per day for the next 1 week.  Recheck in office in 5 days, sooner or urgent  care if worse.  Let me know if there are questions.  I will refer you to dermatology  to evaluate the area on your lower abdomen but does not appear concerning.  Cologuard was ordered.  Return to the clinic or go to the nearest emergency room if any of your symptoms worsen or new symptoms occur.   If you have lab work done today you will be contacted with your lab results within the next 2 weeks.  If you have not heard from [place] then please contact us. The fastest way to get your results is to register for My Chart.   IF you received an x-ray today, you will receive an invoice from [facility]. Please contact [facility] at [phone] with questions or concerns regarding your invoice.   IF you received labwork today, you will receive an invoice from [facility]. Please contact [facility] at [phone] with questions or concerns regarding your invoice.   Our billing staff will not be able to assist you with questions regarding bills from these companies.  You will be contacted with the lab results as soon as they are available. The fastest way to get your results is to activate your My Chart account. Instructions are located on the last page of this paperwork. If you have not heard from [place] regarding the results in 2 weeks, please contact this office.         Signed, Merri Ray, MD [facility]

## 2020-12-12 MED ORDER — OFLOXACIN 0.3 % OT SOLN: 5.0000 [drp] | Freq: Every day | OTIC | 0 refills | Status: DC

## 2020-12-12 NOTE — Addendum Note (Signed)
Addended by: Carlota Raspberry, Teon Hudnall R on: 12/12/2020 06:30 PM   Modules accepted: Orders

## 2020-12-16 DIAGNOSIS — L820 Inflamed seborrheic keratosis: Secondary | ICD-10-CM | POA: Diagnosis not present

## 2020-12-16 DIAGNOSIS — L821 Other seborrheic keratosis: Secondary | ICD-10-CM | POA: Diagnosis not present

## 2020-12-17 ENCOUNTER — Encounter: Payer: Self-pay | Admitting: Family Medicine

## 2020-12-18 ENCOUNTER — Ambulatory Visit: Payer: PPO | Admitting: Family Medicine

## 2020-12-19 ENCOUNTER — Other Ambulatory Visit: Payer: Self-pay

## 2020-12-19 ENCOUNTER — Telehealth (INDEPENDENT_AMBULATORY_CARE_PROVIDER_SITE_OTHER): Payer: PPO | Admitting: Family Medicine

## 2020-12-19 ENCOUNTER — Encounter: Payer: Self-pay | Admitting: Family Medicine

## 2020-12-19 VITALS — Ht 71.0 in | Wt 185.0 lb

## 2020-12-19 DIAGNOSIS — H6012 Cellulitis of left external ear: Secondary | ICD-10-CM

## 2020-12-19 DIAGNOSIS — H9202 Otalgia, left ear: Secondary | ICD-10-CM

## 2020-12-19 DIAGNOSIS — H6122 Impacted cerumen, left ear: Secondary | ICD-10-CM

## 2020-12-19 NOTE — Progress Notes (Signed)
Virtual Visit via Video Note  I connected with Bradley [name] on 12/19/20 at 7:24 PM by a video enabled telemedicine application and verified that I am speaking with the correct person using two identifiers.  Patient location: office My location: office.    I discussed the limitations, risks, security and privacy concerns of performing an evaluation and management service by telephone and the availability of in person appointments. I also discussed with the patient that there may be a patient responsible charge related to this service. The patient expressed understanding and agreed to proceed, consent obtained.  Chief complaint:  Chief Complaint  Patient presents with  . Follow-up    On L ear infection. Pt reports some improvement today. Pt states still having swelling in the L ear and has less hearing in the L ear. Pt states he is still using ear drops.      History of Present Illness: Bradley [name] is a 65 y.o. male   L ear infection: Follow up from visit 8 days ago Suspected otitis externa or early cellulitis as well as cerumen impaction treated with lavage. Some hearing improvement after lavage, but not back to baseline. Rx augmentin, cipro HC otic (was not available at pharmacy - changed to floxin otic - started 12/13/20) 5 gtts left ear once per day.   Feels like last 2 nights pain has improved. Not taking pain meds. Hearing is still diminished. Was improving, but the muffled sensation returned past few days. About 50% blocked. No blood or discharge.  No external ear rash or swelling.  Still using a qtip in ear (about 3 times per day, with tan/wax on q tip) , but less tender in canal. Still feels like it may be swollen in that ear.   Patient Active Problem List   Diagnosis Date Noted  . Coronary artery disease involving native coronary artery of native heart without angina pectoris 11/01/2019  . Essential hypertension 11/01/2019  . Educated about COVID-19 virus infection  11/01/2019  . CAD S/P LAD DES 08/14/2015  . Dyslipidemia 08/14/2015  . Acute ST elevation myocardial infarction (STEMI) involving left anterior descending coronary artery ([place]) 08/02/2015  . Cardiomyopathy, ischemic:  08/02/2015  . Family history of early CAD 05/30/2015  . Right inguinal hernia 10/09/2013  . ARTHRITIS, RIGHT HIP 04/29/2009  . Pain in limb 08/30/2007  . UNEQUAL LEG LENGTH, ACQUIRED 08/30/2007   Past Medical History:  Diagnosis Date  . CAD in native artery   . LV dysfunction    EF post MI 35-40%  . STEMI (ST elevation myocardial infarction) ([place])    ant wall,  Xience alpine to LAD     Past Surgical History:  Procedure Laterality Date  . CARDIAC CATHETERIZATION N/A 08/01/2015   Procedure: Left Heart Cath and Coronary Angiography;  Surgeon: Leonie Man, MD;  Location: [place] CV LAB;  Service: Cardiovascular;  Laterality: N/A;  . CARDIAC CATHETERIZATION N/A 08/01/2015   Procedure: Coronary Stent Intervention;  Surgeon: Leonie Man, MD;  Location: [place] CV LAB;  Service: Cardiovascular;  Laterality: N/A;  . HERNIA REPAIR    . INGUINAL HERNIA REPAIR Right 10/23/2013   Procedure: HERNIA REPAIR INGUINAL ADULT;  Surgeon: Harl Bowie, MD;  Location: [place];  Service: General;  Laterality: Right;  . INGUINAL HERNIA REPAIR Left 12/24/2016   Procedure: LEFT INGUINAL HERNIA REPAIR;  Surgeon: Coralie Keens, MD;  Location: [place];  Service: General;  Laterality: Left;  . INSERTION OF MESH Right 10/23/2013  Procedure: INSERTION OF MESH;  Surgeon: Harl Bowie, MD;  Location: [place];  Service: General;  Laterality: Right;  . INSERTION OF MESH Left 12/24/2016   Procedure: INSERTION OF MESH;  Surgeon: Coralie Keens, MD;  Location: [place];  Service: General;  Laterality: Left;  . JOINT REPLACEMENT Right    hip resurfacing  . REPAIR ANKLE LIGAMENT    . TONSILLECTOMY     Allergies  Allergen Reactions  . No Known Allergies    Prior to Admission medications    Medication Sig Start Date End Date Taking? Authorizing Provider  amoxicillin-clavulanate (AUGMENTIN) 875-125 MG tablet Take 1 tablet by mouth 2 (two) times daily. 12/11/20  Yes Wendie Agreste, MD  aspirin EC 81 MG tablet Take 81 mg by mouth daily.   Yes [provider]  atorvastatin (LIPITOR) 80 MG tablet TAKE 1 TABLET(80 MG) BY MOUTH DAILY AT 6 PM 07/30/20  Yes Minus Breeding, MD  Coenzyme Q10 (CO Q-10) 100 MG CAPS Take 100 mg by mouth 2 (two) times daily.   Yes [provider]  lisinopril (ZESTRIL) 2.5 MG tablet TAKE 1 TABLET(2.5 MG) BY MOUTH DAILY 07/30/20  Yes Hochrein, Jeneen Rinks, MD  metoprolol succinate (TOPROL-XL) 25 MG 24 hr tablet TAKE 1/2 TABLET(12.5 MG) BY MOUTH DAILY 07/26/20  Yes Hochrein, Jeneen Rinks, MD  nitroGLYCERIN (NITROSTAT) 0.4 MG SL tablet DISSOLVE 1 TABLET UNDER THE TONGUE UNDER THE TONGUE EVERY 5 MINUTES AS NEEDED FOR CHEST PAIN 08/24/17  Yes Croitoru, Mihai, MD  ofloxacin (FLOXIN OTIC) 0.3 % OTIC solution Place 5 drops into the left ear daily. 12/12/20  Yes Wendie Agreste, MD   Social History   Socioeconomic History  . Marital status: Married    Spouse name: Not on file  . Number of children: 4  . Years of education: Not on file  . Highest education level: Not on file  Occupational History  . Occupation: Facilities manager  Tobacco Use  . Smoking status: Never Smoker  . Smokeless tobacco: Never Used  Vaping Use  . Vaping Use: Never used  Substance and Sexual Activity  . Alcohol use: Yes    Alcohol/week: 2.0 - 3.0 standard drinks    Types: 2 - 3 Cans of beer per week    Comment: occasion  . Drug use: No  . Sexual activity: Not on file  Other Topics Concern  . Not on file  Social History Narrative   Lives at home with wife.    Social Determinants of Health   Financial Resource Strain: Not on file  Food Insecurity: Not on file  Transportation Needs: Not on file  Physical Activity: Not on file  Stress: Not on file  Social Connections: Not on  file  Intimate Partner Violence: Not on file    Observations/Objective: Vitals:   12/19/20 1559  Weight: 185 lb (83.9 kg)  Height: 5\' 11"  (1.803 m)  No distress on video, no apparent facial swelling, appropriate responses, all questions were answered.  Understanding of plan expressed.   Assessment and Plan: Left ear pain - Plan: Ambulatory referral to ENT  Impacted cerumen of left ear - Plan: Ambulatory referral to ENT  Cellulitis of left ear canal - Plan: Ambulatory referral to ENT  -Some initial improvement as well as improvement in pain.  Does report return of some blocked/muffled sensation.  Advised to stop using cotton tip swabs within ear canal, increase Floxin otic to 10 drops for now and will refer to ENT for evaluation hopefully tomorrow or  Monday of next week.  Urgent care precautions if any increasing pain or worsening symptoms.  Follow Up Instructions: ENT with urgent care precautions.   I discussed the assessment and treatment plan with the patient. The patient was provided an opportunity to ask questions and all were answered. The patient agreed with the plan and demonstrated an understanding of the instructions.   The patient was advised to call back or seek an in-person evaluation if the symptoms worsen or if the condition fails to improve as anticipated.  I provided 18 minutes of non-face-to-face time during this encounter.   Wendie Agreste, MD\

## 2020-12-19 NOTE — Patient Instructions (Addendum)
  Increase drops to 10 drops per day, avoid Qtips in the ear, and I will try to have you seen by ENT soon. If any worsening symptoms, be seen here or Urgent care.    If you have lab work done today you will be contacted with your lab results within the next 2 weeks.  If you have not heard from [place] then please contact us. The fastest way to get your results is to register for My Chart.   IF you received an x-ray today, you will receive an invoice from [facility]. Please contact [facility] at [phone] with questions or concerns regarding your invoice.   IF you received labwork today, you will receive an invoice from [place]. Please contact [facility] at [phone] with questions or concerns regarding your invoice.   Our billing staff will not be able to assist you with questions regarding bills from these companies.  You will be contacted with the lab results as soon as they are available. The fastest way to get your results is to activate your My Chart account. Instructions are located on the last page of this paperwork. If you have not heard from [place] regarding the results in 2 weeks, please contact this office.

## 2020-12-21 DIAGNOSIS — Z1211 Encounter for screening for malignant neoplasm of colon: Secondary | ICD-10-CM | POA: Diagnosis not present

## 2020-12-23 ENCOUNTER — Encounter: Payer: Self-pay | Admitting: Family Medicine

## 2020-12-28 LAB — COLOGUARD: COLOGUARD: NEGATIVE

## 2020-12-28 LAB — EXTERNAL GENERIC LAB PROCEDURE: COLOGUARD: NEGATIVE

## 2020-12-30 DIAGNOSIS — H6122 Impacted cerumen, left ear: Secondary | ICD-10-CM | POA: Diagnosis not present

## 2020-12-30 DIAGNOSIS — H903 Sensorineural hearing loss, bilateral: Secondary | ICD-10-CM | POA: Diagnosis not present

## 2021-01-06 ENCOUNTER — Encounter: Payer: Self-pay | Admitting: Family Medicine

## 2021-01-07 ENCOUNTER — Telehealth: Payer: Self-pay | Admitting: Family Medicine

## 2021-01-07 NOTE — Telephone Encounter (Signed)
Pt would like a cb concerning his Cologuard results. Please advise at [phone].

## 2021-01-08 LAB — COLOGUARD: Cologuard: NEGATIVE

## 2021-01-08 NOTE — Telephone Encounter (Signed)
Results are back, but hasn't been reviewed by provider yet. Results seem to show negative will contact pt once the results have been reviewed.

## 2021-01-27 DIAGNOSIS — H903 Sensorineural hearing loss, bilateral: Secondary | ICD-10-CM | POA: Diagnosis not present

## 2021-03-26 NOTE — Progress Notes (Addendum)
Cardiology Office Note   Date:  03/27/2021   ID:  Bradley [name], DOB 1956-06-08, MRN [id number]  PCP:  Shade Flood, MD  Cardiologist:   Rollene Rotunda, MD     Chief Complaint  Patient presents with  . Coronary Artery Disease      History of Present Illness: Bradley [name] is a 65 y.o. male who presents for follow-up of an anterior MI in 2016. He had a coronary calcium score in the past which put him at around the 40th percentile with some mild calcium in the LAD. He had a negative POET (Plain Old Exercise Treadmill). However he presented subsequently with  anterior MI.   He had stenting of proximal LAD. He did have some nonobstructive disease in his RCA as well. His ejection fraction was slightly reduced at 45%.  Follow up echo then showed that the EF was 50 - 55%.    Since I last saw him he he has done well.  He is very vigorously active. The patient denies any new symptoms such as chest discomfort, neck or arm discomfort. There has been no new shortness of breath, PND or orthopnea. There have been no reported palpitations, presyncope or syncope.    Past Medical History:  Diagnosis Date  . CAD in native artery   . LV dysfunction    EF post MI 35-40%  . STEMI (ST elevation myocardial infarction) (HCC)    ant wall,  Xience alpine to LAD      Past Surgical History:  Procedure Laterality Date  . CARDIAC CATHETERIZATION N/A 08/01/2015   Procedure: Left Heart Cath and Coronary Angiography;  Surgeon: Marykay Lex, MD;  Location: [facility] INVASIVE CV LAB;  Service: Cardiovascular;  Laterality: N/A;  . CARDIAC CATHETERIZATION N/A 08/01/2015   Procedure: Coronary Stent Intervention;  Surgeon: Marykay Lex, MD;  [facility] INVASIVE CV LAB;  Service: Cardiovascular;  Laterality: N/A;  . HERNIA REPAIR    . INGUINAL HERNIA REPAIR Right 10/23/2013   Procedure: HERNIA REPAIR INGUINAL ADULT;  Surgeon: Shelly Rubenstein, MD;  Location: [facility] OR;  Service: General;  Laterality: Right;  .  INGUINAL HERNIA REPAIR Left 12/24/2016   Procedure: LEFT INGUINAL HERNIA REPAIR;  Surgeon: Abigail Miyamoto, MD;  Location: [facility] OR;  Service: General;  Laterality: Left;  . INSERTION OF MESH Right 10/23/2013   Procedure: INSERTION OF MESH;  Surgeon: Shelly Rubenstein, MD;  Location: [facility] OR;  Service: General;  Laterality: Right;  . INSERTION OF MESH Left 12/24/2016   Procedure: INSERTION OF MESH;  Surgeon: Abigail Miyamoto, MD;  Location: [facility] OR;  Service: General;  Laterality: Left;  . JOINT REPLACEMENT Right    hip resurfacing  . REPAIR ANKLE LIGAMENT    . TONSILLECTOMY       Current Outpatient Medications  Medication Sig Dispense Refill  . aspirin EC 81 MG tablet Take 81 mg by mouth daily.    Marland Kitchen atorvastatin (LIPITOR) 80 MG tablet TAKE 1 TABLET(80 MG) BY MOUTH DAILY AT 6 PM 90 tablet 3  . clopidogrel (PLAVIX) 75 MG tablet Take 75 mg by mouth daily.    . Coenzyme Q10 (CO Q-10) 100 MG CAPS Take 100 mg by mouth 2 (two) times daily.    Marland Kitchen lisinopril (ZESTRIL) 2.5 MG tablet TAKE 1 TABLET(2.5 MG) BY MOUTH DAILY 90 tablet 3  . metoprolol succinate (TOPROL-XL) 25 MG 24 hr tablet TAKE 1/2 TABLET(12.5 MG) BY MOUTH DAILY 15 tablet 8  . nitroGLYCERIN (NITROSTAT) 0.4  MG SL tablet DISSOLVE 1 TABLET UNDER THE TONGUE UNDER THE TONGUE EVERY 5 MINUTES AS NEEDED FOR CHEST PAIN 25 tablet 3   No current facility-administered medications for this visit.    Allergies:   No known allergies    ROS:  Please see the history of present illness.   Otherwise, review of systems are positive for none.   All other systems are reviewed and negative.    PHYSICAL EXAM: VS:  BP 124/76   Pulse (!) 55   Ht $R'5\' 11"'vf$  (1.803 m)   Wt 192 lb 12.8 oz (87.5 kg)   BMI 26.89 kg/m  , BMI Body mass index is 26.89 kg/m.  GENERAL:  Well appearing NECK:  No jugular venous distention, waveform within normal limits, carotid upstroke brisk and symmetric, no bruits, no thyromegaly LUNGS:  Clear to auscultation bilaterally CHEST:   Unremarkable HEART:  PMI not displaced or sustained,S1 and S2 within normal limits, no S3, no S4, no clicks, no rubs, no murmurs ABD:  Flat, positive bowel sounds normal in frequency in pitch, no bruits, no rebound, no guarding, no midline pulsatile mass, no hepatomegaly, no splenomegaly EXT:  2 plus pulses throughout, no edema, no cyanosis no clubbing   EKG:  EKG is   ordered today. Normal sinus rhythm, rate 55, axis within normal limits, intervals within normal limits, no acute ST-T wave changes.  Recent Labs: No results found for requested labs within last 8760 hours.    Lipid Panel    Component Value Date/Time   CHOL 97 (L) 11/02/2019 0922   TRIG 35 11/02/2019 0922   HDL 37 (L) 11/02/2019 0922   CHOLHDL 2.6 11/02/2019 0922   CHOLHDL 4.0 08/02/2015 1525   VLDL 13 08/02/2015 1525   LDLCALC 50 11/02/2019 0922        Wt Readings from Last 3 Encounters:  03/27/21 192 lb 12.8 oz (87.5 kg)  12/19/20 185 lb (83.9 kg)  12/11/20 190 lb (86.2 kg)      Other studies Reviewed: Additional studies/ records that were reviewed today include: Labs Review of the above records demonstrates:   See elsewhere   ASSESSMENT AND PLAN:  CAD: The patient has no new sypmtoms.  No further cardiovascular testing is indicated.  We will continue with aggressive risk reduction and meds as listed.  Given the nature about his LAD stenting and an acute MI with overlapping stents I have elected to keep him indefinitely on dual antiplatelets since he has had no contraindication  HTN:   His blood pressure is controlled.  No change in therapy.   DYSLIPIDEMIA: LDL was excellent in 2020 but I will repeat lipid profile, c-Met and draw CBC.  Of note his LP(a) previously was normal.   DECREASED EF: He did have a previously reduced ejection fraction that improved to low normal.  Given this low normal echocardiogram EF and his sisters diagnosis I would like to repeat an echocardiogram.  Current medicines are  reviewed at length with the patient today.  The patient does not have concerns regarding medicines.  The following changes have been made:  None  Labs/ tests ordered today include:    Disposition:   FU with me in 12 months.     Signed, Minus Breeding, MD  03/27/2021 8:23 AM    [facility]

## 2021-03-27 ENCOUNTER — Ambulatory Visit: Payer: PPO | Admitting: Cardiology

## 2021-03-27 ENCOUNTER — Other Ambulatory Visit: Payer: Self-pay

## 2021-03-27 ENCOUNTER — Encounter: Payer: Self-pay | Admitting: Cardiology

## 2021-03-27 VITALS — BP 124/76 | HR 55 | Ht 71.0 in | Wt 182.0 lb

## 2021-03-27 DIAGNOSIS — E785 Hyperlipidemia, unspecified: Secondary | ICD-10-CM | POA: Diagnosis not present

## 2021-03-27 DIAGNOSIS — I10 Essential (primary) hypertension: Secondary | ICD-10-CM

## 2021-03-27 DIAGNOSIS — Z79899 Other long term (current) drug therapy: Secondary | ICD-10-CM | POA: Diagnosis not present

## 2021-03-27 DIAGNOSIS — I2510 Atherosclerotic heart disease of native coronary artery without angina pectoris: Secondary | ICD-10-CM | POA: Diagnosis not present

## 2021-03-27 DIAGNOSIS — I255 Ischemic cardiomyopathy: Secondary | ICD-10-CM | POA: Diagnosis not present

## 2021-03-27 NOTE — Patient Instructions (Signed)
Medication Instructions:  Continue current medications  *If you need a refill on your cardiac medications before your next appointment, please call your pharmacy*   Lab Work: Fasting Lipid, CMP and CBC  If you have labs (blood work) drawn today and your tests are completely normal, you will receive your results only by: [place] MyChart Message (if you have MyChart) OR . A paper copy in the mail If you have any lab test that is abnormal or we need to change your treatment, we will call you to review the results.   Testing/Procedures: Your physician has requested that you have an echocardiogram. Echocardiography is a painless test that uses sound waves to create images of your heart. It provides your doctor with information about the size and shape of your heart and how well your heart's chambers and valves are working. This procedure takes approximately one hour. There are no restrictions for this procedure.   Follow-Up: At [facility], you and your health needs are our priority.  As part of our continuing mission to provide you with exceptional heart care, we have created designated Provider Care Teams.  These Care Teams include your primary Cardiologist (physician) and Advanced Practice Providers (APPs -  Physician Assistants and Nurse Practitioners) who all work together to provide you with the care you need, when you need it.  We recommend signing up for the patient portal called "MyChart".  Sign up information is provided on this After Visit Summary.  MyChart is used to connect with patients for Virtual Visits (Telemedicine).  Patients are able to view lab/test results, encounter notes, upcoming appointments, etc.  Non-urgent messages can be sent to your provider as well.   To learn more about what you can do with MyChart, go to [email or web address].    Your next appointment:   1 year(s)  The format for your next appointment:   In Person  Provider:   You may see Minus Breeding, MD  or one of the following Advanced Practice Providers on your designated Care Team:    Rosaria Ferries, PA-C  Jory Sims, DNP, ANP

## 2021-03-29 ENCOUNTER — Telehealth: Payer: Self-pay | Admitting: Family Medicine

## 2021-03-29 NOTE — Telephone Encounter (Signed)
On call note: Received call from [facility] RN stating pt called in to report pt with cough x 2 days. + covid test. No fever, SOB, DOE. Pt has significant cardiac hx - CAD, HTN, h/o MI, ischemic cardiomyopathy.  Rx called to pts phamracy for paxlovid 1 dose (3 tabs) po BID x 5 days

## 2021-03-31 ENCOUNTER — Telehealth: Payer: Self-pay | Admitting: Family Medicine

## 2021-03-31 NOTE — Telephone Encounter (Signed)
Called [facility] on 03/29/21  Pt states that -Caller stated Just tested positive for covid. Caller stated that Has cough, sore throat, headache. Caller stated that symptoms started yesterday  He is  requesting Prescription for covid as his heart doctor said he would need it if he got it.  Please advise if pt needs an appt with pcp

## 2021-03-31 NOTE — Telephone Encounter (Signed)
Virtual visit with any provider on 5/10 to check status and discuss covid treatments.

## 2021-04-01 ENCOUNTER — Encounter: Payer: Self-pay | Admitting: Registered Nurse

## 2021-04-01 ENCOUNTER — Other Ambulatory Visit: Payer: Self-pay

## 2021-04-01 ENCOUNTER — Telehealth (INDEPENDENT_AMBULATORY_CARE_PROVIDER_SITE_OTHER): Payer: PPO | Admitting: Registered Nurse

## 2021-04-01 DIAGNOSIS — U071 COVID-19: Secondary | ICD-10-CM

## 2021-04-01 NOTE — Progress Notes (Signed)
Telemedicine Encounter- SOAP NOTE Established Patient  This telephone encounter was conducted with the patient's (or proxy's) verbal consent via audio telecommunications: yes  Patient was instructed to have this encounter in a suitably private space; and to only have persons present to whom they give permission to participate. In addition, patient identity was confirmed by use of name plus two identifiers (DOB and address).  I discussed the limitations, risks, security and privacy concerns of performing an evaluation and management service by telephone and the availability of in person appointments. I also discussed with the patient that there may be a patient responsible charge related to this service. The patient expressed understanding and agreed to proceed.  I spent a total of 15 minutes talking with the patient or their proxy.  Patient at home Provider in office  Participants: Kathrin Ruddy, NP and Vicenta Aly  Chief Complaint  Patient presents with   Covid Positive    Patient states he tested positive for Covid on on last Saturday. Patient states he was having a sore throat and a cough but is starting to feel better now.    Subjective   Bradley [name] is a 65 y.o. established patient. Telephone visit today for COVID+  HPI Tested positive last weekend. Had sore throat, some congestion. Has been pursuing OTC and supportive care with good effect Feeling much improved  He is not interested in rx care as much as he would like to discuss ER precautions, isolation guidelines, and common sequelae for covid.  Patient Active Problem List   Diagnosis Date Noted   Coronary artery disease involving native coronary artery of native heart without angina pectoris 11/01/2019   Essential hypertension 11/01/2019   Educated about COVID-19 virus infection 11/01/2019   CAD S/P LAD DES 08/14/2015   Dyslipidemia 08/14/2015   Acute ST elevation myocardial infarction (STEMI) involving left  anterior descending coronary artery ([place]) 08/02/2015   Cardiomyopathy, ischemic:  08/02/2015   Family history of early CAD 05/30/2015   Right inguinal hernia 10/09/2013   ARTHRITIS, RIGHT HIP 04/29/2009   Pain in limb 08/30/2007   UNEQUAL LEG LENGTH, ACQUIRED 08/30/2007    Past Medical History:  Diagnosis Date   CAD in native artery    LV dysfunction    EF post MI 35-40%   STEMI (ST elevation myocardial infarction) ([place])    ant wall,  Xience alpine to LAD      Current Outpatient Medications  Medication Sig Dispense Refill   aspirin EC 81 MG tablet Take 81 mg by mouth daily.     atorvastatin (LIPITOR) 80 MG tablet TAKE 1 TABLET(80 MG) BY MOUTH DAILY AT 6 PM 90 tablet 3   clopidogrel (PLAVIX) 75 MG tablet Take 75 mg by mouth daily.     Coenzyme Q10 (CO Q-10) 100 MG CAPS Take 100 mg by mouth 2 (two) times daily.     lisinopril (ZESTRIL) 2.5 MG tablet TAKE 1 TABLET(2.5 MG) BY MOUTH DAILY 90 tablet 3   metoprolol succinate (TOPROL-XL) 25 MG 24 hr tablet TAKE 1/2 TABLET(12.5 MG) BY MOUTH DAILY 15 tablet 8   nitroGLYCERIN (NITROSTAT) 0.4 MG SL tablet DISSOLVE 1 TABLET UNDER THE TONGUE UNDER THE TONGUE EVERY 5 MINUTES AS NEEDED FOR CHEST PAIN 25 tablet 3   No current facility-administered medications for this visit.    Allergies  Allergen Reactions   No Known Allergies     Social History   Socioeconomic History   Marital status: Married    Spouse  name: Not on file   Number of children: 4   Years of education: Not on file   Highest education level: Not on file  Occupational History   Occupation: Facilities manager  Tobacco Use   Smoking status: Never Smoker   Smokeless tobacco: Never Used  Vaping Use   Vaping Use: Never used  Substance and Sexual Activity   Alcohol use: Yes    Alcohol/week: 2.0 - 3.0 standard drinks    Types: 2 - 3 Cans of beer per week    Comment: occasion   Drug use: No   Sexual activity: Not on file  Other Topics Concern   Not on file  Social  History Narrative   Lives at home with wife.    Social Determinants of Health   Financial Resource Strain: Not on file  Food Insecurity: Not on file  Transportation Needs: Not on file  Physical Activity: Not on file  Stress: Not on file  Social Connections: Not on file  Intimate Partner Violence: Not on file    ROS Per hpi   Objective   Vitals as reported by the patient: There were no vitals filed for this visit.  There are no diagnoses linked to this encounter.  PLAN Reviewed ER precautions and isolation guidelines with pt Discussed disease process and common sequelae from COVID infection with patient Questions answered and concerns addressed.   I discussed the assessment and treatment plan with the patient. The patient was provided an opportunity to ask questions and all were answered. The patient agreed with the plan and demonstrated an understanding of the instructions.   The patient was advised to call back or seek an in-person evaluation if the symptoms worsen or if the condition fails to improve as anticipated.  I provided 15 minutes of non-face-to-face time during this encounter.  Maximiano Coss, NP

## 2021-04-01 NOTE — Telephone Encounter (Signed)
I have LM asking pt to call back to schedule an appt ELEA  

## 2021-04-01 NOTE — Patient Instructions (Signed)
° ° ° °  If you have lab work done today you will be contacted with your lab results within the next 2 weeks.  If you have not heard from us then please contact us. The fastest way to get your results is to register for My Chart. ° ° °IF you received an x-ray today, you will receive an invoice from [facility]. Please contact [facility] at [phone] with questions or concerns regarding your invoice.  ° °IF you received labwork today, you will receive an invoice from [facility]. Please contact [facility] at [phone] with questions or concerns regarding your invoice.  ° °Our billing staff will not be able to assist you with questions regarding bills from these companies. ° °You will be contacted with the lab results as soon as they are available. The fastest way to get your results is to activate your My Chart account. Instructions are located on the last page of this paperwork. If you have not heard from us regarding the results in 2 weeks, please contact this office. °  ° ° ° °

## 2021-04-21 ENCOUNTER — Other Ambulatory Visit: Payer: Self-pay | Admitting: Cardiology

## 2021-04-22 ENCOUNTER — Other Ambulatory Visit: Payer: Self-pay

## 2021-04-22 ENCOUNTER — Ambulatory Visit (HOSPITAL_COMMUNITY): Payer: PPO | Attending: Internal Medicine

## 2021-04-22 DIAGNOSIS — I255 Ischemic cardiomyopathy: Secondary | ICD-10-CM | POA: Diagnosis not present

## 2021-04-22 DIAGNOSIS — I2510 Atherosclerotic heart disease of native coronary artery without angina pectoris: Secondary | ICD-10-CM

## 2021-04-22 LAB — ECHOCARDIOGRAM COMPLETE
Area-P 1/2: 2.5 cm2
P 1/2 time: 655 msec
S' Lateral: 3.85 cm

## 2021-04-25 ENCOUNTER — Telehealth: Payer: Self-pay | Admitting: Cardiology

## 2021-04-25 NOTE — Telephone Encounter (Signed)
Patient is returning call to discuss echo results. °

## 2021-04-25 NOTE — Telephone Encounter (Signed)
Spoke to patient recent echo results given.Results sent to PCP Dr.Jeffrey Carlota Raspberry.

## 2021-04-30 DIAGNOSIS — I10 Essential (primary) hypertension: Secondary | ICD-10-CM | POA: Diagnosis not present

## 2021-04-30 DIAGNOSIS — E785 Hyperlipidemia, unspecified: Secondary | ICD-10-CM | POA: Diagnosis not present

## 2021-04-30 DIAGNOSIS — Z79899 Other long term (current) drug therapy: Secondary | ICD-10-CM | POA: Diagnosis not present

## 2021-04-30 LAB — LIPID PANEL
Chol/HDL Ratio: 2.8 ratio (ref 0.0–5.0)
Cholesterol, Total: 101 mg/dL (ref 100–199)
HDL: 36 mg/dL — ABNORMAL LOW (ref >39)
LDL Chol Calc (NIH): 54 mg/dL (ref 0–99)
Triglycerides: 44 mg/dL (ref 0–149)
VLDL Cholesterol Cal: 11 mg/dL (ref 5–40)

## 2021-04-30 LAB — COMPREHENSIVE METABOLIC PANEL
ALT: 21 IU/L (ref 0–44)
AST: 27 IU/L (ref 0–40)
Albumin/Globulin Ratio: 2.1 (ref 1.2–2.2)
Albumin: 4.1 g/dL (ref 3.8–4.8)
Alkaline Phosphatase: 42 IU/L — ABNORMAL LOW (ref 44–121)
BUN/Creatinine Ratio: 14 (ref 10–24)
BUN: 15 mg/dL (ref 8–27)
Bilirubin Total: 0.6 mg/dL (ref 0.0–1.2)
CO2: 22 mmol/L (ref 20–29)
Calcium: 9 mg/dL (ref 8.6–10.2)
Chloride: 102 mmol/L (ref 96–106)
Creatinine, Ser: 1.05 mg/dL (ref 0.76–1.27)
Globulin, Total: 2 g/dL (ref 1.5–4.5)
Glucose: 76 mg/dL (ref 65–99)
Potassium: 4.4 mmol/L (ref 3.5–5.2)
Sodium: 139 mmol/L (ref 134–144)
Total Protein: 6.1 g/dL (ref 6.0–8.5)
eGFR: 78 mL/min/{1.73_m2} (ref >59)

## 2021-04-30 LAB — CBC
Hematocrit: 39.3 % (ref 37.5–51.0)
Hemoglobin: 13.6 g/dL (ref 13.0–17.7)
MCH: 33.9 pg — ABNORMAL HIGH (ref 26.6–33.0)
MCHC: 34.6 g/dL (ref 31.5–35.7)
MCV: 98 fL — ABNORMAL HIGH (ref 79–97)
Platelets: 136 10*3/uL — ABNORMAL LOW (ref 150–450)
RBC: 4.01 x10E6/uL — ABNORMAL LOW (ref 4.14–5.80)
RDW: 12.1 % (ref 11.6–15.4)
WBC: 6.3 10*3/uL (ref 3.4–10.8)

## 2021-05-21 ENCOUNTER — Encounter: Payer: Self-pay | Admitting: Family Medicine

## 2021-07-24 ENCOUNTER — Other Ambulatory Visit: Payer: Self-pay | Admitting: Cardiology

## 2021-08-19 MED ORDER — NITROGLYCERIN 0.4 MG SL SUBL: SUBLINGUAL_TABLET | SUBLINGUAL | 3 refills | Status: DC

## 2021-10-14 ENCOUNTER — Encounter: Payer: Self-pay | Admitting: Family Medicine

## 2021-11-04 DIAGNOSIS — L821 Other seborrheic keratosis: Secondary | ICD-10-CM | POA: Diagnosis not present

## 2021-11-04 DIAGNOSIS — Z789 Other specified health status: Secondary | ICD-10-CM | POA: Diagnosis not present

## 2022-01-19 ENCOUNTER — Encounter: Payer: Self-pay | Admitting: Family Medicine

## 2022-02-22 ENCOUNTER — Encounter: Payer: Self-pay | Admitting: Family Medicine

## 2022-04-06 ENCOUNTER — Other Ambulatory Visit: Payer: Self-pay | Admitting: Cardiology

## 2022-04-08 DIAGNOSIS — H9012 Conductive hearing loss, unilateral, left ear, with unrestricted hearing on the contralateral side: Secondary | ICD-10-CM | POA: Diagnosis not present

## 2022-04-08 DIAGNOSIS — H60332 Swimmer's ear, left ear: Secondary | ICD-10-CM | POA: Diagnosis not present

## 2022-05-04 DIAGNOSIS — H6121 Impacted cerumen, right ear: Secondary | ICD-10-CM | POA: Diagnosis not present

## 2022-05-04 DIAGNOSIS — H60332 Swimmer's ear, left ear: Secondary | ICD-10-CM | POA: Diagnosis not present

## 2022-05-04 DIAGNOSIS — H903 Sensorineural hearing loss, bilateral: Secondary | ICD-10-CM | POA: Diagnosis not present

## 2022-05-28 NOTE — Progress Notes (Signed)
Cardiology Office Note   Date:  05/29/2022   ID:  Bradley [name], DOB [birth year], MRN [id number]  PCP:  Wendie Agreste, MD  Cardiologist:   Minus Breeding, MD     Chief Complaint  Patient presents with   Coronary Artery Disease      History of Present Illness: Bradley [name] is a 66 y.o. male who presents for follow-up of an anterior MI in 2016. He had a coronary calcium score in the past which put him at around the 40th percentile with some mild calcium in the LAD. He had a negative POET (Plain Old Exercise Treadmill). However he presented subsequently with  anterior MI.   He had stenting of proximal LAD. He did have some nonobstructive disease in his RCA as well. His ejection fraction was slightly reduced at 45%.  Follow up echo then showed that the EF was 50 - 55%.    He has been active doing lots of work and at [employer].  He is also started riding his bike again after little hiatus.  He might have to have left hip surgery. The patient denies any new symptoms such as chest discomfort, neck or arm discomfort. There has been no new shortness of breath, PND or orthopnea. There have been no reported palpitations, presyncope or syncope.    Past Medical History:  Diagnosis Date   CAD in native artery    LV dysfunction    EF post MI 35-40%   STEMI (ST elevation myocardial infarction) ([place])    ant wall,  Xience alpine to LAD      Past Surgical History:  Procedure Laterality Date   CARDIAC CATHETERIZATION N/A 08/01/2015   Procedure: Left Heart Cath and Coronary Angiography;  Surgeon: Leonie Man, MD;  Location: [place] CV LAB;  Service: Cardiovascular;  Laterality: N/A;   CARDIAC CATHETERIZATION N/A 08/01/2015   Procedure: Coronary Stent Intervention;  Surgeon: Leonie Man, MD;  Location: [place] CV LAB;  Service: Cardiovascular;  Laterality: N/A;   HERNIA REPAIR     INGUINAL HERNIA REPAIR Right 10/23/2013   Procedure: HERNIA REPAIR INGUINAL ADULT;  Surgeon:  Harl Bowie, MD;  Location: [place];  Service: General;  Laterality: Right;   INGUINAL HERNIA REPAIR Left 12/24/2016   Procedure: LEFT INGUINAL HERNIA REPAIR;  Surgeon: Coralie Keens, MD;  Location: [place];  Service: General;  Laterality: Left;   INSERTION OF MESH Right 10/23/2013   Procedure: INSERTION OF MESH;  Surgeon: Harl Bowie, MD;  Location: [place];  Service: General;  Laterality: Right;   INSERTION OF MESH Left 12/24/2016   Procedure: INSERTION OF MESH;  Surgeon: Coralie Keens, MD;  Location: [place];  Service: General;  Laterality: Left;   JOINT REPLACEMENT Right    hip resurfacing   REPAIR ANKLE LIGAMENT     TONSILLECTOMY       Current Outpatient Medications  Medication Sig Dispense Refill   aspirin EC 81 MG tablet Take 81 mg by mouth daily.     atorvastatin (LIPITOR) 80 MG tablet TAKE 1 TABLET(80 MG) BY MOUTH DAILY AT 6 PM 90 tablet 3   Coenzyme Q10 (CO Q-10) 100 MG CAPS Take 100 mg by mouth 2 (two) times daily.     lisinopril (ZESTRIL) 2.5 MG tablet TAKE 1 TABLET(2.5 MG) BY MOUTH DAILY 90 tablet 3   metoprolol succinate (TOPROL-XL) 25 MG 24 hr tablet TAKE 1/2 TABLET(12.5 MG) BY MOUTH DAILY 45 tablet 3   clopidogrel (  PLAVIX) 75 MG tablet Take 75 mg by mouth daily.     nitroGLYCERIN (NITROSTAT) 0.4 MG SL tablet DISSOLVE 1 TABLET UNDER THE TONGUE UNDER THE TONGUE EVERY 5 MINUTES AS NEEDED FOR CHEST PAIN 25 tablet 3   No current facility-administered medications for this visit.    Allergies:   No known allergies    ROS:  Please see the history of present illness.   Otherwise, review of systems are positive for none.   All other systems are reviewed and negative.    PHYSICAL EXAM: VS:  BP 110/80   Pulse (!) 53   Ht $R'5\' 11"'VR$  (1.803 m)   Wt 180 lb 14.4 oz (82.1 kg)   SpO2 99%   BMI 25.23 kg/m  , BMI Body mass index is 25.23 kg/m.  GENERAL:  Well appearing NECK:  No jugular venous distention, waveform within normal limits, carotid upstroke brisk and symmetric,  no bruits, no thyromegaly LUNGS:  Clear to auscultation bilaterally CHEST:  Unremarkable HEART:  PMI not displaced or sustained,S1 and S2 within normal limits, no S3, no S4, no clicks, no rubs, no murmurs ABD:  Flat, positive bowel sounds normal in frequency in pitch, no bruits, no rebound, no guarding, no midline pulsatile mass, no hepatomegaly, no splenomegaly EXT:  2 plus pulses throughout, no edema, no cyanosis no clubbing   EKG:  EKG is   ordered today. Normal sinus rhythm, rate 53, axis within normal limits, intervals within normal limits, no acute ST-T wave changes.  Recent Labs: No results found for requested labs within last 365 days.    Lipid Panel    Component Value Date/Time   CHOL 101 04/30/2021 0816   TRIG 44 04/30/2021 0816   HDL 36 (L) 04/30/2021 0816   CHOLHDL 2.8 04/30/2021 0816   CHOLHDL 4.0 08/02/2015 1525   VLDL 13 08/02/2015 1525   LDLCALC 54 04/30/2021 0816        Wt Readings from Last 3 Encounters:  05/29/22 180 lb 14.4 oz (82.1 kg)  03/27/21 182 lb (82.6 kg)  12/19/20 185 lb (83.9 kg)      Other studies Reviewed: Additional studies/ records that were reviewed today include: Labs Review of the above records demonstrates:   See elsewhere   ASSESSMENT AND PLAN:  CAD:  The patient has no new sypmtoms.  No further cardiovascular testing is indicated.  We will continue with aggressive risk reduction and meds as listed.Given the nature about his LAD stenting and an acute MI with overlapping stents I have elected to keep him indefinitely on dual antiplatelets since he has had no contraindication  HTN:   His blood pressure is at target.  No change in therapy.   DYSLIPIDEMIA: LDL was 54 in 2022.  HDL was 36.  I will repeat this. I will repeat lipid profile, c-Met and draw CBC.  Of note his LP(a) previously was normal.   DECREASED EF:    This was low normal.   No change in therapy.  PREOP: The patient would be at acceptable risk for the planned hip  surgery working to have this in the next 6 months.  Otherwise we will follow her labetalol.  Current medicines are reviewed at length with the patient today.  The patient does not have concerns regarding medicines.  The following changes have been made:  None    Labs/ tests ordered today include:      Disposition:   FU with me in 12 months.     Signed,  Minus Breeding, MD  05/29/2022 8:30 AM    [place]

## 2022-05-29 ENCOUNTER — Encounter: Payer: Self-pay | Admitting: Cardiology

## 2022-05-29 ENCOUNTER — Ambulatory Visit: Payer: PPO | Admitting: Cardiology

## 2022-05-29 VITALS — BP 110/80 | HR 53 | Ht 71.0 in | Wt 180.9 lb

## 2022-05-29 DIAGNOSIS — I10 Essential (primary) hypertension: Secondary | ICD-10-CM | POA: Diagnosis not present

## 2022-05-29 DIAGNOSIS — I2510 Atherosclerotic heart disease of native coronary artery without angina pectoris: Secondary | ICD-10-CM

## 2022-05-29 DIAGNOSIS — E785 Hyperlipidemia, unspecified: Secondary | ICD-10-CM | POA: Diagnosis not present

## 2022-05-29 MED ORDER — NITROGLYCERIN 0.4 MG SL SUBL: SUBLINGUAL_TABLET | SUBLINGUAL | 3 refills | Status: DC

## 2022-05-29 NOTE — Patient Instructions (Signed)
  Lab Work:  Your physician recommends that you return for lab work FASTING  If you have labs (blood work) drawn today and your tests are completely normal, you will receive your results only by: [place] (if you have [place]) OR A paper copy in the mail If you have any lab test that is abnormal or we need to change your treatment, we will call you to review the results.   Follow-Up: At [facility], you and your health needs are our priority.  As part of our continuing mission to provide you with exceptional heart care, we have created designated Provider Care Teams.  These Care Teams include your primary Cardiologist (physician) and Advanced Practice Providers (APPs -  Physician Assistants and Nurse Practitioners) who all work together to provide you with the care you need, when you need it.  We recommend signing up for the patient portal called "MyChart".  Sign up information is provided on this After Visit Summary.  MyChart is used to connect with patients for Virtual Visits (Telemedicine).  Patients are able to view lab/test results, encounter notes, upcoming appointments, etc.  Non-urgent messages can be sent to your provider as well.   To learn more about what you can do with MyChart, go to [email or web address].    Your next appointment:   12 month(s)  The format for your next appointment:   In Person  Provider:   Minus Breeding MD      Important Information About Sugar

## 2022-05-29 NOTE — Telephone Encounter (Signed)
Copied from chart:  Encounter Date:  12/07/2016   Signed                      The patient should already be off of Plavix and should be on ASA only.  This needs to continue.  Based on ACC/AHA guidelines, the patient would be at acceptable risk for the planned procedure without further cardiovascular testing.        Electronically signed by Minus Breeding, MD at 12/07/2016  1:35 PM

## 2022-05-30 LAB — LIPID PANEL
Chol/HDL Ratio: 2.6 ratio (ref 0.0–5.0)
Cholesterol, Total: 104 mg/dL (ref 100–199)
HDL: 40 mg/dL (ref >39)
LDL Chol Calc (NIH): 52 mg/dL (ref 0–99)
Triglycerides: 46 mg/dL (ref 0–149)
VLDL Cholesterol Cal: 12 mg/dL (ref 5–40)

## 2022-05-30 LAB — COMPREHENSIVE METABOLIC PANEL
ALT: 25 IU/L (ref 0–44)
AST: 30 IU/L (ref 0–40)
Albumin/Globulin Ratio: 2.4 — ABNORMAL HIGH (ref 1.2–2.2)
Albumin: 4.4 g/dL (ref 3.8–4.8)
Alkaline Phosphatase: 46 IU/L (ref 44–121)
BUN/Creatinine Ratio: 22 (ref 10–24)
BUN: 21 mg/dL (ref 8–27)
Bilirubin Total: 0.5 mg/dL (ref 0.0–1.2)
CO2: 25 mmol/L (ref 20–29)
Calcium: 9.3 mg/dL (ref 8.6–10.2)
Chloride: 103 mmol/L (ref 96–106)
Creatinine, Ser: 0.96 mg/dL (ref 0.76–1.27)
Globulin, Total: 1.8 g/dL (ref 1.5–4.5)
Glucose: 83 mg/dL (ref 70–99)
Potassium: 4.4 mmol/L (ref 3.5–5.2)
Sodium: 141 mmol/L (ref 134–144)
Total Protein: 6.2 g/dL (ref 6.0–8.5)
eGFR: 87 mL/min/{1.73_m2} (ref >59)

## 2022-05-30 LAB — CBC
Hematocrit: 42.3 % (ref 37.5–51.0)
Hemoglobin: 14.5 g/dL (ref 13.0–17.7)
MCH: 33.1 pg — ABNORMAL HIGH (ref 26.6–33.0)
MCHC: 34.3 g/dL (ref 31.5–35.7)
MCV: 97 fL (ref 79–97)
Platelets: 182 10*3/uL (ref 150–450)
RBC: 4.38 x10E6/uL (ref 4.14–5.80)
RDW: 12.1 % (ref 11.6–15.4)
WBC: 5.7 10*3/uL (ref 3.4–10.8)

## 2022-06-08 ENCOUNTER — Encounter: Payer: Self-pay | Admitting: Family Medicine

## 2022-06-08 ENCOUNTER — Ambulatory Visit (INDEPENDENT_AMBULATORY_CARE_PROVIDER_SITE_OTHER): Payer: PPO | Admitting: Family Medicine

## 2022-06-08 VITALS — BP 90/60 | HR 55 | Temp 98.8°F | Ht 71.0 in | Wt 183.2 lb

## 2022-06-08 DIAGNOSIS — M25552 Pain in left hip: Secondary | ICD-10-CM | POA: Diagnosis not present

## 2022-06-08 NOTE — Patient Instructions (Addendum)
[facility] for xray Walk in 8:30-4:30 during weekdays, no appointment needed [place].  [place],  [id number]  As we discussed there may be a combination of causes of the hip pain.  X-ray should show any significant arthritis.  Outside hip pain may be more of a trochanteric bursitis, see information below.  IT band syndrome is less likely, but can try the stretches we discussed as well IF those are not painful.  Let me know how you are doing in the next 2 weeks and that can decide next step unless there are concerns on x-ray.  Hip Pain The hip is the joint between the upper legs and the lower pelvis. The bones, cartilage, tendons, and muscles of your hip joint support your body and allow you to move around. Hip pain can range from a minor ache to severe pain in one or both of your hips. The pain may be felt on the inside of the hip joint near the groin, or on the outside near the buttocks and upper thigh. You may also have swelling or stiffness in your hip area. Follow these instructions at home: Managing pain, stiffness, and swelling     If directed, put ice on the painful area. To do this: Put ice in a plastic bag. Place a towel between your skin and the bag. Leave the ice on for 20 minutes, 2-3 times a day. If directed, apply heat to the affected area as often as told by your health care provider. Use the heat source that your health care provider recommends, such as a moist heat pack or a heating pad. Place a towel between your skin and the heat source. Leave the heat on for 20-30 minutes. Remove the heat if your skin turns bright red. This is especially important if you are unable to feel pain, heat, or cold. You may have a greater risk of getting burned. Activity Do exercises as told by your health care provider. Avoid activities that cause pain. General instructions  Take over-the-counter and prescription medicines only as told by your health care provider. Keep a journal  of your symptoms. Write down: How often you have hip pain. The location of your pain. What the pain feels like. What makes the pain worse. Sleep with a pillow between your legs on your most comfortable side. Keep all follow-up visits as told by your health care provider. This is important. Contact a health care provider if: You cannot put weight on your leg. Your pain or swelling continues or gets worse after one week. It gets harder to walk. You have a fever. Get help right away if: You fall. You have a sudden increase in pain and swelling in your hip. Your hip is red or swollen or very tender to touch. Summary Hip pain can range from a minor ache to severe pain in one or both of your hips. The pain may be felt on the inside of the hip joint near the groin, or on the outside near the buttocks and upper thigh. Avoid activities that cause pain. Write down how often you have hip pain, the location of the pain, what makes it worse, and what it feels like. This information is not intended to replace advice given to you by your health care provider. Make sure you discuss any questions you have with your health care provider. Document Revised: 03/27/2019 Document Reviewed: 03/27/2019 Elsevier Patient Education  [place].   Hip Bursitis  Hip bursitis is the  swelling of one or more of the fluid-filled sacs (bursae) in the hip joint. The hip bursae absorb shocks and prevent bones from rubbing against each other. If a bursa becomes irritated, it can fill with extra fluid and become inflamed. Hip bursitis can cause mild to moderate pain, and symptoms often come and go over time. What are the causes? This condition results from increased friction between the hip bones and the tendons around the hip joint. This condition can happen if you: Overuse your hip muscles. Injure your hip. Have weak buttocks muscles. Have bone spurs. Have an infection. In some cases, the cause may not be  known. What increases the risk? You are more likely to develop this condition if: You injured your hip previously or had hip surgery. You have a medical condition, such as arthritis, gout, diabetes, or thyroid disease. You have spine problems. You have one leg that is shorter than the other. You participate in athletic activities that include repetitive motion, like running. You participate in sports where there is a risk of injury or falling, such as football, martial arts, or skiing. What are the signs or symptoms? Symptoms may come and go, and they often include: Pain in the hip or groin area. Pain may get worse with movement. Tenderness and swelling of the hip. In rare cases, the bursa may become infected. If this happens, you may get a fever, as well as warmth and redness in the hip area. How is this diagnosed? This condition may be diagnosed based on: Your symptoms. Your medical history. A physical exam. Imaging tests, such as: X-rays to check your bones. MRI or ultrasound to check your tendons and muscles. Bone scan. How is this treated? This condition is treated by resting, icing, applying pressure (compression), and raising (elevating) the injured area. This is called RICE treatment. In some cases, RICE treatment may not be enough to make your symptoms go away. Treatment may also include: Using crutches, a cane, or a walker to decrease the strain on your hip. Taking medicine to help with swelling and pain. Getting a shot of cortisone medicine near the affected area to reduce swelling and pain. Taking antibiotic medicines if there is an infection. Draining fluid out of the bursa to help relieve swelling and pain. Having surgery to remove a damaged or infected bursa. This is rare. Long-term treatment may include: Physical therapy exercises for strength and flexibility. Identifying the cause of your bursitis to prevent future episodes. Lifestyle changes, such as weight loss,  to reduce the strain on the hip. Follow these instructions at home: Managing pain, stiffness, and swelling     If directed, put ice on the affected area. To do this: Put ice in a plastic bag. Place a towel between your skin and the bag. Leave the ice on for 20 minutes, 2-3 times a day. Remove the ice if your skin turns bright red. This is very important. If you cannot feel pain, heat, or cold, you have a greater risk of damage to the area. Elevate your hip as much as you can without feeling pain. To do this, put a pillow under your hips while you lie down. If directed, apply heat to the affected area as often as told by your health care provider. Use the heat source that your health care provider recommends, such as a moist heat pack or a heating pad. Place a towel between your skin and the heat source. Leave the heat on for 20-30 minutes. Remove the  heat if your skin turns bright red. This is especially important if you are unable to feel pain, heat, or cold. You may have a greater risk of getting burned. Activity Do not use your hip to support your body weight until your health care provider says that you can. Use crutches, a cane, or a walker as told by your health care provider. If the affected leg is one that you use to drive, ask your health care provider if it is safe to drive. Rest and protect your hip as much as possible until your pain and swelling get better. Return to your normal activities as told by your health care provider. Ask your health care provider what activities are safe for you. Do exercises as told by your health care provider. General instructions Take over-the-counter and prescription medicines only as told by your health care provider. Gently massage and stretch your injured area as often as is comfortable. Wear compression wraps only as told by your health care provider. If one of your legs is shorter than the other, get fitted for a shoe insert or orthotic.  Your health care provider or physical therapist can tell you where to find these items and what size you need. Maintain a healthy weight. Follow instructions from your health care provider for weight control. These may include dietary restrictions. Keep all follow-up visits. This is important. How is this prevented? Exercise regularly or as told by your health care provider. Wear supportive footwear that is appropriate for your sport and daily activities. Warm up and stretch before being active. Cool down and stretch after being active. Take breaks regularly from repetitive activity. If an activity irritates your hip or causes pain, avoid the activity as much as possible. Avoid sitting down for long periods at a time. Where to find more information [employer]: orthoinfo.aaos.org Contact a health care provider if: You have a fever. You develop new symptoms. You have trouble walking or doing everyday activities. You have pain that gets worse or does not get better with medicine. You develop red skin or a feeling of warmth in your hip area. Get help right away if: You cannot move your hip. You have severe pain. You cannot control the muscles in your feet. Summary Hip bursitis is the swelling of one or more of the fluid-filled sacs (bursae) in the hip joint. Hip bursitis can cause hip or groin pain, and symptoms often come and go over time. This condition is often treated by resting, icing, applying pressure (compression), and raising (elevating) the injured area. Other treatments may be needed. This information is not intended to replace advice given to you by your health care provider. Make sure you discuss any questions you have with your health care provider. Document Revised: 11/04/2021 Document Reviewed: 11/04/2021 Elsevier Patient Education  [place].

## 2022-06-08 NOTE — Progress Notes (Signed)
Subjective:  Patient ID: Nonnie Done, male    DOB: 1956/10/27  Age: 66 y.o. MRN: [id number]  CC:  Chief Complaint  Patient presents with   Acute Visit    Left hip pain    HPI Sire Poet Kindley presents for   L hip pain: Off and on past 6 months.  No preceding injury.  Pain with twist or crossing legs.  Sore to tie shoes at times.  No back/buttock or radicular pain.  R hip overall doing ok. R hip resurfacing about 18 yrs ago in [place]. R hip is fine.  Bicycling helps, road bike.  Tx: alleve 2 per day past few weeks, combined with voltaren gel 2 times per day.unknown effectiveness.  Hx of CAD, s/p MI and PTCA of LAD.   History Patient Active Problem List   Diagnosis Date Noted   Coronary artery disease involving native coronary artery of native heart without angina pectoris 11/01/2019   Essential hypertension 11/01/2019   Educated about COVID-19 virus infection 11/01/2019   CAD S/P LAD DES 08/14/2015   Dyslipidemia 08/14/2015   Acute ST elevation myocardial infarction (STEMI) involving left anterior descending coronary artery ([place]) 08/02/2015   Cardiomyopathy, ischemic:  08/02/2015   Family history of early CAD 05/30/2015   Right inguinal hernia 10/09/2013   ARTHRITIS, RIGHT HIP 04/29/2009   Pain in limb 08/30/2007   UNEQUAL LEG LENGTH, ACQUIRED 08/30/2007   Past Medical History:  Diagnosis Date   CAD in native artery    LV dysfunction    EF post MI 35-40%   STEMI (ST elevation myocardial infarction) ([place])    ant wall,  Xience alpine to LAD     Past Surgical History:  Procedure Laterality Date   CARDIAC CATHETERIZATION N/A 08/01/2015   Procedure: Left Heart Cath and Coronary Angiography;  Surgeon: Leonie Man, MD;  Location: [facility];  Service: Cardiovascular;  Laterality: N/A;   CARDIAC CATHETERIZATION N/A 08/01/2015   Procedure: Coronary Stent Intervention;  Surgeon: Leonie Man, MD;  Location: [facility];  Service: Cardiovascular;   Laterality: N/A;   HERNIA REPAIR     INGUINAL HERNIA REPAIR Right 10/23/2013   Procedure: HERNIA REPAIR INGUINAL ADULT;  Surgeon: Harl Bowie, MD;  Location: [facility];  Service: General;  Laterality: Right;   INGUINAL HERNIA REPAIR Left 12/24/2016   Procedure: LEFT INGUINAL HERNIA REPAIR;  Surgeon: Coralie Keens, MD;  Location: [facility];  Service: General;  Laterality: Left;   INSERTION OF MESH Right 10/23/2013   Procedure: INSERTION OF MESH;  Surgeon: Harl Bowie, MD;  Location: [facility];  Service: General;  Laterality: Right;   INSERTION OF MESH Left 12/24/2016   Procedure: INSERTION OF MESH;  Surgeon: Coralie Keens, MD;  Location: [facility];  Service: General;  Laterality: Left;   JOINT REPLACEMENT Right    hip resurfacing   REPAIR ANKLE LIGAMENT     TONSILLECTOMY     Allergies  Allergen Reactions   No Known Allergies    Prior to Admission medications   Medication Sig Start Date End Date Taking? Authorizing Provider  aspirin EC 81 MG tablet Take 81 mg by mouth daily.   Yes [provider]  atorvastatin (LIPITOR) 80 MG tablet TAKE 1 TABLET(80 MG) BY MOUTH DAILY AT 6 PM 07/25/21  Yes Minus Breeding, MD  Coenzyme Q10 (CO Q-10) 100 MG CAPS Take 100 mg by mouth 2 (two) times daily.   Yes [provider]  lisinopril (ZESTRIL)  2.5 MG tablet TAKE 1 TABLET(2.5 MG) BY MOUTH DAILY 07/25/21  Yes Minus Breeding, MD  metoprolol succinate (TOPROL-XL) 25 MG 24 hr tablet TAKE 1/2 TABLET(12.5 MG) BY MOUTH DAILY 04/06/22  Yes Hochrein, Jeneen Rinks, MD  nitroGLYCERIN (NITROSTAT) 0.4 MG SL tablet DISSOLVE 1 TABLET UNDER THE TONGUE UNDER THE TONGUE EVERY 5 MINUTES AS NEEDED FOR CHEST PAIN 05/29/22  Yes Minus Breeding, MD   Social History   Socioeconomic History   Marital status: Married    Spouse name: Not on file   Number of children: 4   Years of education: Not on file   Highest education level: Not on file  Occupational History   Occupation: Facilities manager  Tobacco Use   Smoking  status: Never   Smokeless tobacco: Never  Vaping Use   Vaping Use: Never used  Substance and Sexual Activity   Alcohol use: Yes    Alcohol/week: 2.0 - 3.0 standard drinks of alcohol    Types: 2 - 3 Cans of beer per week    Comment: occasion   Drug use: No   Sexual activity: Not on file  Other Topics Concern   Not on file  Social History Narrative   Lives at home with wife.    Social Determinants of Health   Financial Resource Strain: Not on file  Food Insecurity: Not on file  Transportation Needs: Not on file  Physical Activity: Not on file  Stress: Not on file  Social Connections: Not on file  Intimate Partner Violence: Not on file    Review of Systems Per HPI   Objective:   Vitals:   06/08/22 1520  BP: 90/60  Pulse: (!) 55  Temp: 98.8 F (37.1 C)  TempSrc: Oral  SpO2: 97%  Weight: 183 lb 3.2 oz (83.1 kg)  Height: '5\' 11"'$  (1.803 m)     Physical Exam Vitals reviewed.  Constitutional:      General: He is not in acute distress.    Appearance: Normal appearance. He is well-developed.  HENT:     Head: Normocephalic and atraumatic.  Cardiovascular:     Rate and Rhythm: Normal rate.  Pulmonary:     Effort: Pulmonary effort is normal.  Musculoskeletal:     Comments: Right hip, pain-free range of motion Left hip, negative seated straight leg raise.  Lumbar spine nontender, SI joint nontender. Discomfort primarily lateral hip at the lesser trochanter with full hip flexion.  Negative FABER, lateral discomfort with FADIR.  No focal tenderness over trochanteric bursa, but notes this is the area where the discomfort occurs.  Neurological:     Mental Status: He is alert and oriented to person, place, and time.  Psychiatric:        Mood and Affect: Mood normal.        Behavior: Behavior normal.        Assessment & Plan:  ALBARO DEVINEY is a 66 y.o. male . Left hip pain - Plan: DG HIP UNILAT W OR W/O PELVIS 2-3 VIEWS LEFT Primarily appears to be in area of  trochanteric bursa but minimal discomfort on exam.  Minimal medial discomfort.  check imaging.  Handout given on trochanteric bursitis, option of injection.  IT band stretches discussed but less likely IT band syndrome.  RTC precautions discussed.  No orders of the defined types were placed in this encounter.  Patient Instructions  [facility] for xray Walk in 8:30-4:30 during weekdays, no appointment needed [place].  [place], [place] [id number]  As we discussed there may be a combination of causes of the hip pain.  X-ray should show any significant arthritis.  Outside hip pain may be more of a trochanteric bursitis, see information below.  IT band syndrome is less likely, but can try the stretches we discussed as well IF those are not painful.  Let me know how you are doing in the next 2 weeks and that can decide next step unless there are concerns on x-ray.  Hip Pain The hip is the joint between the upper legs and the lower pelvis. The bones, cartilage, tendons, and muscles of your hip joint support your body and allow you to move around. Hip pain can range from a minor ache to severe pain in one or both of your hips. The pain may be felt on the inside of the hip joint near the groin, or on the outside near the buttocks and upper thigh. You may also have swelling or stiffness in your hip area. Follow these instructions at home: Managing pain, stiffness, and swelling     If directed, put ice on the painful area. To do this: Put ice in a plastic bag. Place a towel between your skin and the bag. Leave the ice on for 20 minutes, 2-3 times a day. If directed, apply heat to the affected area as often as told by your health care provider. Use the heat source that your health care provider recommends, such as a moist heat pack or a heating pad. Place a towel between your skin and the heat source. Leave the heat on for 20-30 minutes. Remove the heat if your skin turns bright red. This is  especially important if you are unable to feel pain, heat, or cold. You may have a greater risk of getting burned. Activity Do exercises as told by your health care provider. Avoid activities that cause pain. General instructions  Take over-the-counter and prescription medicines only as told by your health care provider. Keep a journal of your symptoms. Write down: How often you have hip pain. The location of your pain. What the pain feels like. What makes the pain worse. Sleep with a pillow between your legs on your most comfortable side. Keep all follow-up visits as told by your health care provider. This is important. Contact a health care provider if: You cannot put weight on your leg. Your pain or swelling continues or gets worse after one week. It gets harder to walk. You have a fever. Get help right away if: You fall. You have a sudden increase in pain and swelling in your hip. Your hip is red or swollen or very tender to touch. Summary Hip pain can range from a minor ache to severe pain in one or both of your hips. The pain may be felt on the inside of the hip joint near the groin, or on the outside near the buttocks and upper thigh. Avoid activities that cause pain. Write down how often you have hip pain, the location of the pain, what makes it worse, and what it feels like. This information is not intended to replace advice given to you by your health care provider. Make sure you discuss any questions you have with your health care provider. Document Revised: 03/27/2019 Document Reviewed: 03/27/2019 Elsevier Patient Education  [place].   Hip Bursitis  Hip bursitis is the swelling of one or more of the fluid-filled sacs (bursae) in the hip joint. The hip bursae absorb shocks and prevent  bones from rubbing against each other. If a bursa becomes irritated, it can fill with extra fluid and become inflamed. Hip bursitis can cause mild to moderate pain, and symptoms  often come and go over time. What are the causes? This condition results from increased friction between the hip bones and the tendons around the hip joint. This condition can happen if you: Overuse your hip muscles. Injure your hip. Have weak buttocks muscles. Have bone spurs. Have an infection. In some cases, the cause may not be known. What increases the risk? You are more likely to develop this condition if: You injured your hip previously or had hip surgery. You have a medical condition, such as arthritis, gout, diabetes, or thyroid disease. You have spine problems. You have one leg that is shorter than the other. You participate in athletic activities that include repetitive motion, like running. You participate in sports where there is a risk of injury or falling, such as football, martial arts, or skiing. What are the signs or symptoms? Symptoms may come and go, and they often include: Pain in the hip or groin area. Pain may get worse with movement. Tenderness and swelling of the hip. In rare cases, the bursa may become infected. If this happens, you may get a fever, as well as warmth and redness in the hip area. How is this diagnosed? This condition may be diagnosed based on: Your symptoms. Your medical history. A physical exam. Imaging tests, such as: X-rays to check your bones. MRI or ultrasound to check your tendons and muscles. Bone scan. How is this treated? This condition is treated by resting, icing, applying pressure (compression), and raising (elevating) the injured area. This is called RICE treatment. In some cases, RICE treatment may not be enough to make your symptoms go away. Treatment may also include: Using crutches, a cane, or a walker to decrease the strain on your hip. Taking medicine to help with swelling and pain. Getting a shot of cortisone medicine near the affected area to reduce swelling and pain. Taking antibiotic medicines if there is an  infection. Draining fluid out of the bursa to help relieve swelling and pain. Having surgery to remove a damaged or infected bursa. This is rare. Long-term treatment may include: Physical therapy exercises for strength and flexibility. Identifying the cause of your bursitis to prevent future episodes. Lifestyle changes, such as weight loss, to reduce the strain on the hip. Follow these instructions at home: Managing pain, stiffness, and swelling     If directed, put ice on the affected area. To do this: Put ice in a plastic bag. Place a towel between your skin and the bag. Leave the ice on for 20 minutes, 2-3 times a day. Remove the ice if your skin turns bright red. This is very important. If you cannot feel pain, heat, or cold, you have a greater risk of damage to the area. Elevate your hip as much as you can without feeling pain. To do this, put a pillow under your hips while you lie down. If directed, apply heat to the affected area as often as told by your health care provider. Use the heat source that your health care provider recommends, such as a moist heat pack or a heating pad. Place a towel between your skin and the heat source. Leave the heat on for 20-30 minutes. Remove the heat if your skin turns bright red. This is especially important if you are unable to feel pain, heat, or cold.  You may have a greater risk of getting burned. Activity Do not use your hip to support your body weight until your health care provider says that you can. Use crutches, a cane, or a walker as told by your health care provider. If the affected leg is one that you use to drive, ask your health care provider if it is safe to drive. Rest and protect your hip as much as possible until your pain and swelling get better. Return to your normal activities as told by your health care provider. Ask your health care provider what activities are safe for you. Do exercises as told by your health care  provider. General instructions Take over-the-counter and prescription medicines only as told by your health care provider. Gently massage and stretch your injured area as often as is comfortable. Wear compression wraps only as told by your health care provider. If one of your legs is shorter than the other, get fitted for a shoe insert or orthotic. Your health care provider or physical therapist can tell you where to find these items and what size you need. Maintain a healthy weight. Follow instructions from your health care provider for weight control. These may include dietary restrictions. Keep all follow-up visits. This is important. How is this prevented? Exercise regularly or as told by your health care provider. Wear supportive footwear that is appropriate for your sport and daily activities. Warm up and stretch before being active. Cool down and stretch after being active. Take breaks regularly from repetitive activity. If an activity irritates your hip or causes pain, avoid the activity as much as possible. Avoid sitting down for long periods at a time. Where to find more information American Academy of Orthopaedic Surgeons: orthoinfo.aaos.org Contact a health care provider if: You have a fever. You develop new symptoms. You have trouble walking or doing everyday activities. You have pain that gets worse or does not get better with medicine. You develop red skin or a feeling of warmth in your hip area. Get help right away if: You cannot move your hip. You have severe pain. You cannot control the muscles in your feet. Summary Hip bursitis is the swelling of one or more of the fluid-filled sacs (bursae) in the hip joint. Hip bursitis can cause hip or groin pain, and symptoms often come and go over time. This condition is often treated by resting, icing, applying pressure (compression), and raising (elevating) the injured area. Other treatments may be needed. This information is  not intended to replace advice given to you by your health care provider. Make sure you discuss any questions you have with your health care provider. Document Revised: 11/04/2021 Document Reviewed: 11/04/2021 Elsevier Patient Education  2023 [place],   Merri Ray, MD [place], [facility] 06/08/22 3:57 PM

## 2022-06-09 ENCOUNTER — Ambulatory Visit (INDEPENDENT_AMBULATORY_CARE_PROVIDER_SITE_OTHER)
Admission: RE | Admit: 2022-06-09 | Discharge: 2022-06-09 | Disposition: A | Discharge status: Home / Self Care | Payer: PPO | Source: Ambulatory Visit | Attending: Family Medicine | Admitting: Family Medicine

## 2022-06-09 DIAGNOSIS — M25552 Pain in left hip: Secondary | ICD-10-CM | POA: Diagnosis not present

## 2022-06-17 ENCOUNTER — Ambulatory Visit (INDEPENDENT_AMBULATORY_CARE_PROVIDER_SITE_OTHER): Payer: PPO

## 2022-06-17 DIAGNOSIS — Z0000 Encounter for general adult medical examination without abnormal findings: Secondary | ICD-10-CM

## 2022-06-17 NOTE — Patient Instructions (Signed)
Bradley [name] , Thank you for taking time to come for your Medicare Wellness Visit. I appreciate your ongoing commitment to your health goals. Please review the following plan we discussed and let me know if I can assist you in the future.   Screening recommendations/referrals: Colonoscopy: Cologuard 12/23/2020 Recommended yearly ophthalmology/optometry visit for glaucoma screening and checkup Recommended yearly dental visit for hygiene and checkup  Vaccinations: Influenza vaccine: completed  Pneumococcal vaccine: completed  Tdap vaccine: 04/15/2015 Shingles vaccine: completed     Advanced directives: none   Conditions/risks identified: none   Next appointment: none   Preventive Care 29 Years and Older, Male Preventive care refers to lifestyle choices and visits with your health care provider that can promote health and wellness. What does preventive care include? A yearly physical exam. This is also called an annual well check. Dental exams once or twice a year. Routine eye exams. Ask your health care provider how often you should have your eyes checked. Personal lifestyle choices, including: Daily care of your teeth and gums. Regular physical activity. Eating a healthy diet. Avoiding tobacco and drug use. Limiting alcohol use. Practicing safe sex. Taking low doses of aspirin every day. Taking vitamin and mineral supplements as recommended by your health care provider. What happens during an annual well check? The services and screenings done by your health care provider during your annual well check will depend on your age, overall health, lifestyle risk factors, and family history of disease. Counseling  Your health care provider may ask you questions about your: Alcohol use. Tobacco use. Drug use. Emotional well-being. Home and relationship well-being. Sexual activity. Eating habits. History of falls. Memory and ability to understand (cognition). Work and work  Statistician. Screening  You may have the following tests or measurements: Height, weight, and BMI. Blood pressure. Lipid and cholesterol levels. These may be checked every 5 years, or more frequently if you are over 48 years old. Skin check. Lung cancer screening. You may have this screening every year starting at age 66 if you have a 30-pack-year history of smoking and currently smoke or have quit within the past 15 years. Fecal occult blood test (FOBT) of the stool. You may have this test every year starting at age 23. Flexible sigmoidoscopy or colonoscopy. You may have a sigmoidoscopy every 5 years or a colonoscopy every 10 years starting at age 46. Prostate cancer screening. Recommendations will vary depending on your family history and other risks. Hepatitis C blood test. Hepatitis B blood test. Sexually transmitted disease (STD) testing. Diabetes screening. This is done by checking your blood sugar (glucose) after you have not eaten for a while (fasting). You may have this done every 1-3 years. Abdominal aortic aneurysm (AAA) screening. You may need this if you are a current or former smoker. Osteoporosis. You may be screened starting at age 73 if you are at high risk. Talk with your health care provider about your test results, treatment options, and if necessary, the need for more tests. Vaccines  Your health care provider may recommend certain vaccines, such as: Influenza vaccine. This is recommended every year. Tetanus, diphtheria, and acellular pertussis (Tdap, Td) vaccine. You may need a Td booster every 10 years. Zoster vaccine. You may need this after age 7. Pneumococcal 13-valent conjugate (PCV13) vaccine. One dose is recommended after age 28. Pneumococcal polysaccharide (PPSV23) vaccine. One dose is recommended after age 45. Talk to your health care provider about which screenings and vaccines you need and how often you  need them. This information is not intended to replace  advice given to you by your health care provider. Make sure you discuss any questions you have with your health care provider. Document Released: 12/06/2015 Document Revised: 07/29/2016 Document Reviewed: 09/10/2015 Elsevier Interactive Patient Education  2017 [place] Prevention in the Home Falls can cause injuries. They can happen to people of all ages. There are many things you can do to make your home safe and to help prevent falls. What can I do on the outside of my home? Regularly fix the edges of walkways and driveways and fix any cracks. Remove anything that might make you trip as you walk through a door, such as a raised step or threshold. Trim any bushes or trees on the path to your home. Use bright outdoor lighting. Clear any walking paths of anything that might make someone trip, such as rocks or tools. Regularly check to see if handrails are loose or broken. Make sure that both sides of any steps have handrails. Any raised decks and porches should have guardrails on the edges. Have any leaves, snow, or ice cleared regularly. Use sand or salt on walking paths during winter. Clean up any spills in your garage right away. This includes oil or grease spills. What can I do in the bathroom? Use night lights. Install grab bars by the toilet and in the tub and shower. Do not use towel bars as grab bars. Use non-skid mats or decals in the tub or shower. If you need to sit down in the shower, use a plastic, non-slip stool. Keep the floor dry. Clean up any water that spills on the floor as soon as it happens. Remove soap buildup in the tub or shower regularly. Attach bath mats securely with double-sided non-slip rug tape. Do not have throw rugs and other things on the floor that can make you trip. What can I do in the bedroom? Use night lights. Make sure that you have a light by your bed that is easy to reach. Do not use any sheets or blankets that are too big for your bed.  They should not hang down onto the floor. Have a firm chair that has side arms. You can use this for support while you get dressed. Do not have throw rugs and other things on the floor that can make you trip. What can I do in the kitchen? Clean up any spills right away. Avoid walking on wet floors. Keep items that you use a lot in easy-to-reach places. If you need to reach something above you, use a strong step stool that has a grab bar. Keep electrical cords out of the way. Do not use floor polish or wax that makes floors slippery. If you must use wax, use non-skid floor wax. Do not have throw rugs and other things on the floor that can make you trip. What can I do with my stairs? Do not leave any items on the stairs. Make sure that there are handrails on both sides of the stairs and use them. Fix handrails that are broken or loose. Make sure that handrails are as long as the stairways. Check any carpeting to make sure that it is firmly attached to the stairs. Fix any carpet that is loose or worn. Avoid having throw rugs at the top or bottom of the stairs. If you do have throw rugs, attach them to the floor with carpet tape. Make sure that you have a light switch  at the top of the stairs and the bottom of the stairs. If you do not have them, ask someone to add them for you. What else can I do to help prevent falls? Wear shoes that: Do not have high heels. Have rubber bottoms. Are comfortable and fit you well. Are closed at the toe. Do not wear sandals. If you use a stepladder: Make sure that it is fully opened. Do not climb a closed stepladder. Make sure that both sides of the stepladder are locked into place. Ask someone to hold it for you, if possible. Clearly mark and make sure that you can see: Any grab bars or handrails. First and last steps. Where the edge of each step is. Use tools that help you move around (mobility aids) if they are needed. These  include: Canes. Walkers. Scooters. Crutches. Turn on the lights when you go into a dark area. Replace any light bulbs as soon as they burn out. Set up your furniture so you have a clear path. Avoid moving your furniture around. If any of your floors are uneven, fix them. If there are any pets around you, be aware of where they are. Review your medicines with your doctor. Some medicines can make you feel dizzy. This can increase your chance of falling. Ask your doctor what other things that you can do to help prevent falls. This information is not intended to replace advice given to you by your health care provider. Make sure you discuss any questions you have with your health care provider. Document Released: 09/05/2009 Document Revised: 04/16/2016 Document Reviewed: 12/14/2014 Elsevier Interactive Patient Education  2017 [employer].

## 2022-06-17 NOTE — Progress Notes (Signed)
Subjective:   Bradley [name] is a 66 y.o. male who presents for an Subsequent  Medicare Annual Wellness Visit.   I connected with Bradley [name]  today by telephone and verified that I am speaking with the correct person using two identifiers. Location patient: home Location provider: work Persons participating in the virtual visit: patient, provider.   I discussed the limitations, risks, security and privacy concerns of performing an evaluation and management service by telephone and the availability of in person appointments. I also discussed with the patient that there may be a patient responsible charge related to this service. The patient expressed understanding and verbally consented to this telephonic visit.    Interactive audio and video telecommunications were attempted between this provider and patient, however failed, due to patient having technical difficulties OR patient did not have access to video capability.  We continued and completed visit with audio only.    Review of Systems     Cardiac Risk Factors include: advanced age (>12mn, >>70women);male gender     Objective:    Today's Vitals   There is no height or weight on file to calculate BMI.     06/17/2022    3:21 PM 12/22/2016    8:30 AM 12/22/2016    8:22 AM 08/01/2015   10:10 PM 10/16/2013    1:26 PM  Advanced Directives  Does Patient Have a Medical Advance Directive? Yes Yes Yes No Patient has advance directive, copy not in chart  Type of Advance [place]ive [place] Living will;Healthcare Power of Attorney    Does patient want to make changes to medical advance directive?  No - Patient declined No - Patient declined  No  Copy of Healthcare Power of Attorney in Chart? No - copy requested No - copy requested No - copy requested      Current Medications (verified) Outpatient Encounter Medications as of 06/17/2022  Medication Sig   aspirin  EC 81 MG tablet Take 81 mg by mouth daily.   atorvastatin (LIPITOR) 80 MG tablet TAKE 1 TABLET(80 MG) BY MOUTH DAILY AT 6 PM   Coenzyme Q10 (CO Q-10) 100 MG CAPS Take 100 mg by mouth 2 (two) times daily.   lisinopril (ZESTRIL) 2.5 MG tablet TAKE 1 TABLET(2.5 MG) BY MOUTH DAILY   metoprolol succinate (TOPROL-XL) 25 MG 24 hr tablet TAKE 1/2 TABLET(12.5 MG) BY MOUTH DAILY   nitroGLYCERIN (NITROSTAT) 0.4 MG SL tablet DISSOLVE 1 TABLET UNDER THE TONGUE UNDER THE TONGUE EVERY 5 MINUTES AS NEEDED FOR CHEST PAIN   No facility-administered encounter medications on file as of 06/17/2022.    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%   [place],  X[place] to LAD     Past Surgical History:  Procedure Laterality Date   CARDIAC CATHETERIZATION N/A 08/01/2015   Procedure: Left Heart Cath and Coronary Angiography;  Surgeon: DLeonie Man MD;  [facility];  Service: Cardiovascular;  Laterality: N/A;   CARDIAC CATHETERIZATION N/A 08/01/2015   Procedure: Coronary Stent Intervention;  Surgeon: DLeonie Man MD;  [facility];  Service: Cardiovascular;  Laterality: N/A;   HERNIA REPAIR     INGUINAL HERNIA REPAIR Right 10/23/2013   Procedure: HERNIA REPAIR INGUINAL  ADULT;  Surgeon: Harl Bowie, MD;  Location: [facility] OR;  Service: General;  Laterality: Right;   INGUINAL HERNIA REPAIR Left 12/24/2016   Procedure: LEFT INGUINAL HERNIA REPAIR;  Surgeon: Coralie Keens, MD;  Location: [place] OR;  Service: General;  Laterality: Left;   INSERTION OF MESH Right 10/23/2013   Procedure: INSERTION OF MESH;  Surgeon: Harl Bowie, MD;  Location: [place];  Service: General;  Laterality: Right;   INSERTION OF MESH Left 12/24/2016   Procedure: INSERTION OF MESH;  Surgeon: Coralie Keens, MD;  Location: [facility] OR;  Service:  General;  Laterality: Left;   JOINT REPLACEMENT Right    hip resurfacing   REPAIR ANKLE LIGAMENT     TONSILLECTOMY     Family History  Problem Relation Age of Onset   Stroke Mother 42   Cancer Mother        breast   CAD Mother 25   CAD Father 46       CABG   CAD Sister 29   Cancer Brother        testicular cancer   Hyperlipidemia Brother    Hyperlipidemia Brother    Social History   Socioeconomic History   Marital status: Married    Spouse name: Not on file   Number of children: 4   Years of education: Not on file   Highest education level: Not on file  Occupational History   Occupation: Facilities manager  Tobacco Use   Smoking status: Never   Smokeless tobacco: Never  Vaping Use   Vaping Use: Never used  Substance and Sexual Activity   Alcohol use: Yes    Alcohol/week: 2.0 - 3.0 standard drinks of alcohol    Types: 2 - 3 Cans of beer per week    Comment: occasion   Drug use: No   Sexual activity: Not on file  Other Topics Concern   Not on file  Social History Narrative   Lives at home with wife.    Social Determinants of Health   Financial Resource Strain: Low Risk  (06/17/2022)   Overall Financial Resource Strain (CARDIA)    Difficulty of Paying Living Expenses: Not hard at all  Food Insecurity: No Food Insecurity (06/17/2022)   Hunger Vital Sign    Worried About Running Out of Food in the Last Year: Never true    Ran Out of Food in the Last Year: Never true  Transportation Needs: No Transportation Needs (06/17/2022)   PRAPARE - Hydrologist (Medical): No    Lack of Transportation (Non-Medical): No  Physical Activity: Sufficiently Active (06/17/2022)   Exercise Vital Sign    Days of Exercise per Week: 7 days    Minutes of Exercise per Session: 60 min  Stress: No Stress Concern Present (06/17/2022)   [place]    Feeling of Stress : Not at all  Social  Connections: [place] (06/17/2022)   Social Connection and Isolation Panel [NHANES]    Frequency of Communication with Friends and Family: Three times a week    Frequency of Social Gatherings with Friends and Family: Three times a week    Attends Religious Services: More than 4 times per year    Active Member of Clubs or Organizations: Yes    Attends Archivist Meetings: More than 4 times per year    Marital Status: Married    Tobacco Counseling Counseling given: Not Answered  Clinical Intake:  Pre-visit preparation completed: Yes  Pain : No/denies pain     Nutritional Risks: None Diabetes: No  How often do you need to have someone help you when you read instructions, pamphlets, or other written materials from your doctor or pharmacy?: 1 - Never What is the last grade level you completed in school?: [place]?: No  Information entered by :: L.Wilson,LPN   Activities of Daily Living    06/17/2022    3:21 PM  In your present state of health, do you have any difficulty performing the following activities:  Hearing? 0  Vision? 0  Difficulty concentrating or making decisions? 0  Walking or climbing stairs? 0  Dressing or bathing? 0  Doing errands, shopping? 0  Preparing Food and eating ? N  Using the Toilet? N  In the past six months, have you accidently leaked urine? N  Do you have problems with loss of bowel control? N  Managing your Medications? N  Managing your Finances? N  Housekeeping or managing your Housekeeping? N    Patient Care Team: Wendie Agreste, MD as PCP - General (Family Medicine)  Indicate any recent Medical Services you may have received from other than [facility] providers in the past year (date may be approximate).     Assessment:   This is a routine wellness examination for [place].  Hearing/Vision screen Vision Screening - Comments:: Annual eye exams wear glasses   Dietary issues and  exercise activities discussed: Current Exercise Habits: Home exercise routine, Type of exercise: walking, Time (Minutes): 60, Frequency (Times/Week): 7, Weekly Exercise (Minutes/Week): 420, Intensity: Mild, Exercise limited by: None identified   Goals Addressed   None    Depression Screen    06/17/2022    3:21 PM 06/17/2022    3:19 PM 06/08/2022    3:22 PM 04/01/2021   10:23 AM 12/19/2020    3:59 PM 12/11/2020   11:08 AM 04/15/2015    4:02 PM  PHQ 2/9 Scores  PHQ - 2 Score 0 0 0 0 0 0 0  Exception Documentation    Patient refusal       Fall Risk    06/17/2022    3:22 PM 06/08/2022    3:21 PM 04/01/2021   10:23 AM 12/19/2020    3:59 PM 12/11/2020   11:08 AM  [place] in the past year? 0 0 0 0 0  Number falls in past yr: 0 0 0    Injury with Fall? 0 0 0    Risk for fall due to :  No Fall Risks     Follow up Falls evaluation completed;Education provided Falls evaluation completed Falls evaluation completed Falls evaluation completed Falls evaluation completed    [place]:  Any stairs in or around the home? Yes  If so, are there any without handrails? No  Home free of loose throw rugs in walkways, pet beds, electrical cords, etc? Yes  Adequate lighting in your home to reduce risk of falls? Yes   ASSISTIVE DEVICES UTILIZED TO PREVENT FALLS:  Life alert? No  Use of a cane, walker or w/c? No  Grab bars in the bathroom? No  Shower chair or bench in shower? Yes  Elevated toilet seat or a handicapped toilet? Yes     Cognitive Function:    Normal cognitive status assessed by telephone conversation by this Nurse Health Advisor. No abnormalities found.  Immunizations Immunization History  Administered Date(s) Administered   Fluad Quad(high Dose 65+) 10/18/2019, 09/27/2020   Influenza-Unspecified 11/07/2021   Tdap 04/15/2015    TDAP status: Up to date  Flu Vaccine status: Up to date  Pneumococcal vaccine status: Due,  Education has been provided regarding the importance of this vaccine. Advised may receive this vaccine at local pharmacy or Health Dept. Aware to provide a copy of the vaccination record if obtained from local pharmacy or Health Dept. Verbalized acceptance and understanding.  Covid-19 vaccine status: Completed vaccines  Qualifies for Shingles Vaccine? Yes   Zostavax completed Yes   Shingrix Completed?: Yes  Screening Tests Health Maintenance  Topic Date Due   Hepatitis C Screening  Never done   Zoster Vaccines- Shingrix (1 of 2) Never done   Pneumonia Vaccine 17+ Years old (1 - PCV) Never done   COVID-19 Vaccine (1) 06/24/2022 (Originally 11/30/1956)   INFLUENZA VACCINE  06/23/2022   Fecal DNA (Cologuard)  12/24/2023   TETANUS/TDAP  04/14/2025   HPV VACCINES  Aged Out    Health Maintenance  Health Maintenance Due  Topic Date Due   Hepatitis C Screening  Never done   Zoster Vaccines- Shingrix (1 of 2) Never done   Pneumonia Vaccine 27+ Years old (1 - PCV) Never done    Colorectal cancer screening: Type of screening: Cologuard. Completed 12/23/2020. Repeat every 3 years  Lung Cancer Screening: (Low Dose CT Chest recommended if Age 41-80 years, 30 pack-year currently smoking OR have quit w/in 15years.) does not qualify.   Lung Cancer Screening Referral: n/a  Additional Screening:  Hepatitis C Screening: does not qualify;  Vision Screening: Recommended annual ophthalmology exams for early detection of glaucoma and other disorders of the eye. Is the patient up to date with their annual eye exam?  Yes  Who is the provider or what is the name of the office in which the patient attends annual eye exams? Dr.Miller  If pt is not established with a provider, would they like to be referred to a provider to establish care? No .   Dental Screening: Recommended annual dental exams for proper oral hygiene  Community Resource Referral / Chronic Care Management: CRR required this visit?   No   CCM required this visit?  No      Plan:     I have personally reviewed and noted the following in the patient's chart:   Medical and social history Use of alcohol, tobacco or illicit drugs  Current medications and supplements including opioid prescriptions. Patient is not currently taking opioid prescriptions. Functional ability and status Nutritional status Physical activity Advanced directives List of other physicians Hospitalizations, surgeries, and ER visits in previous 12 months Vitals Screenings to include cognitive, depression, and falls Referrals and appointments  In addition, I have reviewed and discussed with patient certain preventive protocols, quality metrics, and best practice recommendations. A written personalized care plan for preventive services as well as general preventive health recommendations were provided to patient.     Daphane Shepherd, LPN   1/[id number]/0174   Nurse Notes: none

## 2022-06-23 ENCOUNTER — Encounter: Payer: Self-pay | Admitting: Family Medicine

## 2022-08-10 ENCOUNTER — Other Ambulatory Visit: Payer: Self-pay | Admitting: Cardiology

## 2022-09-15 ENCOUNTER — Telehealth: Payer: Self-pay

## 2022-09-15 NOTE — Telephone Encounter (Signed)
Bradley [name] requested call be made to patient in [place] to overdue physical.  Called to schedule patient a physical however patient did not answer LM to return call.   Found several open dates, 11/09/22 in the afternoon there is availability for 1 more physical as well as on 11/12/22 afternoon also has one available.  11/26/22 is first available morning physical.

## 2022-09-15 NOTE — Telephone Encounter (Signed)
Patient called back and did get scheduled

## 2022-09-29 ENCOUNTER — Encounter: Payer: Self-pay | Admitting: Family Medicine

## 2022-11-09 ENCOUNTER — Ambulatory Visit (INDEPENDENT_AMBULATORY_CARE_PROVIDER_SITE_OTHER): Payer: PPO | Admitting: Family Medicine

## 2022-11-09 ENCOUNTER — Encounter: Payer: Self-pay | Admitting: Family Medicine

## 2022-11-09 VITALS — BP 120/68 | HR 56 | Temp 98.2°F | Ht 71.0 in | Wt 186.6 lb

## 2022-11-09 DIAGNOSIS — E785 Hyperlipidemia, unspecified: Secondary | ICD-10-CM | POA: Diagnosis not present

## 2022-11-09 DIAGNOSIS — I10 Essential (primary) hypertension: Secondary | ICD-10-CM

## 2022-11-09 DIAGNOSIS — G4700 Insomnia, unspecified: Secondary | ICD-10-CM | POA: Diagnosis not present

## 2022-11-09 DIAGNOSIS — Z9861 Coronary angioplasty status: Secondary | ICD-10-CM | POA: Diagnosis not present

## 2022-11-09 DIAGNOSIS — Z23 Encounter for immunization: Secondary | ICD-10-CM

## 2022-11-09 DIAGNOSIS — Z0000 Encounter for general adult medical examination without abnormal findings: Secondary | ICD-10-CM | POA: Diagnosis not present

## 2022-11-09 DIAGNOSIS — Z1159 Encounter for screening for other viral diseases: Secondary | ICD-10-CM | POA: Diagnosis not present

## 2022-11-09 DIAGNOSIS — I2510 Atherosclerotic heart disease of native coronary artery without angina pectoris: Secondary | ICD-10-CM

## 2022-11-09 NOTE — Patient Instructions (Addendum)
I would consider meeting with a sleep specialist, let me know when I can refer you. See info on insomnia below as well.    Insomnia Insomnia is a sleep disorder that makes it difficult to fall asleep or stay asleep. Insomnia can cause fatigue, low energy, difficulty concentrating, mood swings, and poor performance at work or school. There are three different ways to classify insomnia: Difficulty falling asleep. Difficulty staying asleep. Waking up too early in the morning. Any type of insomnia can be long-term (chronic) or short-term (acute). Both are common. Short-term insomnia usually lasts for 3 months or less. Chronic insomnia occurs at least three times a week for longer than 3 months. What are the causes? Insomnia may be caused by another condition, situation, or substance, such as: Having certain mental health conditions, such as anxiety and depression. Using caffeine, alcohol, tobacco, or drugs. Having gastrointestinal conditions, such as gastroesophageal reflux disease (GERD). Having certain medical conditions. These include: Asthma. Alzheimer's disease. Stroke. Chronic pain. An overactive thyroid gland (hyperthyroidism). Other sleep disorders, such as restless legs syndrome and sleep apnea. Menopause. Sometimes, the cause of insomnia may not be known. What increases the risk? Risk factors for insomnia include: Gender. Females are affected more often than males. Age. Insomnia is more common as people get older. Stress and certain medical and mental health conditions. Lack of exercise. Having an irregular work schedule. This may include working night shifts and traveling between different time zones. What are the signs or symptoms? If you have insomnia, the main symptom is having trouble falling asleep or having trouble staying asleep. This may lead to other symptoms, such as: Feeling tired or having low energy. Feeling nervous about going to sleep. Not feeling rested in  the morning. Having trouble concentrating. Feeling irritable, anxious, or depressed. How is this diagnosed? This condition may be diagnosed based on: Your symptoms and medical history. Your health care provider may ask about: Your sleep habits. Any medical conditions you have. Your mental health. A physical exam. How is this treated? Treatment for insomnia depends on the cause. Treatment may focus on treating an underlying condition that is causing the insomnia. Treatment may also include: Medicines to help you sleep. Counseling or therapy. Lifestyle adjustments to help you sleep better. Follow these instructions at home: Eating and drinking  Limit or avoid alcohol, caffeinated beverages, and products that contain nicotine and tobacco, especially close to bedtime. These can disrupt your sleep. Do not eat a large meal or eat spicy foods right before bedtime. This can lead to digestive discomfort that can make it hard for you to sleep. Sleep habits  Keep a sleep diary to help you and your health care provider figure out what could be causing your insomnia. Write down: When you sleep. When you wake up during the night. How well you sleep and how rested you feel the next day. Any side effects of medicines you are taking. What you eat and drink. Make your bedroom a dark, comfortable place where it is easy to fall asleep. Put up shades or blackout curtains to block light from outside. Use a white noise machine to block noise. Keep the temperature cool. Limit screen use before bedtime. This includes: Not watching TV. Not using your smartphone, tablet, or computer. Stick to a routine that includes going to bed and waking up at the same times every day and night. This can help you fall asleep faster. Consider making a quiet activity, such as reading, part of your nighttime  routine. Try to avoid taking naps during the day so that you sleep better at night. Get out of bed if you are still  awake after 15 minutes of trying to sleep. Keep the lights down, but try reading or doing a quiet activity. When you feel sleepy, go back to bed. General instructions Take over-the-counter and prescription medicines only as told by your health care provider. Exercise regularly as told by your health care provider. However, avoid exercising in the hours right before bedtime. Use relaxation techniques to manage stress. Ask your health care provider to suggest some techniques that may work well for you. These may include: Breathing exercises. Routines to release muscle tension. Visualizing peaceful scenes. Make sure that you drive carefully. Do not drive if you feel very sleepy. Keep all follow-up visits. This is important. Contact a health care provider if: You are tired throughout the day. You have trouble in your daily routine due to sleepiness. You continue to have sleep problems, or your sleep problems get worse. Get help right away if: You have thoughts about hurting yourself or someone else. Get help right away if you feel like you may hurt yourself or others, or have thoughts about taking your own life. Go to your nearest emergency room or: Call 911. Call the [place] at [phone] or 988. This is open 24 hours a day. Text the Crisis Text Line at (272)748-3253. Summary Insomnia is a sleep disorder that makes it difficult to fall asleep or stay asleep. Insomnia can be long-term (chronic) or short-term (acute). Treatment for insomnia depends on the cause. Treatment may focus on treating an underlying condition that is causing the insomnia. Keep a sleep diary to help you and your health care provider figure out what could be causing your insomnia. This information is not intended to replace advice given to you by your health care provider. Make sure you discuss any questions you have with your health care provider. Document Revised: 10/20/2021 Document Reviewed:  10/20/2021 Elsevier Patient Education  [place] 65 Years and Older, Male Preventive care refers to lifestyle choices and visits with your health care provider that can promote health and wellness. Preventive care visits are also called wellness exams. What can I expect for my preventive care visit? Counseling During your preventive care visit, your health care provider may ask about your: Medical history, including: Past medical problems. Family medical history. History of falls. Current health, including: Emotional well-being. Home life and relationship well-being. Sexual activity. Memory and ability to understand (cognition). Lifestyle, including: Alcohol, nicotine or tobacco, and drug use. Access to firearms. Diet, exercise, and sleep habits. Work and work Statistician. Sunscreen use. Safety issues such as seatbelt and bike helmet use. Physical exam Your health care provider will check your: Height and weight. These may be used to calculate your BMI (body mass index). BMI is a measurement that tells if you are at a healthy weight. Waist circumference. This measures the distance around your waistline. This measurement also tells if you are at a healthy weight and may help predict your risk of certain diseases, such as type 2 diabetes and high blood pressure. Heart rate and blood pressure. Body temperature. Skin for abnormal spots. What immunizations do I need?  Vaccines are usually given at various ages, according to a schedule. Your health care provider will recommend vaccines for you based on your age, medical history, and lifestyle or other factors, such as travel or where you work.  What tests do I need? Screening Your health care provider may recommend screening tests for certain conditions. This may include: Lipid and cholesterol levels. Diabetes screening. This is done by checking your blood sugar (glucose) after you have not eaten for a while  (fasting). Hepatitis C test. Hepatitis B test. HIV (human immunodeficiency virus) test. STI (sexually transmitted infection) testing, if you are at risk. Lung cancer screening. Colorectal cancer screening. Prostate cancer screening. Abdominal aortic aneurysm (AAA) screening. You may need this if you are a current or former smoker. Talk with your health care provider about your test results, treatment options, and if necessary, the need for more tests. Follow these instructions at home: Eating and drinking  Eat a diet that includes fresh fruits and vegetables, whole grains, lean protein, and low-fat dairy products. Limit your intake of foods with high amounts of sugar, saturated fats, and salt. Take vitamin and mineral supplements as recommended by your health care provider. Do not drink alcohol if your health care provider tells you not to drink. If you drink alcohol: Limit how much you have to 0-2 drinks a day. Know how much alcohol is in your drink. In the [place] one drink equals one 12 oz bottle of beer (355 mL), one 5 oz glass of wine (148 mL), or one 1 oz glass of hard liquor (44 mL). Lifestyle Brush your teeth every morning and night with fluoride toothpaste. Floss one time each day. Exercise for at least 30 minutes 5 or more days each week. Do not use any products that contain nicotine or tobacco. These products include cigarettes, chewing tobacco, and vaping devices, such as e-cigarettes. If you need help quitting, ask your health care provider. Do not use drugs. If you are sexually active, practice safe sex. Use a condom or other form of protection to prevent STIs. Take aspirin only as told by your health care provider. Make sure that you understand how much to take and what form to take. Work with your health care provider to find out whether it is safe and beneficial for you to take aspirin daily. Ask your health care provider if you need to take a cholesterol-lowering medicine  (statin). Find healthy ways to manage stress, such as: Meditation, yoga, or listening to music. Journaling. Talking to a trusted person. Spending time with friends and family. Safety Always wear your seat belt while driving or riding in a vehicle. Do not drive: If you have been drinking alcohol. Do not ride with someone who has been drinking. When you are tired or distracted. While texting. If you have been using any mind-altering substances or drugs. Wear a helmet and other protective equipment during sports activities. If you have firearms in your house, make sure you follow all gun safety procedures. Minimize exposure to UV radiation to reduce your risk of skin cancer. What's next? Visit your health care provider once a year for an annual wellness visit. Ask your health care provider how often you should have your eyes and teeth checked. Stay up to date on all vaccines. This information is not intended to replace advice given to you by your health care provider. Make sure you discuss any questions you have with your health care provider. Document Revised: 05/07/2021 Document Reviewed: 05/07/2021 Elsevier Patient Education  [place].

## 2022-11-09 NOTE — Progress Notes (Unsigned)
Subjective:  Patient ID: Bradley [name], male    DOB: 09-25-1956  Age: 66 y.o. MRN: [id number]  CC:  Chief Complaint  Patient presents with   Annual Exam    Pt states he has trouble staying asleep, pt states it has been going on for about a year    HPI Bradley [name] presents for Annual Exam And concern for insomnia as above  Cardiology, Dr. Percival Spanish, CAD, hypertension, dyslipidemia, treated with Lipitor, lisinopril, Toprol, aspirin.  Labs with cardiology in July without change in therapy. Prior hip pain improved, treated with advil. Much better.   Insomnia: Goes to bed at 10-10:30. 5-6 hrs later wakes up and mind turns on. Usually 7 hrs sleep still, but fragmented. Trouble getting back to sleep. Past year. Does snore. No PND, no orthopnea. Some daytime somnolence, not with driving.  Nocturia once he is up, but not need to wake. Once.  Sometimes exercises and falls back asleep.  Tx: melatonin gummies.  Some marital challenges few months ago, improving. No change in sleep during that time.  No prior OSA testing.  Lab Results  Component Value Date   CHOL 104 05/29/2022   HDL 40 05/29/2022   LDLCALC 52 05/29/2022   TRIG 46 05/29/2022   CHOLHDL 2.6 05/29/2022   BP Readings from Last 3 Encounters:  11/09/22 120/68  06/08/22 90/60  05/29/22 110/80   Lab Results  Component Value Date   CREATININE 0.96 05/29/2022  No new med side effects.      11/09/2022    1:20 PM 06/17/2022    3:21 PM 06/17/2022    3:19 PM 06/08/2022    3:22 PM 04/01/2021   10:23 AM  Depression screen PHQ 2/9  Decreased Interest 0 0 0 0 0  Down, Depressed, Hopeless 0 0 0 0 0  PHQ - 2 Score 0 0 0 0 0  Altered sleeping 2      Tired, decreased energy 1      Change in appetite 0      Feeling bad or failure about yourself  0      Trouble concentrating 0      Moving slowly or fidgety/restless 0      Suicidal thoughts 0      PHQ-9 Score 3        Health Maintenance  Topic Date Due   Hepatitis C  Screening  Never [name]   Zoster Vaccines- Shingrix (1 of 2) 05/30/1973   COVID-19 Vaccine (2 - Pfizer risk series) 11/11/2022   Pneumonia Vaccine 21+ Years old (1 - PCV) 11/10/2023 (Originally 05/31/2019)   Medicare Annual Wellness (AWV)  06/18/2023   Fecal DNA (Cologuard)  12/24/2023   DTaP/Tdap/Td (2 - Td or Tdap) 04/14/2025   INFLUENZA VACCINE  Completed   HPV VACCINES  Aged Out  Cologuard negative 12/23/20. Prostate: does not have family history of prostate cancer The natural history of prostate cancer and ongoing controversy regarding screening and potential treatment outcomes of prostate cancer has been discussed with the patient. The meaning of a false positive PSA and a false negative PSA has been discussed. He indicates understanding of the limitations of this screening test and wishes NOT to proceed with screening PSA testing. No results found for: "PSA1", "PSA"   Immunization History  Administered Date(s) Administered   Fluad Quad(high Dose 65+) 10/18/2019, 09/27/2020   Influenza, High Dose Seasonal PF 10/21/2022   Influenza-Unspecified 11/07/2021   Pfizer Covid-19 Vaccine Bivalent Booster 69yr & up 10/21/2022  Tdap 04/15/2015   Zoster, Unspecified 09/16/2022  COVID and flu vaccines on 09/29/2022 at [facility]. Shingrix s/p1 at pharmacy - plans on second.  Pneumonia vaccine: prevnar 20 today.   No results found. Wears glasses - yearly appt - coming up soon.   Dental: every 6 months.   Alcohol: wine - 1-2 the most per day. Usually 7 per week.   Tobacco: none.   Exercise: cycling usually, 4-5 times per week. Body weight exercises, yard work. Manages local park.   History Patient Active Problem List   Diagnosis Date Noted   Coronary artery disease involving native coronary artery of native heart without angina pectoris 11/01/2019   Essential hypertension 11/01/2019   Educated about COVID-19 virus infection 11/01/2019   CAD S/P LAD DES 08/14/2015   Dyslipidemia  08/14/2015   Acute ST elevation myocardial infarction (STEMI) involving left anterior descending coronary artery ([place]) 08/02/2015   Cardiomyopathy, ischemic:  08/02/2015   Family history of early CAD 05/30/2015   Right inguinal hernia 10/09/2013   ARTHRITIS, RIGHT HIP 04/29/2009   Pain in limb 08/30/2007   UNEQUAL LEG LENGTH, ACQUIRED 08/30/2007   Past Medical History:  Diagnosis Date   CAD in native artery    LV dysfunction    EF post MI 35-40%   STEMI (ST elevation myocardial infarction) ([place])    ant wall,  Xience alpine to LAD     Past Surgical History:  Procedure Laterality Date   CARDIAC CATHETERIZATION N/A 08/01/2015   Procedure: Left Heart Cath and Coronary Angiography;  Surgeon: Leonie Man, MD;  Location: [place] [facility];  Service: Cardiovascular;  Laterality: N/A;   CARDIAC CATHETERIZATION N/A 08/01/2015   Procedure: Coronary Stent Intervention;  Surgeon: Leonie Man, MD;  Location: [place] [facility];  Service: Cardiovascular;  Laterality: N/A;   HERNIA REPAIR     INGUINAL HERNIA REPAIR Right 10/23/2013   Procedure: HERNIA REPAIR INGUINAL ADULT;  Surgeon: Harl Bowie, MD;  Location: [place];  Service: General;  Laterality: Right;   INGUINAL HERNIA REPAIR Left 12/24/2016   Procedure: LEFT INGUINAL HERNIA REPAIR;  Surgeon: Coralie Keens, MD;  Location: [place];  Service: General;  Laterality: Left;   INSERTION OF MESH Right 10/23/2013   Procedure: INSERTION OF MESH;  Surgeon: Harl Bowie, MD;  Location: [place];  Service: General;  Laterality: Right;   INSERTION OF MESH Left 12/24/2016   Procedure: INSERTION OF MESH;  Surgeon: Coralie Keens, MD;  Location: [place];  Service: General;  Laterality: Left;   JOINT REPLACEMENT Right    hip resurfacing   REPAIR ANKLE LIGAMENT     TONSILLECTOMY     Allergies  Allergen Reactions   No Known Allergies    Prior to Admission medications   Medication Sig Start Date End Date Taking? Authorizing Provider  aspirin EC  81 MG tablet Take 81 mg by mouth daily.   Yes [provider]  atorvastatin (LIPITOR) 80 MG tablet TAKE 1 TABLET(80 MG) BY MOUTH DAILY AT 6 PM 08/10/22  Yes Minus Breeding, MD  Coenzyme Q10 (CO Q-10) 100 MG CAPS Take 100 mg by mouth 2 (two) times daily.   Yes [provider]  lisinopril (ZESTRIL) 2.5 MG tablet TAKE 1 TABLET(2.5 MG) BY MOUTH DAILY 08/10/22  Yes Minus Breeding, MD  metoprolol succinate (TOPROL-XL) 25 MG 24 hr tablet TAKE 1/2 TABLET(12.5 MG) BY MOUTH DAILY 04/06/22  Yes Hochrein, Jeneen Rinks, MD  nitroGLYCERIN (NITROSTAT) 0.4 MG SL tablet DISSOLVE 1 TABLET UNDER  THE TONGUE UNDER THE TONGUE EVERY 5 MINUTES AS NEEDED FOR CHEST PAIN 05/29/22  Yes Minus Breeding, MD   Social History   Socioeconomic History   Marital status: Married    Spouse name: Not on file   Number of children: 4   Years of education: Not on file   Highest education level: Not on file  Occupational History   Occupation: Facilities manager  Tobacco Use   Smoking status: Never   Smokeless tobacco: Never  Vaping Use   Vaping Use: Never used  Substance and Sexual Activity   Alcohol use: Yes    Alcohol/week: 2.0 - 3.0 standard drinks of alcohol    Types: 2 - 3 Cans of beer per week    Comment: occasion   Drug use: No   Sexual activity: Not on file  Other Topics Concern   Not on file  Social History Narrative   Lives at home with wife.    Social Determinants of Health   Financial Resource Strain: Low Risk  (06/17/2022)   Overall Financial Resource Strain (CARDIA)    Difficulty of Paying Living Expenses: Not hard at all  Food Insecurity: No Food Insecurity (06/17/2022)   Hunger Vital Sign    Worried About Running Out of Food in the Last Year: Never true    Ran Out of Food in the Last Year: Never true  Transportation Needs: No Transportation Needs (06/17/2022)   PRAPARE - Hydrologist (Medical): No    Lack of Transportation (Non-Medical): No  Physical Activity:  Sufficiently Active (06/17/2022)   Exercise Vital Sign    Days of Exercise per Week: 7 days    Minutes of Exercise per Session: 60 min  Stress: No Stress Concern Present (06/17/2022)   [place]    Feeling of Stress : Not at all  Social Connections: [place] (06/17/2022)   Social Connection and Isolation Panel [NHANES]    Frequency of Communication with Friends and Family: Three times a week    Frequency of Social Gatherings with Friends and Family: Three times a week    Attends Religious Services: More than 4 times per year    Active Member of Clubs or Organizations: Yes    Attends Archivist Meetings: More than 4 times per year    Marital Status: Married  Human resources officer Violence: Not At Risk (06/17/2022)   Humiliation, Afraid, Rape, and Kick questionnaire    Fear of Current or Ex-Partner: No    Emotionally Abused: No    Physically Abused: No    Sexually Abused: No    Review of Systems 13 point review of systems per patient health survey noted.  Negative other than as indicated above or in HPI.    Objective:   Vitals:   11/09/22 1325  BP: 120/68  Pulse: (!) 56  Temp: 98.2 F (36.8 C)  SpO2: 99%  Weight: 186 lb 9.6 oz (84.6 kg)  Height: '5\' 11"'$  (1.803 m)   {Vitals History (Optional):23777}  Physical Exam Vitals reviewed.  Constitutional:      Appearance: He is well-developed.  HENT:     Head: Normocephalic and atraumatic.     Right Ear: External ear normal.     Left Ear: External ear normal.  Eyes:     Conjunctiva/sclera: Conjunctivae normal.     Pupils: Pupils are equal, round, and reactive to light.  Neck:     Thyroid: No thyromegaly.  Cardiovascular:     Rate and Rhythm: Normal rate and regular rhythm.     Heart sounds: Normal heart sounds.  Pulmonary:     Effort: Pulmonary effort is normal. No respiratory distress.     Breath sounds: Normal breath sounds. No wheezing.   Abdominal:     General: There is no distension.     Palpations: Abdomen is soft.     Tenderness: There is no abdominal tenderness.  Musculoskeletal:        General: No tenderness. Normal range of motion.     Cervical back: Normal range of motion and neck supple.  Lymphadenopathy:     Cervical: No cervical adenopathy.  Skin:    General: Skin is warm and dry.  Neurological:     Mental Status: He is alert and oriented to person, place, and time.     Deep Tendon Reflexes: Reflexes are normal and symmetric.  Psychiatric:        Behavior: Behavior normal.        Assessment & Plan:  Bradley [name] is a 66 y.o. male . No diagnosis found.   No orders of the defined types were placed in this encounter.  There are no Patient Instructions on file for this visit.    Signed,   Merri Ray, MD [place] 11/09/22 1:53 PM

## 2022-11-10 LAB — LIPID PANEL
Cholesterol: 99 mg/dL (ref 0–200)
HDL: 38.8 mg/dL — ABNORMAL LOW (ref >39.00)
LDL Cholesterol: 44 mg/dL (ref 0–99)
NonHDL: 59.83
Total CHOL/HDL Ratio: 3
Triglycerides: 80 mg/dL (ref 0.0–149.0)
VLDL: 16 mg/dL (ref 0.0–40.0)

## 2022-11-10 LAB — COMPREHENSIVE METABOLIC PANEL
ALT: 23 U/L (ref 0–53)
AST: 25 U/L (ref 0–37)
Albumin: 4.3 g/dL (ref 3.5–5.2)
Alkaline Phosphatase: 42 U/L (ref 39–117)
BUN: 23 mg/dL (ref 6–23)
CO2: 30 mEq/L (ref 19–32)
Calcium: 9.3 mg/dL (ref 8.4–10.5)
Chloride: 99 mEq/L (ref 96–112)
Creatinine, Ser: 0.99 mg/dL (ref 0.40–1.50)
GFR: 78.31 mL/min (ref >60.00)
Glucose, Bld: 82 mg/dL (ref 70–99)
Potassium: 4.3 mEq/L (ref 3.5–5.1)
Sodium: 136 mEq/L (ref 135–145)
Total Bilirubin: 0.5 mg/dL (ref 0.2–1.2)
Total Protein: 6.3 g/dL (ref 6.0–8.3)

## 2022-11-10 LAB — HEPATITIS C ANTIBODY: Hepatitis C Ab: NONREACTIVE

## 2022-11-11 ENCOUNTER — Encounter: Payer: Self-pay | Admitting: Family Medicine

## 2023-03-08 ENCOUNTER — Telehealth: Payer: Self-pay | Admitting: Family Medicine

## 2023-03-08 NOTE — Telephone Encounter (Signed)
Contacted Fransisco A Quizhpi to schedule their annual wellness visit. Appointment made for 03/09/2022.  Thank you,  [facility] Support [facility] Direct dial  [phone]

## 2023-03-10 ENCOUNTER — Ambulatory Visit (INDEPENDENT_AMBULATORY_CARE_PROVIDER_SITE_OTHER): Payer: PPO | Admitting: *Deleted

## 2023-03-10 DIAGNOSIS — Z0000 Encounter for general adult medical examination without abnormal findings: Secondary | ICD-10-CM

## 2023-03-10 NOTE — Progress Notes (Signed)
Subjective:   Bradley [name] is a 67 y.o. male who presents for Medicare Annual/Subsequent preventive examination.  I connected with  Osman Calzadilla Appleyard on 03/10/23 by a telephone enabled telemedicine application and verified that I am speaking with the correct person using two identifiers.   I discussed the limitations of evaluation and management by telemedicine. The patient expressed understanding and agreed to proceed.  Patient location: home  Provider location: telephone/home    Review of Systems     Cardiac Risk Factors include: advanced age (>33men, >70 women);hypertension;male gender     Objective:    Today's Vitals   There is no height or weight on file to calculate BMI.     03/10/2023   11:56 AM 06/17/2022    3:21 PM 12/22/2016    8:30 AM 12/22/2016    8:22 AM 08/01/2015   10:10 PM 10/16/2013    1:26 PM  Advanced Directives  Does Patient Have a Medical Advance Directive? Yes Yes Yes Yes No Patient has advance directive, copy not in chart  Type of Advance Directive Healthcare Power of [employer] of [place];Living will Healthcare Power of [place];Living will Living will;Healthcare Power of Attorney    Does patient want to make changes to medical advance directive?   No - Patient declined No - Patient declined  No  Copy of Healthcare Power of Attorney in Chart? Yes - validated most recent copy scanned in chart (See row information) No - copy requested No - copy requested No - copy requested      Current Medications (verified) Outpatient Encounter Medications as of 03/10/2023  Medication Sig   aspirin EC 81 MG tablet Take 81 mg by mouth daily.   atorvastatin (LIPITOR) 80 MG tablet TAKE 1 TABLET(80 MG) BY MOUTH DAILY AT 6 PM   Coenzyme Q10 (CO Q-10) 100 MG CAPS Take 100 mg by mouth 2 (two) times daily.   lisinopril (ZESTRIL) 2.5 MG tablet TAKE 1 TABLET(2.5 MG) BY MOUTH DAILY   metoprolol succinate (TOPROL-XL) 25 MG 24 hr tablet TAKE 1/2 TABLET(12.5 MG) BY  MOUTH DAILY   nitroGLYCERIN (NITROSTAT) 0.4 MG SL tablet DISSOLVE 1 TABLET UNDER THE TONGUE UNDER THE TONGUE EVERY 5 MINUTES AS NEEDED FOR CHEST PAIN   No facility-administered encounter medications on file as of 03/10/2023.    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%   STEMI (ST elevation myocardial infarction)    ant wall,  Xience alpine to LAD     Past Surgical History:  Procedure Laterality Date   CARDIAC CATHETERIZATION N/A 08/01/2015   Procedure: Left Heart Cath and Coronary Angiography;  Surgeon: Marykay Lex, MD;  Location: [facility] INVASIVE CV LAB;  Service: Cardiovascular;  Laterality: N/A;   CARDIAC CATHETERIZATION N/A 08/01/2015   Procedure: Coronary Stent Intervention;  Surgeon: Marykay Lex, MD;  Location: [facility] INVASIVE CV LAB;  Service: Cardiovascular;  Laterality: N/A;   HERNIA REPAIR     INGUINAL HERNIA REPAIR Right 10/23/2013   Procedure: HERNIA REPAIR INGUINAL ADULT;  Surgeon: Shelly Rubenstein, MD;  Location: [facility] OR;  Service: General;  Laterality: Right;   INGUINAL HERNIA REPAIR Left 12/24/2016   Procedure: LEFT INGUINAL HERNIA REPAIR;  Surgeon: Abigail Miyamoto, MD;  Location: [facility] OR;  Service: General;  Laterality: Left;   INSERTION OF MESH Right 10/23/2013   Procedure: INSERTION OF MESH;  Surgeon: Shelly Rubenstein, MD;  Location: [facility] OR;  Service: General;  Laterality: Right;   INSERTION OF MESH Left 12/24/2016   Procedure: INSERTION OF MESH;  Surgeon: Abigail Miyamoto, MD;  Location: [facility] OR;  Service: General;  Laterality: Left;   JOINT REPLACEMENT Right    hip resurfacing   REPAIR ANKLE LIGAMENT     TONSILLECTOMY     Family History  Problem Relation Age of Onset   Stroke Mother 27   Cancer Mother        breast   CAD Mother 36   CAD Father 12       CABG   CAD Sister 73   Cancer Brother        testicular cancer   Hyperlipidemia Brother     Hyperlipidemia Brother    Social History   Socioeconomic History   Marital status: Married    Spouse name: Not on file   Number of children: 4   Years of education: Not on file   Highest education level: Not on file  Occupational History   Occupation: Radiation protection practitioner  Tobacco Use   Smoking status: Never   Smokeless tobacco: Never  Vaping Use   Vaping Use: Never used  Substance and Sexual Activity   Alcohol use: Yes    Alcohol/week: 2.0 - 3.0 standard drinks of alcohol    Types: 2 - 3 Cans of beer per week    Comment: occasion   Drug use: No   Sexual activity: Not on file  Other Topics Concern   Not on file  Social History Narrative   Lives at home with wife.    Social Determinants of Health   Financial Resource Strain: Low Risk  (03/10/2023)   Overall Financial Resource Strain (CARDIA)    Difficulty of Paying Living Expenses: Not hard at all  Food Insecurity: No Food Insecurity (03/10/2023)   Hunger Vital Sign    Worried About Running Out of Food in the Last Year: Never true    Ran Out of Food in the Last Year: Never true  Transportation Needs: No Transportation Needs (03/10/2023)   PRAPARE - Administrator, Civil Service (Medical): No    Lack of Transportation (Non-Medical): No  Physical Activity: Sufficiently Active (03/10/2023)   Exercise Vital Sign    Days of Exercise per Week: 4 days    Minutes of Exercise per Session: 40 min  Stress: No Stress Concern Present (03/10/2023)   [employer] of Occupational Health - Occupational Stress Questionnaire    Feeling of Stress : Not at all  Social Connections: Socially Integrated (03/10/2023)   Social Connection and Isolation Panel [NHANES]    Frequency of Communication with Friends and Family: More than three times a week    Frequency of Social Gatherings with Friends and Family: More than three times a week    Attends Religious Services: More than 4 times per year    Active Member of [employer] or  Organizations: Yes    Attends Engineer, structural: More than 4 times per year    Marital Status: Married    Tobacco Counseling Counseling given: Not Answered   Clinical Intake:  Pre-visit preparation completed: Yes  Pain : No/denies pain     Diabetes: No     Diabetic?  no  Interpreter Needed?: No  Information entered by :: Remi Haggard LPN   Activities of Daily Living    03/10/2023   11:41 AM 06/17/2022    3:21  PM  In your present state of health, do you have any difficulty performing the following activities:  Hearing? 0 0  Vision? 0 0  Difficulty concentrating or making decisions? 0 0  Walking or climbing stairs? 0 0  Dressing or bathing? 0 0  Doing errands, shopping? 0 0  Preparing Food and eating ? N N  Using the Toilet? N N  In the past six months, have you accidently leaked urine? N N  Do you have problems with loss of bowel control? N N  Managing your Medications? N N  Managing your Finances? N N  Housekeeping or managing your Housekeeping?  N    Patient Care Team: Shade Flood, MD as PCP - General (Family Medicine)  Indicate any recent Medical Services you may have received from other than [facility] providers in the past year (date may be approximate).     Assessment:   This is a routine wellness examination for Laquan.  Hearing/Vision screen Hearing Screening - Comments:: Does not wear hearing aids Vision Screening - Comments:: Up to date [facility]  Dietary issues and exercise activities discussed: Current Exercise Habits: Structured exercise class;Home exercise routine, Time (Minutes): 50, Frequency (Times/Week): 4, Weekly Exercise (Minutes/Week): 200, Intensity: Moderate   Goals Addressed             This Visit's Progress    Patient Stated       No goals       Depression Screen    03/10/2023   11:42 AM 11/09/2022    1:20 PM 06/17/2022    3:21 PM 06/17/2022    3:19 PM 06/08/2022    3:22 PM 04/01/2021   10:23 AM 12/19/2020     3:59 PM  PHQ 2/9 Scores  PHQ - 2 Score 0 0 0 0 0 0 0  PHQ- 9 Score 2 3       Exception Documentation      Patient refusal     Fall Risk    03/10/2023   11:40 AM 11/09/2022    1:20 PM 06/17/2022    3:22 PM 06/08/2022    3:21 PM 04/01/2021   10:23 AM  Fall Risk   Falls in the past year? 0 0 0 0 0  Number falls in past yr: 0 0 0 0 0  Injury with Fall? 0 0 0 0 0  Risk for fall due to :  No Fall Risks  No Fall Risks   Follow up Falls evaluation completed;Education provided;Falls prevention discussed Falls evaluation completed Falls evaluation completed;Education provided Falls evaluation completed Falls evaluation completed    FALL RISK PREVENTION PERTAINING TO THE HOME:  Any stairs in or around the home? Yes  If so, are there any without handrails? No  Home free of loose throw rugs in walkways, pet beds, electrical cords, etc? Yes  Adequate lighting in your home to reduce risk of falls? Yes   ASSISTIVE DEVICES UTILIZED TO PREVENT FALLS:  Life alert? No  Use of a cane, walker or w/c? No  Grab bars in the bathroom? No  Shower chair or bench in shower? No  Elevated toilet seat or a handicapped toilet? No   TIMED UP AND GO:  Was the test performed? No .    Cognitive Function:        03/10/2023   11:40 AM  6CIT Screen  What Year? 0 points  What month? 0 points  What time? 0 points  Count back from 20 0 points  Months  in reverse 0 points  Repeat phrase 0 points  Total Score 0 points    Immunizations Immunization History  Administered Date(s) Administered   Fluad Quad(high Dose 65+) 10/18/2019, 09/27/2020   Influenza, High Dose Seasonal PF 10/21/2022   Influenza-Unspecified 11/07/2021, 09/29/2022   PNEUMOCOCCAL CONJUGATE-20 11/09/2022   Pfizer Covid-19 Vaccine Bivalent Booster 57yrs & up 09/29/2022, 10/21/2022   Tdap 04/15/2015   Zoster Recombinat (Shingrix) 02/19/2022   Zoster, Unspecified 09/16/2022    TDAP status: Up to date  Flu Vaccine status: Up to  date  Pneumococcal vaccine status: Up to date  Covid-19 vaccine status: Completed vaccines  Qualifies for Shingles Vaccine? No   Zostavax completed Yes   Shingrix Completed?: Yes  Screening Tests Health Maintenance  Topic Date Due   COVID-19 Vaccine (3 - Pfizer risk series) 03/26/2023 (Originally 11/18/2022)   INFLUENZA VACCINE  06/24/2023   Fecal DNA (Cologuard)  12/24/2023   Medicare Annual Wellness (AWV)  03/09/2024   DTaP/Tdap/Td (2 - Td or Tdap) 04/14/2025   Pneumonia Vaccine 7+ Years old  Completed   Hepatitis C Screening  Completed   Zoster Vaccines- Shingrix  Completed   HPV VACCINES  Aged Out    Health Maintenance  There are no preventive care reminders to display for this patient.   Colorectal cancer screening: Type of screening: Cologuard. Completed  . Repeat every 3 years  Lung Cancer Screening: (Low Dose CT Chest recommended if Age 5-80 years, 30 pack-year currently smoking OR have quit w/in 15years.) does not qualify.   Lung Cancer Screening Referral:   Additional Screening:  Hepatitis C Screening: does not qualify; Completed 2023  Vision Screening: Recommended annual ophthalmology exams for early detection of glaucoma and other disorders of the eye. Is the patient up to date with their annual eye exam?  Yes  Who is the provider or what is the name of the office in which the patient attends annual eye exams? [facility] If pt is not established with a provider, would they like to be referred to a provider to establish care? No .   Dental Screening: Recommended annual dental exams for proper oral hygiene  Community Resource Referral / Chronic Care Management: CRR required this visit?  No   CCM required this visit?  No      Plan:     I have personally reviewed and noted the following in the patient's chart:   Medical and social history Use of alcohol, tobacco or illicit drugs  Current medications and supplements including opioid prescriptions.  Patient is not currently taking opioid prescriptions. Functional ability and status Nutritional status Physical activity Advanced directives List of other physicians Hospitalizations, surgeries, and ER visits in previous 12 months Vitals Screenings to include cognitive, depression, and falls Referrals and appointments  In addition, I have reviewed and discussed with patient certain preventive protocols, quality metrics, and best practice recommendations. A written personalized care plan for preventive services as well as general preventive health recommendations were provided to patient.     Remi Haggard, LPN   1/61/0960   Nurse Notes:

## 2023-03-10 NOTE — Patient Instructions (Signed)
Bradley [name] , Thank you for taking time to come for your Medicare Wellness Visit. I appreciate your ongoing commitment to your health goals. Please review the following plan we discussed and let me know if I can assist you in the future.   Screening recommendations/referrals: Colonoscopy: up to date Recommended yearly ophthalmology/optometry visit for glaucoma screening and checkup Recommended yearly dental visit for hygiene and checkup  Vaccinations: Influenza vaccine: up to date Pneumococcal vaccine: up to date Tdap vaccine: up to date Shingles vaccine: up to date    Advanced  directives completed      Preventive Care 65 Years and Older, Male Preventive care refers to lifestyle choices and visits with your health care provider that can promote health and wellness. What does preventive care include? A yearly physical exam. This is also called an annual well check. Dental exams once or twice a year. Routine eye exams. Ask your health care provider how often you should have your eyes checked. Personal lifestyle choices, including: Daily care of your teeth and gums. Regular physical activity. Eating a healthy diet. Avoiding tobacco and drug use. Limiting alcohol use. Practicing safe sex. Taking low doses of aspirin every day. Taking vitamin and mineral supplements as recommended by your health care provider. What happens during an annual well check? The services and screenings done by your health care provider during your annual well check will depend on your age, overall health, lifestyle risk factors, and family history of disease. Counseling  Your health care provider may ask you questions about your: Alcohol use. Tobacco use. Drug use. Emotional well-being. Home and relationship well-being. Sexual activity. Eating habits. History of falls. Memory and ability to understand (cognition). Work and work Astronomer. Screening  You may have the following tests or  measurements: Height, weight, and BMI. Blood pressure. Lipid and cholesterol levels. These may be checked every 5 years, or more frequently if you are over 68 years old. Skin check. Lung cancer screening. You may have this screening every year starting at age 83 if you have a 30-pack-year history of smoking and currently smoke or have quit within the past 15 years. Fecal occult blood test (FOBT) of the stool. You may have this test every year starting at age 74. Flexible sigmoidoscopy or colonoscopy. You may have a sigmoidoscopy every 5 years or a colonoscopy every 10 years starting at age 69. Prostate cancer screening. Recommendations will vary depending on your family history and other risks. Hepatitis C blood test. Hepatitis B blood test. Sexually transmitted disease (STD) testing. Diabetes screening. This is done by checking your blood sugar (glucose) after you have not eaten for a while (fasting). You may have this done every 1-3 years. Abdominal aortic aneurysm (AAA) screening. You may need this if you are a current or former smoker. Osteoporosis. You may be screened starting at age 2 if you are at high risk. Talk with your health care provider about your test results, treatment options, and if necessary, the need for more tests. Vaccines  Your health care provider may recommend certain vaccines, such as: Influenza vaccine. This is recommended every year. Tetanus, diphtheria, and acellular pertussis (Tdap, Td) vaccine. You may need a Td booster every 10 years. Zoster vaccine. You may need this after age 67. Pneumococcal 13-valent conjugate (PCV13) vaccine. One dose is recommended after age 65. Pneumococcal polysaccharide (PPSV23) vaccine. One dose is recommended after age 83. Talk to your health care provider about which screenings and vaccines you need and how often you  need them. This information is not intended to replace advice given to you by your health care provider. Make sure  you discuss any questions you have with your health care provider. Document Released: 12/06/2015 Document Revised: 07/29/2016 Document Reviewed: 09/10/2015 Elsevier Interactive Patient Education  2017 [employer].  Fall Prevention in the Home Falls can cause injuries. They can happen to people of all ages. There are many things you can do to make your home safe and to help prevent falls. What can I do on the outside of my home? Regularly fix the edges of walkways and driveways and fix any cracks. Remove anything that might make you trip as you walk through a door, such as a raised step or threshold. Trim any bushes or trees on the path to your home. Use bright outdoor lighting. Clear any walking paths of anything that might make someone trip, such as rocks or tools. Regularly check to see if handrails are loose or broken. Make sure that both sides of any steps have handrails. Any raised decks and porches should have guardrails on the edges. Have any leaves, snow, or ice cleared regularly. Use sand or salt on walking paths during winter. Clean up any spills in your garage right away. This includes oil or grease spills. What can I do in the bathroom? Use night lights. Install grab bars by the toilet and in the tub and shower. Do not use towel bars as grab bars. Use non-skid mats or decals in the tub or shower. If you need to sit down in the shower, use a plastic, non-slip stool. Keep the floor dry. Clean up any water that spills on the floor as soon as it happens. Remove soap buildup in the tub or shower regularly. Attach bath mats securely with double-sided non-slip rug tape. Do not have throw rugs and other things on the floor that can make you trip. What can I do in the bedroom? Use night lights. Make sure that you have a light by your bed that is easy to reach. Do not use any sheets or blankets that are too big for your bed. They should not hang down onto the floor. Have a firm  chair that has side arms. You can use this for support while you get dressed. Do not have throw rugs and other things on the floor that can make you trip. What can I do in the kitchen? Clean up any spills right away. Avoid walking on wet floors. Keep items that you use a lot in easy-to-reach places. If you need to reach something above you, use a strong step stool that has a grab bar. Keep electrical cords out of the way. Do not use floor polish or wax that makes floors slippery. If you must use wax, use non-skid floor wax. Do not have throw rugs and other things on the floor that can make you trip. What can I do with my stairs? Do not leave any items on the stairs. Make sure that there are handrails on both sides of the stairs and use them. Fix handrails that are broken or loose. Make sure that handrails are as long as the stairways. Check any carpeting to make sure that it is firmly attached to the stairs. Fix any carpet that is loose or worn. Avoid having throw rugs at the top or bottom of the stairs. If you do have throw rugs, attach them to the floor with carpet tape. Make sure that you have a light switch  at the top of the stairs and the bottom of the stairs. If you do not have them, ask someone to add them for you. What else can I do to help prevent falls? Wear shoes that: Do not have high heels. Have rubber bottoms. Are comfortable and fit you well. Are closed at the toe. Do not wear sandals. If you use a stepladder: Make sure that it is fully opened. Do not climb a closed stepladder. Make sure that both sides of the stepladder are locked into place. Ask someone to hold it for you, if possible. Clearly mark and make sure that you can see: Any grab bars or handrails. First and last steps. Where the edge of each step is. Use tools that help you move around (mobility aids) if they are needed. These include: Canes. Walkers. Scooters. Crutches. Turn on the lights when you go  into a dark area. Replace any light bulbs as soon as they burn out. Set up your furniture so you have a clear path. Avoid moving your furniture around. If any of your floors are uneven, fix them. If there are any pets around you, be aware of where they are. Review your medicines with your doctor. Some medicines can make you feel dizzy. This can increase your chance of falling. Ask your doctor what other things that you can do to help prevent falls. This information is not intended to replace advice given to you by your health care provider. Make sure you discuss any questions you have with your health care provider. Document Released: 09/05/2009 Document Revised: 04/16/2016 Document Reviewed: 12/14/2014 Elsevier Interactive Patient Education  2017 [employer].

## 2023-03-31 ENCOUNTER — Other Ambulatory Visit: Payer: Self-pay | Admitting: Pharmacist

## 2023-03-31 NOTE — Progress Notes (Signed)
Bradley [name] was on the 2023 insurance-provided list for [facility] - having cardiovascular disease but not taking a statin.  Reviewed Epic refill history and looks like he filled atorvastatin 80mg  several times in 2023.  LRF was 03/202/2024 for 90 days.  Verified with [facility] that 2023 and 2024 Rx's for atorvastatin 80mg  were filled through [facility].  Unsure why patient is showing on [facility] list.

## 2023-04-06 ENCOUNTER — Other Ambulatory Visit: Payer: Self-pay | Admitting: Cardiology

## 2023-04-07 ENCOUNTER — Ambulatory Visit (INDEPENDENT_AMBULATORY_CARE_PROVIDER_SITE_OTHER): Payer: PPO | Admitting: Family Medicine

## 2023-04-07 ENCOUNTER — Encounter: Payer: Self-pay | Admitting: Family Medicine

## 2023-04-07 VITALS — BP 108/68 | HR 60 | Temp 98.4°F | Resp 16 | Ht 71.0 in | Wt 184.0 lb

## 2023-04-07 DIAGNOSIS — M25562 Pain in left knee: Secondary | ICD-10-CM

## 2023-04-07 DIAGNOSIS — M1612 Unilateral primary osteoarthritis, left hip: Secondary | ICD-10-CM | POA: Diagnosis not present

## 2023-04-07 DIAGNOSIS — M25552 Pain in left hip: Secondary | ICD-10-CM | POA: Diagnosis not present

## 2023-04-07 NOTE — Patient Instructions (Signed)
Thanks for coming in today.  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  I will refer you to [facility] for likely ultrasound, can discuss injection versus physical therapy versus need for further imaging or orthopedic eval.  No med changes for now.  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  That, knee will cause pain behind the kneecap, especially with prolonged sitting.  See information below.  Let me know if there are questions.  Patellofemoral Pain Syndrome  Patellofemoral pain syndrome is a condition in which the tissue (cartilage) on the underside of the kneecap (patella) softens or breaks down. This causes pain in the front of the knee. The condition is also called runner's knee or chondromalacia patella. Patellofemoral pain syndrome is most common in young adults who are active in sports. The knee is the largest joint in the body. The patella covers the front of the knee and is attached to muscles above and below the knee. The underside of the patella is covered with a smooth type of cartilage (synovium). The smooth surface helps the patella glide easily when you move your knee. Patellofemoral pain syndrome causes swelling in the joint linings and bone surfaces in the knee. What are the causes? This condition may be caused by: Overuse of the knee. Poor alignment of your knee joints. Weak leg muscles. A direct hit to your kneecap. What increases the risk? You are more likely to develop this condition if: You do a lot of activities that can wear down your kneecap. These include: Running. Squatting. Climbing stairs. You start a new physical activity or exercise program. You wear shoes that do not fit well. You do not have good leg strength. You are overweight. What are the signs or symptoms? The main symptom of this condition is knee pain. This may feel like a dull, aching pain underneath your patella, in the front of your  knee. There may be a popping or cracking sound when you move your knee. Pain may get worse with: Exercise. Climbing stairs. Running. Jumping. Squatting. Kneeling. Sitting for a long time. Moving or pushing on your patella. How is this diagnosed? This condition may be diagnosed based on: Your symptoms and medical history. You may be asked about your recent physical activities and which ones cause knee pain. A physical exam. This may include: Moving your patella back and forth. Checking your range of knee motion. Having you squat or jump to see if you have pain. Checking the strength of your leg muscles. Imaging tests to confirm the diagnosis. These may include an MRI of your knee. How is this treated? This condition may be treated at home with rest, ice, compression, and elevation (RICE).  Other treatments may include: NSAIDs, such as ibuprofen. Physical therapy to stretch and strengthen your leg muscles. Shoe inserts (orthotics) to take stress off your knee. A knee brace or knee support. Adhesive tapes to the skin. Surgery to remove damaged cartilage or move the patella to a better position. This is rare. Follow these instructions at home: If you have a brace: Wear the brace as told by your health care provider. Remove it only as told by your health care provider. Loosen the brace if your toes tingle, become numb, or turn cold and blue. Keep the brace clean. If the brace is not waterproof: Do not let it get wet. Cover it with a watertight covering when you take a bath or a  shower. Managing pain, stiffness, and swelling  If directed, put ice on the painful area. To do this: If you have a removable brace, remove it as told by your health care provider. Put ice in a plastic bag. Place a towel between your skin and the bag. Leave the ice on for 20 minutes, 2-3 times a day. Remove the ice if your skin turns bright red. This is very important. If you cannot feel pain, heat, or  cold, you have a greater risk of damage to the area. Move your toes often to reduce stiffness and swelling. Raise (elevate) the injured area above the level of your heart while you are sitting or lying down. Activity Rest your knee. Avoid activities that cause knee pain. Perform stretching and strengthening exercises as told by your health care provider or physical therapist. Return to your normal activities as told by your health care provider. Ask your health care provider what activities are safe for you. General instructions Take over-the-counter and prescription medicines only as told by your health care provider. Use splints, braces, knee supports, or walking aids as directed by your health care provider. Do not use any products that contain nicotine or tobacco, such as cigarettes, e-cigarettes, and chewing tobacco. These can delay healing. If you need help quitting, ask your health care provider. Keep all follow-up visits. This is important. Contact a health care provider if: Your symptoms get worse. You are not improving with home care. Summary Patellofemoral pain syndrome is a condition in which the tissue (cartilage) on the underside of the kneecap (patella) softens or breaks down. This condition causes swelling in the joint linings and bone surfaces in the knee. This leads to pain in the front of the knee. This condition may be treated at home with rest, ice, compression, and elevation (RICE). Use splints, braces, knee supports, or walking aids as directed by your health care provider. This information is not intended to replace advice given to you by your health care provider. Make sure you discuss any questions you have with your health care provider. Document Revised: 04/24/2020 Document Reviewed: 04/24/2020 Else[employer] Patient Education  2023 [employer]   Hip Pain The hip is the joint between the upper legs and the lower pelvis. The bones, cartilage, tendons, and muscles of  your hip joint support your body and allow you to move around. Hip pain can range from a minor ache to severe pain in one or both of your hips. The pain may be felt on the inside of the hip joint near the groin, or on the outside near the buttocks and upper thigh. You may also have swelling or stiffness in your hip area. Follow these instructions at home: Managing pain, stiffness, and swelling     If told, put ice on the painful area. Put ice in a plastic bag. Place a towel between your skin and the bag. Leave the ice on for 20 minutes, 2-3 times a day. If told, apply heat to the affected area as often as told by your health care provider. Use the heat source that your provider recommends, such as a moist heat pack or a heating pad. Place a towel between your skin and the heat source. Leave the heat on for 20-30 minutes. If your skin turns bright red, remove the ice or heat right away to prevent skin damage. The risk of damage is higher if you cannot feel pain, heat, or cold. Activity Do exercises as told by your provider. Avoid  activities that cause pain. General instructions  Take over-the-counter and prescription medicines only as told by your provider. Keep a journal of your symptoms. Write down: How often you have hip pain. The location of your pain. What the pain feels like. What makes the pain worse. Sleep with a pillow between your legs on your most comfortable side. Keep all follow-up visits. Your provider will monitor your pain and activity. Contact a health care provider if: You cannot put weight on your leg. Your pain or swelling gets worse after a week. It gets harder to walk. You have a fever. Get help right away if: You fall. You have a sudden increase in pain and swelling in your hip. Your hip is red or swollen or very tender to touch. This information is not intended to replace advice given to you by your health care provider. Make sure you discuss any questions  you have with your health care provider. Document Revised: 07/14/2022 Document Reviewed: 07/14/2022 [employer] Patient Education  2023 [employer].

## 2023-04-07 NOTE — Progress Notes (Signed)
Subjective:  Patient ID: Bradley [name], male    DOB: [birth year]  Age: 67 y.o. MRN: [id number]  CC:  Chief Complaint  Patient presents with   Hip Pain    Left hip, x2 months, Pt states he has been seen for this before last year. No falls or injuries. Pt states no popping and has pain with walking but not with riding his bike.    HPI [name] presents for   L hip pain: Past 2-3 months. Indolent onset, no specific injury. Similar as last year, but more in groin area, less on outside of hip as in past.  Pain with trying to carrying objects. No pain on bicycle/riding, but sore to swing leg over bicycle or getting into car. Recent trip to [place], [place]. Sore after prolonged standing, or more walking in [place] - used hiking stick to help at times.  Tx: 2 alleve per day. Minimal change.   Left hip pain discussed in July 2023, off and on for the previous 6 months without preceding injury.  At that time had some discomfort primarily in the lateral hip with full hip flexion, negative FABER but lateral discomfort with FADIR, did report discomfort at trochanteric bursa but nontender on exam at that time.  Treated with IT band stretches, handout on trochanteric bursitis, x-ray 06/09/2022, indicating osteoarthritis of left hip with joint space narrowing and small acetabular osteophytes.  He was treated with Voltaren or Aleve, home exercise program and update few weeks later indicated some improvement in symptoms.  Of note he has not had surgery of the left hip but did have surgery right hip previously - approx 20 yrs ago.   Some pain in left knee at times with walking, usually in kneecap, behind kneecap. Pain past 2 months, no known injury. Sore with prolonged sitting, long car rides. NKI.  History Patient Active Problem List   Diagnosis Date Noted   Coronary artery disease involving native coronary artery of native heart without angina pectoris 11/01/2019   Essential hypertension  11/01/2019   Educated about COVID-19 virus infection 11/01/2019   CAD S/P LAD DES 08/14/2015   Dyslipidemia 08/14/2015   Acute ST elevation myocardial infarction (STEMI) involving left anterior descending coronary artery (HCC) 08/02/2015   Cardiomyopathy, ischemic:  08/02/2015   Family history of early CAD 05/30/2015   Right inguinal hernia 10/09/2013   ARTHRITIS, RIGHT HIP 04/29/2009   Pain in limb 08/30/2007   UNEQUAL LEG LENGTH, ACQUIRED 08/30/2007   Past Medical History:  Diagnosis Date   CAD in native artery    LV dysfunction    EF post MI 35-40%   STEMI (ST elevation myocardial infarction) (HCC)    ant wall,  Xience alpine to LAD     Past Surgical History:  Procedure Laterality Date   CARDIAC CATHETERIZATION N/A 08/01/2015   Procedure: Left Heart Cath and Coronary Angiography;  Surgeon: Marykay Lex, MD;  Location: [facility] INVASIVE CV LAB;  Service: Cardiovascular;  Laterality: N/A;   CARDIAC CATHETERIZATION N/A 08/01/2015   Procedure: Coronary Stent Intervention;  Surgeon: Marykay Lex, MD;  Location: [facility] INVASIVE CV LAB;  Service: Cardiovascular;  Laterality: N/A;   HERNIA REPAIR     INGUINAL HERNIA REPAIR Right 10/23/2013   Procedure: HERNIA REPAIR INGUINAL ADULT;  Surgeon: Shelly Rubenstein, MD;  Location: [facility] OR;  Service: General;  Laterality: Right;   INGUINAL HERNIA REPAIR Left 12/24/2016   Procedure: LEFT INGUINAL HERNIA REPAIR;  Surgeon: Abigail Miyamoto, MD;  Location:  [facility] OR;  Service: General;  Laterality: Left;   INSERTION OF MESH Right 10/23/2013   Procedure: INSERTION OF MESH;  Surgeon: Shelly Rubenstein, MD;  Location: [facility] OR;  Service: General;  Laterality: Right;   INSERTION OF MESH Left 12/24/2016   Procedure: INSERTION OF MESH;  Surgeon: Abigail Miyamoto, MD;  Location: [facility] OR;  Service: General;  Laterality: Left;   JOINT REPLACEMENT Right    hip resurfacing   REPAIR ANKLE LIGAMENT     TONSILLECTOMY     Allergies  Allergen Reactions   No Known Allergies     Prior to Admission medications   Medication Sig Start Date End Date Taking? Authorizing Provider  aspirin EC 81 MG tablet Take 81 mg by mouth daily.   Yes [provider]  atorvastatin (LIPITOR) 80 MG tablet TAKE 1 TABLET(80 MG) BY MOUTH DAILY AT 6 PM 08/10/22  Yes Rollene Rotunda, MD  Coenzyme Q10 (CO Q-10) 100 MG CAPS Take 100 mg by mouth 2 (two) times daily.   Yes [provider]  lisinopril (ZESTRIL) 2.5 MG tablet TAKE 1 TABLET(2.5 MG) BY MOUTH DAILY 08/10/22  Yes Hochrein, Fayrene Fearing, MD  metoprolol succinate (TOPROL-XL) 25 MG 24 hr tablet TAKE 1/2 TABLET(12.5 MG) BY MOUTH DAILY 04/06/23  Yes Hochrein, Fayrene Fearing, MD  nitroGLYCERIN (NITROSTAT) 0.4 MG SL tablet DISSOLVE 1 TABLET UNDER THE TONGUE UNDER THE TONGUE EVERY 5 MINUTES AS NEEDED FOR CHEST PAIN 05/29/22  Yes Rollene Rotunda, MD   Social History   Socioeconomic History   Marital status: Married    Spouse name: Not on file   Number of children: 4   Years of education: Not on file   Highest education level: Bachelor's degree (e.g., BA, AB, BS)  Occupational History   Occupation: Radiation protection practitioner  Tobacco Use   Smoking status: Never   Smokeless tobacco: Never  Vaping Use   Vaping Use: Never used  Substance and Sexual Activity   Alcohol use: Yes    Alcohol/week: 2.0 - 3.0 standard drinks of alcohol    Types: 2 - 3 Cans of beer per week    Comment: occasion   Drug use: No   Sexual activity: Not on file  Other Topics Concern   Not on file  Social History Narrative   Lives at home with wife.    Social Determinants of Health   Financial Resource Strain: Low Risk  (04/06/2023)   Overall Financial Resource Strain (CARDIA)    Difficulty of Paying Living Expenses: Not hard at all  Food Insecurity: No Food Insecurity (04/06/2023)   Hunger Vital Sign    Worried About Running Out of Food in the Last Year: Never true    Ran Out of Food in the Last Year: Never true  Transportation Needs: No Transportation Needs  (04/06/2023)   PRAPARE - Administrator, Civil Service (Medical): No    Lack of Transportation (Non-Medical): No  Physical Activity: Insufficiently Active (04/06/2023)   Exercise Vital Sign    Days of Exercise per Week: 3 days    Minutes of Exercise per Session: 20 min  Stress: No Stress Concern Present (04/06/2023)   [employer] of Occupational Health - Occupational Stress Questionnaire    Feeling of Stress : Only a little  Social Connections: Socially Integrated (04/06/2023)   Social Connection and Isolation Panel [NHANES]    Frequency of Communication with Friends and Family: More than three times a week    Frequency of Social Gatherings with Friends and  Family: Three times a week    Attends Religious Services: More than 4 times per year    Active Member of Clubs or Organizations: Yes    Attends Banker Meetings: More than 4 times per year    Marital Status: Married  Catering manager Violence: Not At Risk (03/10/2023)   Humiliation, Afraid, Rape, and Kick questionnaire    Fear of Current or Ex-Partner: No    Emotionally Abused: No    Physically Abused: No    Sexually Abused: No    Review of Systems Per HPI.   Objective:   Vitals:   04/07/23 1103  BP: 108/68  Pulse: 60  Resp: 16  Temp: 98.4 F (36.9 C)  TempSrc: Temporal  SpO2: 99%  Weight: 184 lb (83.5 kg)  Height: 5\' 11"  (1.803 m)     Physical Exam Constitutional:      General: He is not in acute distress.    Appearance: Normal appearance. He is well-developed.  HENT:     Head: Normocephalic and atraumatic.  Cardiovascular:     Rate and Rhythm: Normal rate.  Pulmonary:     Effort: Pulmonary effort is normal.  Musculoskeletal:     Comments: Lumbar spine, no midline bony tenderness, pain-free flexion, extension, lateral flexion, and does not radiate to hip.  Did have some discomfort anterior hip with terminal extension/flexion of back.  Negative seated straight leg raise.  Left  hip, no focal bony tenderness, trochanteric bursa nontender.  Pain at terminal flexion, pain with internal rotation which was limited, greater than external rotation.  Pain with FADIR greater than FABER.  Left knee, skin intact, no erythema, no effusion.  No bony tenderness.  Full range of motion.  Negative McMurray.  Minimal discomfort with Clark compression testing of patella.  No appreciable crepitus.  Ambulating without assistive devices.  Decent VMO bulk on the left.  Neurological:     Mental Status: He is alert and oriented to person, place, and time.  Psychiatric:        Mood and Affect: Mood normal.        Assessment & Plan:  LEMARION WALSINGHAM is a 67 y.o. male . Left hip pain - Plan: Ambulatory referral to Sports Medicine Osteoarthritis of left hip, unspecified osteoarthritis type - Plan: Ambulatory referral to Sports Medicine  -Flare of left hip pain past few months.  No known inciting injury or change in activity.  Suspected flare of OA, may have component of femoral acetabular impingement.  Minimal relief with Aleve.  Without recent trauma will hold on repeat imaging, but referred to [facility] (prior patient) for likely ultrasound, consideration of injection versus PT versus advanced imaging.  Activity modification discussed in the interim, Aleve okay temporarily for now.  Patellofemoral arthralgia of left knee - Plan: Ambulatory referral to Sports Medicine  -Symptoms, history consistent with patellofemoral syndrome.  May be in part due to adjustment of gait with hip pain.  Handout given, will be seeing sports medicine as above, consider bracing but discussed VMO strengthening, RTC precautions.  No orders of the defined types were placed in this encounter.  Patient Instructions  Thanks for coming in today.  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  I will refer you to [facility] for likely ultrasound, can discuss injection versus  physical therapy versus need for further imaging or orthopedic eval.  No med changes for now.  Left knee pain  appears to be patellofemoral pain syndrome.  That, knee will cause pain behind the kneecap, especially with prolonged sitting.  See information below.  Let me know if there are questions.  Patellofemoral Pain Syndrome  Patellofemoral pain syndrome is a condition in which the tissue (cartilage) on the underside of the kneecap (patella) softens or breaks down. This causes pain in the front of the knee. The condition is also called runner's knee or chondromalacia patella. Patellofemoral pain syndrome is most common in young adults who are active in sports. The knee is the largest joint in the body. The patella covers the front of the knee and is attached to muscles above and below the knee. The underside of the patella is covered with a smooth type of cartilage (synovium). The smooth surface helps the patella glide easily when you move your knee. Patellofemoral pain syndrome causes swelling in the joint linings and bone surfaces in the knee. What are the causes? This condition may be caused by: Overuse of the knee. Poor alignment of your knee joints. Weak leg muscles. A direct hit to your kneecap. What increases the risk? You are more likely to develop this condition if: You do a lot of activities that can wear down your kneecap. These include: Running. Squatting. Climbing stairs. You start a new physical activity or exercise program. You wear shoes that do not fit well. You do not have good leg strength. You are overweight. What are the signs or symptoms? The main symptom of this condition is knee pain. This may feel like a dull, aching pain underneath your patella, in the front of your knee. There may be a popping or cracking sound when you move your knee. Pain may get worse with: Exercise. Climbing stairs. Running. Jumping. Squatting. Kneeling. Sitting for a long  time. Moving or pushing on your patella. How is this diagnosed? This condition may be diagnosed based on: Your symptoms and medical history. You may be asked about your recent physical activities and which ones cause knee pain. A physical exam. This may include: Moving your patella back and forth. Checking your range of knee motion. Having you squat or jump to see if you have pain. Checking the strength of your leg muscles. Imaging tests to confirm the diagnosis. These may include an MRI of your knee. How is this treated? This condition may be treated at home with rest, ice, compression, and elevation (RICE).  Other treatments may include: NSAIDs, such as ibuprofen. Physical therapy to stretch and strengthen your leg muscles. Shoe inserts (orthotics) to take stress off your knee. A knee brace or knee support. Adhesive tapes to the skin. Surgery to remove damaged cartilage or move the patella to a better position. This is rare. Follow these instructions at home: If you have a brace: Wear the brace as told by your health care provider. Remove it only as told by your health care provider. Loosen the brace if your toes tingle, become numb, or turn cold and blue. Keep the brace clean. If the brace is not waterproof: Do not let it get wet. Cover it with a watertight covering when you take a bath or a shower. Managing pain, stiffness, and swelling  If directed, put ice on the painful area. To do this: If you have a removable brace, remove it as told by your health care provider. Put ice in a plastic bag. Place a towel between your skin and the bag. Leave the ice on for 20 minutes, 2-3 times a  day. Remove the ice if your skin turns bright red. This is very important. If you cannot feel pain, heat, or cold, you have a greater risk of damage to the area. Move your toes often to reduce stiffness and swelling. Raise (elevate) the injured area above the level of your heart while you are  sitting or lying down. Activity Rest your knee. Avoid activities that cause knee pain. Perform stretching and strengthening exercises as told by your health care provider or physical therapist. Return to your normal activities as told by your health care provider. Ask your health care provider what activities are safe for you. General instructions Take over-the-counter and prescription medicines only as told by your health care provider. Use splints, braces, knee supports, or walking aids as directed by your health care provider. Do not use any products that contain nicotine or tobacco, such as cigarettes, e-cigarettes, and chewing tobacco. These can delay healing. If you need help quitting, ask your health care provider. Keep all follow-up visits. This is important. Contact a health care provider if: Your symptoms get worse. You are not improving with home care. Summary Patellofemoral pain syndrome is a condition in which the tissue (cartilage) on the underside of the kneecap (patella) softens or breaks down. This condition causes swelling in the joint linings and bone surfaces in the knee. This leads to pain in the front of the knee. This condition may be treated at home with rest, ice, compression, and elevation (RICE). Use splints, braces, knee supports, or walking aids as directed by your health care provider. This information is not intended to replace advice given to you by your health care provider. Make sure you discuss any questions you have with your health care provider. Document Revised: 04/24/2020 Document Reviewed: 04/24/2020 Elsevier Patient Education  2023 Elsevier Inc.   Hip Pain The hip is the joint between the upper legs and the lower pelvis. The bones, cartilage, tendons, and muscles of your hip joint support your body and allow you to move around. Hip pain can range from a minor ache to severe pain in one or both of your hips. The pain may be felt on the inside of the  hip joint near the groin, or on the outside near the buttocks and upper thigh. You may also have swelling or stiffness in your hip area. Follow these instructions at home: Managing pain, stiffness, and swelling     If told, put ice on the painful area. Put ice in a plastic bag. Place a towel between your skin and the bag. Leave the ice on for 20 minutes, 2-3 times a day. If told, apply heat to the affected area as often as told by your health care provider. Use the heat source that your provider recommends, such as a moist heat pack or a heating pad. Place a towel between your skin and the heat source. Leave the heat on for 20-30 minutes. If your skin turns bright red, remove the ice or heat right away to prevent skin damage. The risk of damage is higher if you cannot feel pain, heat, or cold. Activity Do exercises as told by your provider. Avoid activities that cause pain. General instructions  Take over-the-counter and prescription medicines only as told by your provider. Keep a journal of your symptoms. Write down: How often you have hip pain. The location of your pain. What the pain feels like. What makes the pain worse. Sleep with a pillow between your legs on your most comfortable  side. Keep all follow-up visits. Your provider will monitor your pain and activity. Contact a health care provider if: You cannot put weight on your leg. Your pain or swelling gets worse after a week. It gets harder to walk. You have a fever. Get help right away if: You fall. You have a sudden increase in pain and swelling in your hip. Your hip is red or swollen or very tender to touch. This information is not intended to replace advice given to you by your health care provider. Make sure you discuss any questions you have with your health care provider. Document Revised: 07/14/2022 Document Reviewed: 07/14/2022 Elsevier Patient Education  2023 Elsevier Inc.     Signed,   Meredith Staggers,  MD [facility], [facility] 04/07/23 12:09 PM

## 2023-04-14 ENCOUNTER — Ambulatory Visit: Payer: PPO | Admitting: Family Medicine

## 2023-04-14 VITALS — BP 126/80 | Ht 71.0 in | Wt 176.0 lb

## 2023-04-14 DIAGNOSIS — M1612 Unilateral primary osteoarthritis, left hip: Secondary | ICD-10-CM | POA: Diagnosis not present

## 2023-04-14 NOTE — Progress Notes (Signed)
   Established Patient Office Visit  Subjective   Patient ID: Bradley [name], male    DOB: [birth year]  Age: 67 y.o. MRN: [id number]  Left hip pain.  [name] is here today with chief complaint of left hip pain, referral from Dr. Neva [name].  He states he has had some left hip pain for approximately a year with been worsening over the past month.  This pain keeps him from long walks and brisk walks with his wife secondary to pain.  Most of his pain is located in the groin area of his left hip.  He has had some lateral hip pain that resolved with home exercises.  He has difficulty getting comfortable at night and this pain wakes him occasionally and causing him to be situated in bed frequently.  He started some oral medications only temporary relief.  He gets no pain with biking but has some difficulty getting his leg up and over the bike [name].  He has a history of right hip resurfacing.   ROS as listed above in HPI    Objective:     BP 126/80   Ht 5\' 11"  (1.803 m)   Wt 176 lb (79.8 kg)   BMI 24.55 kg/m   Physical Exam Vitals reviewed.  Constitutional:      General: He is not in acute distress.    Appearance: Normal appearance. He is normal weight. He is not ill-appearing, toxic-appearing or diaphoretic.  Pulmonary:     Effort: Pulmonary effort is normal.  Neurological:     Mental Status: He is alert.   Left hip: No obvious deformity or asymmetry.  Some slight tenderness to palpation over the lateral hip, greater trochanteric region and iliotibial band.  Hip flexion painful.  Decreased internal rotation about 8 degrees, external rotation about 30 degrees.  Negative seated straight leg raise testing.  Positive logroll test.  Positive FADIR.     Assessment & Plan:   Problem List Items Addressed This Visit       Musculoskeletal and Integument   Primary osteoarthritis of left hip - Primary    Patient here today with severe osteoarthritis of the left hip.  We discussed conservative  interventions including oral medications and physical therapy even to include corticosteroid injections.  He opted for surgical intervention.  Referral placed to orthopedic surgery.  Recommend he continue with physical activity as tolerated.  Follow-up as needed.      Relevant Orders   Ambulatory referral to Orthopedic Surgery    Return if symptoms worsen or fail to improve.    Claudie Leach, DO

## 2023-04-14 NOTE — Assessment & Plan Note (Signed)
Patient here today with severe osteoarthritis of the left hip.  We discussed conservative interventions including oral medications and physical therapy even to include corticosteroid injections.  He opted for surgical intervention.  Referral placed to orthopedic surgery.  Recommend he continue with physical activity as tolerated.  Follow-up as needed.

## 2023-04-14 NOTE — Patient Instructions (Signed)
Your pain is due to arthritis. We will refer you to Dr. Magnus Ivan to discuss risks/benefits, recovery of left hip replacement. These are the different medications you can take for this: Tylenol 500mg  1-2 tabs three times a day for pain. Capsaicin, aspercreme, or biofreeze topically up to four times a day may also help with pain. Some supplements that may help for arthritis: Boswellia extract, curcumin, pycnogenol Aleve 1-2 tabs twice a day with food Cortisone injections are an option. It's important that you continue to stay active. Consider physical therapy to strengthen muscles around the joint that hurts to take pressure off of the joint itself. Shoe inserts with good arch support may be helpful. Heat or ice 15 minutes at a time 3-4 times a day as needed to help with pain. Water aerobics and cycling with low resistance are the best two types of exercise for arthritis though any exercise is ok as long as it doesn't worsen the pain.

## 2023-05-05 ENCOUNTER — Ambulatory Visit (INDEPENDENT_AMBULATORY_CARE_PROVIDER_SITE_OTHER): Payer: PPO | Admitting: Orthopaedic Surgery

## 2023-05-05 ENCOUNTER — Other Ambulatory Visit (INDEPENDENT_AMBULATORY_CARE_PROVIDER_SITE_OTHER): Payer: PPO

## 2023-05-05 VITALS — Ht 71.0 in | Wt 176.0 lb

## 2023-05-05 DIAGNOSIS — M1612 Unilateral primary osteoarthritis, left hip: Secondary | ICD-10-CM | POA: Diagnosis not present

## 2023-05-05 NOTE — Progress Notes (Signed)
The patient is a very healthy and active 67 year old gentleman who has been dealing with worsening left hip pain for several years now.  11 months ago he did have x-rays of his pelvis and his left hip showing severe end-stage arthritis of the left hip.  We did rex-ray the hip today and the arthritis has gotten significantly worse.  His x-rays today show bone-on-bone wear and complete loss of joint space of the left hip.  There is significant sclerotic changes and osteophytes as well.  He does have a [place] hip resurfacing arthroplasty on the right hip that has been done over 20 years ago and that is still doing very well.  He is a thin 67 year old gentleman.  He is not diabetic.  He is not on blood thinning medication.  I was able to review all of his medications and past medical history with an epic.  He currently denies any headache, chest pain, shortness of breath, fever, chills, nausea, vomiting.  On exam his left hip is significantly stiff with significant limitations in range of motion.  There is pain in the groin as well.  This is radiating down to his knee and he does wear knee brace on the left side.  His right hip resurfacing side has smooth range of motion with no blocks to rotation.  We had a long and thorough discussion about hip replacement surgery.  I am recommending a direct anterior total hip arthroplasty based on his clinical exam and x-ray findings and the failure conservative treatment for over a year now.  We discussed what the surgery involves.  I gave him a handout about hip replacement surgery and we discussed the risks and benefits of the surgery and what to expect from an operative and postoperative standpoint.  All question concerns were answered and addressed.  We will work on getting him scheduled.

## 2023-06-14 ENCOUNTER — Encounter (HOSPITAL_COMMUNITY): Payer: Self-pay

## 2023-06-14 NOTE — Patient Instructions (Signed)
SURGICAL WAITING ROOM VISITATION  Patients having surgery or a procedure may have no more than 2 support people in the waiting area - these visitors may rotate.    Children under the age of 23 must have an adult with them who is not the patient.   If the patient needs to stay at the hospital during part of their recovery, the visitor guidelines for inpatient rooms apply. Pre-op nurse will coordinate an appropriate time for 1 support person to accompany patient in pre-op.  This support person may not rotate.    Please refer to the [facility] website for the visitor guidelines for Inpatients (after your surgery is over and you are in a regular room).       Your procedure is scheduled on: 06-25-23    Report to [facility] Main Entrance    Report to admitting at       0900  AM   Call this number if you have problems the morning of surgery [phone]   Do not eat food :After Midnight.   After Midnight you may have the following liquids until __0830____ AM  DAY OF SURGERY  then nothing by mouth  Water Non-Citrus Juices (without pulp, NO RED-Apple, White grape, White cranberry) Black Coffee (NO MILK/CREAM OR CREAMERS, sugar ok)  Clear Tea (NO MILK/CREAM OR CREAMERS, sugar ok) regular and decaf                             Plain Jell-O (NO RED)                                           Fruit ices (not with fruit pulp, NO RED)                                     Popsicles (NO RED)                                                               Sports drinks like Gatorade (NO RED)                    The day of surgery:  Drink ONE (1) Pre-Surgery Clear Ensure at      0815 AM the morning of surgery. Drink in one sitting. Do not sip.  This drink was given to you during your hospital  pre-op appointment visit. Nothing else to drink after completing the  Pre-Surgery Clear Ensure by 0830 am .          If you have questions, please contact your surgeon's office.   FOLLOW ANY  ADDITIONAL PRE OP INSTRUCTIONS YOU RECEIVED FROM YOUR SURGEON'S OFFICE!!!     Oral Hygiene is also important to reduce your risk of infection.                                    Remember - BRUSH YOUR TEETH THE MORNING OF SURGERY WITH YOUR REGULAR TOOTHPASTE  DENTURES  WILL BE REMOVED PRIOR TO SURGERY PLEASE DO NOT APPLY "Poly grip" OR ADHESIVES!!!   Do NOT smoke after Midnight   Take these medicines the morning of surgery with A SIP OF WATER: metoprolol   Bring CPAP mask and tubing day of surgery.                              You may not have any metal on your body including hair pins, jewelry, and body piercing             Do not wear  lotions, powders, perfumes/cologne, or deodorant              Men may shave face and neck.   Do not bring valuables to the hospital. [facility] IS NOT             RESPONSIBLE   FOR VALUABLES.   Contacts, glasses, dentures or bridgework may not be worn into surgery.   Bring small overnight bag day of surgery.   DO NOT BRING YOUR HOME MEDICATIONS TO THE HOSPITAL. PHARMACY WILL DISPENSE MEDICATIONS LISTED ON YOUR MEDICATION LIST TO YOU DURING YOUR ADMISSION IN THE HOSPITAL!    Patients discharged on the day of surgery will not be allowed to drive home.  Someone NEEDS to stay with you for the first 24 hours after anesthesia.   Special Instructions: Bring a copy of your healthcare power of attorney and living will documents the day of surgery if you haven't scanned them before.              Please read over the following fact sheets you were given: IF YOU HAVE QUESTIONS ABOUT YOUR PRE-OP INSTRUCTIONS PLEASE CALL [phone]    If you test positive for Covid or have been in contact with anyone that has tested positive in the last 10 days please notify you surgeon.      Pre-operative 5 CHG Bath Instructions   You can play a key role in reducing the risk of infection after surgery. Your skin needs to be as free of germs as possible. You can reduce  the number of germs on your skin by washing with CHG (chlorhexidine gluconate) soap before surgery. CHG is an antiseptic soap that kills germs and continues to kill germs even after washing.   DO NOT use if you have an allergy to chlorhexidine/CHG or antibacterial soaps. If your skin becomes reddened or irritated, stop using the CHG and notify one of our RNs at [phone].   Please shower with the CHG soap starting 4 days before surgery using the following schedule:     Please keep in mind the following:  DO NOT shave, including legs and underarms, starting the day of your first shower.   You may shave your face at any point before/day of surgery.  Place clean sheets on your bed the day you start using CHG soap. Use a clean washcloth (not used since being washed) for each shower. DO NOT sleep with pets once you start using the CHG.   CHG Shower Instructions:  If you choose to wash your hair and private area, wash first with your normal shampoo/soap.  After you use shampoo/soap, rinse your hair and body thoroughly to remove shampoo/soap residue.  Turn the water OFF and apply about 3 tablespoons (45 ml) of CHG soap to a CLEAN washcloth.  Apply CHG soap ONLY FROM YOUR NECK DOWN TO YOUR TOES (washing  for 3-5 minutes)  DO NOT use CHG soap on face, private areas, open wounds, or sores.  Pay special attention to the area where your surgery is being performed.  If you are having back surgery, having someone wash your back for you may be helpful. Wait 2 minutes after CHG soap is applied, then you may rinse off the CHG soap.  Pat dry with a clean towel  Put on clean clothes/pajamas   If you choose to wear lotion, please use ONLY the CHG-compatible lotions on the back of this paper.     Additional instructions for the day of surgery: DO NOT APPLY any lotions, deodorants, cologne, or perfumes.   Put on clean/comfortable clothes.  Brush your teeth.  Ask your nurse before applying any  prescription medications to the skin.      CHG Compatible Lotions   Aveeno Moisturizing lotion  Cetaphil Moisturizing Cream  Cetaphil Moisturizing Lotion  Clairol Herbal Essence Moisturizing Lotion, Dry Skin  Clairol Herbal Essence Moisturizing Lotion, Extra Dry Skin  Clairol Herbal Essence Moisturizing Lotion, Normal Skin  Curel Age Defying Therapeutic Moisturizing Lotion with Alpha Hydroxy  Curel Extreme Care Body Lotion  Curel Soothing Hands Moisturizing Hand Lotion  Curel Therapeutic Moisturizing Cream, Fragrance-Free  Curel Therapeutic Moisturizing Lotion, Fragrance-Free  Curel Therapeutic Moisturizing Lotion, Original Formula  Eucerin Daily Replenishing Lotion  Eucerin Dry Skin Therapy Plus Alpha Hydroxy Crme  Eucerin Dry Skin Therapy Plus Alpha Hydroxy Lotion  Eucerin Original Crme  Eucerin Original Lotion  Eucerin Plus Crme Eucerin Plus Lotion  Eucerin TriLipid Replenishing Lotion  Keri Anti-Bacterial Hand Lotion  Keri Deep Conditioning Original Lotion Dry Skin Formula Softly Scented  Keri Deep Conditioning Original Lotion, Fragrance Free Sensitive Skin Formula  Keri Lotion Fast Absorbing Fragrance Free Sensitive Skin Formula  Keri Lotion Fast Absorbing Softly Scented Dry Skin Formula  Keri Original Lotion  Keri Skin Renewal Lotion Keri Silky Smooth Lotion  Keri Silky Smooth Sensitive Skin Lotion  Nivea Body Creamy Conditioning Oil  Nivea Body Extra Enriched Lotion  Nivea Body Original Lotion  Nivea Body Sheer Moisturizing Lotion Nivea Crme  Nivea Skin Firming Lotion  NutraDerm 30 Skin Lotion  NutraDerm Skin Lotion  NutraDerm Therapeutic Skin Cream  NutraDerm Therapeutic Skin Lotion  ProShield Protective Hand Cream  Provon moisturizing lotion   Incentive Spirometer  An incentive spirometer is a tool that can help keep your lungs clear and active. This tool measures how well you are filling your lungs with each breath. Taking long deep breaths may help  reverse or decrease the chance of developing breathing (pulmonary) problems (especially infection) following: A long period of time when you are unable to move or be active. BEFORE THE PROCEDURE  If the spirometer includes an indicator to show your best effort, your nurse or respiratory therapist will set it to a desired goal. If possible, sit up straight or lean slightly forward. Try not to slouch. Hold the incentive spirometer in an upright position. INSTRUCTIONS FOR USE  Sit on the edge of your bed if possible, or sit up as far as you can in bed or on a chair. Hold the incentive spirometer in an upright position. Breathe out normally. Place the mouthpiece in your mouth and seal your lips tightly around it. Breathe in slowly and as deeply as possible, raising the piston or the ball toward the top of the column. Hold your breath for 3-5 seconds or for as long as possible. Allow the piston or ball to fall to  the bottom of the column. Remove the mouthpiece from your mouth and breathe out normally. Rest for a few seconds and repeat Steps 1 through 7 at least 10 times every 1-2 hours when you are awake. Take your time and take a few normal breaths between deep breaths. The spirometer may include an indicator to show your best effort. Use the indicator as a goal to work toward during each repetition. After each set of 10 deep breaths, practice coughing to be sure your lungs are clear. If you have an incision (the cut made at the time of surgery), support your incision when coughing by placing a pillow or rolled up towels firmly against it. Once you are able to get out of bed, walk around indoors and cough well. You may stop using the incentive spirometer when instructed by your caregiver.  RISKS AND COMPLICATIONS Take your time so you do not get dizzy or light-headed. If you are in pain, you may need to take or ask for pain medication before doing incentive spirometry. It is harder to take a deep  breath if you are having pain. AFTER USE Rest and breathe slowly and easily. It can be helpful to keep track of a log of your progress. Your caregiver can provide you with a simple table to help with this. If you are using the spirometer at home, follow these instructions: SEEK MEDICAL CARE IF:  You are having difficultly using the spirometer. You have trouble using the spirometer as often as instructed. Your pain medication is not giving enough relief while using the spirometer. You develop fever of 100.5 F (38.1 C) or higher. SEEK IMMEDIATE MEDICAL CARE IF:  You cough up bloody sputum that had not been present before. You develop fever of 102 F (38.9 C) or greater. You develop worsening pain at or near the incision site. MAKE SURE YOU:  Understand these instructions. Will watch your condition. Will get help right away if you are not doing well or get worse. Document Released: 03/22/2007 Document Revised: 02/01/2012 Document Reviewed: 05/23/2007 ExitCare Patient Information 2014 Exit[employer], [place].   ________________________________________________________________________ WHAT IS A BLOOD TRANSFUSION? Blood Transfusion Information  A transfusion is the replacement of blood or some of its parts. Blood is made up of multiple cells which provide different functions. Red blood cells carry oxygen and are used for blood loss replacement. White blood cells fight against infection. Platelets control bleeding. Plasma helps clot blood. Other blood products are available for specialized needs, such as hemophilia or other clotting disorders. BEFORE THE TRANSFUSION  Who gives blood for transfusions?  Healthy volunteers who are fully evaluated to make sure their blood is safe. This is blood bank blood. Transfusion therapy is the safest it has ever been in the practice of medicine. Before blood is taken from a donor, a complete history is taken to make sure that person has no history of diseases  nor engages in risky social behavior (examples are intravenous drug use or sexual activity with multiple partners). The donor's travel history is screened to minimize risk of transmitting infections, such as malaria. The donated blood is tested for signs of infectious diseases, such as HIV and hepatitis. The blood is then tested to be sure it is compatible with you in order to minimize the chance of a transfusion reaction. If you or a relative donates blood, this is often done in anticipation of surgery and is not appropriate for emergency situations. It takes many days to process the donated blood. RISKS  AND COMPLICATIONS Although transfusion therapy is very safe and saves many lives, the main dangers of transfusion include:  Getting an infectious disease. Developing a transfusion reaction. This is an allergic reaction to something in the blood you were given. Every precaution is taken to prevent this. The decision to have a blood transfusion has been considered carefully by your caregiver before blood is given. Blood is not given unless the benefits outweigh the risks. AFTER THE TRANSFUSION Right after receiving a blood transfusion, you will usually feel much better and more energetic. This is especially true if your red blood cells have gotten low (anemic). The transfusion raises the level of the red blood cells which carry oxygen, and this usually causes an energy increase. The nurse administering the transfusion will monitor you carefully for complications. HOME CARE INSTRUCTIONS  No special instructions are needed after a transfusion. You may find your energy is better. Speak with your caregiver about any limitations on activity for underlying diseases you may have. SEEK MEDICAL CARE IF:  Your condition is not improving after your transfusion. You develop redness or irritation at the intravenous (IV) site. SEEK IMMEDIATE MEDICAL CARE IF:  Any of the following symptoms occur over the next 12  hours: Shaking chills. You have a temperature by mouth above 102 F (38.9 C), not controlled by medicine. Chest, back, or muscle pain. People around you feel you are not acting correctly or are confused. Shortness of breath or difficulty breathing. Dizziness and fainting. You get a rash or develop hives. You have a decrease in urine output. Your urine turns a dark color or changes to pink, red, or brown. Any of the following symptoms occur over the next 10 days: You have a temperature by mouth above 102 F (38.9 C), not controlled by medicine. Shortness of breath. Weakness after normal activity. The white part of the eye turns yellow (jaundice). You have a decrease in the amount of urine or are urinating less often. Your urine turns a dark color or changes to pink, red, or brown. Document Released: 11/06/2000 Document Revised: 02/01/2012 Document Reviewed: 06/25/2008 [facility] Patient Information 2014 [place], [place].  _______________________________________________________________________

## 2023-06-14 NOTE — Progress Notes (Signed)
PCP - Pearline Cables LOV 11-09-22 Cardiologist - Rollene Rotunda, MD LOV 05-29-22   PPM/ICD -  Device Orders -  Rep Notified -   Chest x-ray -  EKG - 06-16-23  Stress Test -  ECHO - 2022 Cardiac Cath - 2016  Sleep Study -  CPAP -   Fasting Blood Sugar -  Checks Blood Sugar _____ times a day  Blood Thinner Instructions: Aspirin Instructions: 81 mg  No instructions on stopping   ERAS Protcol - PRE-SURGERY Ensure    COVID vaccine -yes  Activity--Able to exercise and complete ADL's without SOB or CP Anesthesia review: CAD ,MI 2016, Stent, HTN, LOV Cardiology 05-29-22  Patient denies shortness of breath, fever, cough and chest pain at PAT appointment   All instructions explained to the patient, with a verbal understanding of the material. Patient agrees to go over the instructions while at home for a better understanding. Patient also instructed to self quarantine after being tested for COVID-19. The opportunity to ask questions was provided.

## 2023-06-16 ENCOUNTER — Encounter: Payer: Self-pay | Admitting: Orthopaedic Surgery

## 2023-06-16 ENCOUNTER — Encounter (HOSPITAL_COMMUNITY)
Admission: RE | Admit: 2023-06-16 | Discharge: 2023-06-16 | Disposition: A | Discharge status: Home / Self Care | Payer: PPO | Source: Ambulatory Visit | Attending: Orthopaedic Surgery | Admitting: Orthopaedic Surgery

## 2023-06-16 ENCOUNTER — Encounter (HOSPITAL_COMMUNITY): Payer: Self-pay

## 2023-06-16 ENCOUNTER — Other Ambulatory Visit: Payer: Self-pay

## 2023-06-16 VITALS — BP 119/77 | HR 58 | Temp 98.1°F | Resp 16 | Ht 71.0 in | Wt 174.0 lb

## 2023-06-16 DIAGNOSIS — M1612 Unilateral primary osteoarthritis, left hip: Secondary | ICD-10-CM | POA: Diagnosis not present

## 2023-06-16 DIAGNOSIS — I10 Essential (primary) hypertension: Secondary | ICD-10-CM | POA: Insufficient documentation

## 2023-06-16 DIAGNOSIS — Z01818 Encounter for other preprocedural examination: Secondary | ICD-10-CM | POA: Insufficient documentation

## 2023-06-16 DIAGNOSIS — I2510 Atherosclerotic heart disease of native coronary artery without angina pectoris: Secondary | ICD-10-CM | POA: Insufficient documentation

## 2023-06-16 DIAGNOSIS — Z951 Presence of aortocoronary bypass graft: Secondary | ICD-10-CM | POA: Diagnosis not present

## 2023-06-16 HISTORY — DX: Unspecified osteoarthritis, unspecified site: M19.90

## 2023-06-16 HISTORY — DX: Essential (primary) hypertension: I10

## 2023-06-16 LAB — CBC
HCT: 43.2 % (ref 39.0–52.0)
Hemoglobin: 14 g/dL (ref 13.0–17.0)
MCH: 33.3 pg (ref 26.0–34.0)
MCHC: 32.4 g/dL (ref 30.0–36.0)
MCV: 102.6 fL — ABNORMAL HIGH (ref 80.0–100.0)
Platelets: 179 10*3/uL (ref 150–400)
RBC: 4.21 MIL/uL — ABNORMAL LOW (ref 4.22–5.81)
RDW: 13.2 % (ref 11.5–15.5)
WBC: 6.4 10*3/uL (ref 4.0–10.5)
nRBC: 0 % (ref 0.0–0.2)

## 2023-06-16 LAB — COMPREHENSIVE METABOLIC PANEL
ALT: 28 U/L (ref 0–44)
AST: 26 U/L (ref 15–41)
Albumin: 4.2 g/dL (ref 3.5–5.0)
Alkaline Phosphatase: 36 U/L — ABNORMAL LOW (ref 38–126)
Anion gap: 7 (ref 5–15)
BUN: 15 mg/dL (ref 8–23)
CO2: 27 mmol/L (ref 22–32)
Calcium: 9.1 mg/dL (ref 8.9–10.3)
Chloride: 102 mmol/L (ref 98–111)
Creatinine, Ser: 1 mg/dL (ref 0.61–1.24)
GFR, Estimated: >60 mL/min (ref >60)
Glucose, Bld: 99 mg/dL (ref 70–99)
Potassium: 4 mmol/L (ref 3.5–5.1)
Sodium: 136 mmol/L (ref 135–145)
Total Bilirubin: 0.8 mg/dL (ref 0.3–1.2)
Total Protein: 6.4 g/dL — ABNORMAL LOW (ref 6.5–8.1)

## 2023-06-16 LAB — SURGICAL PCR SCREEN
MRSA, PCR: NEGATIVE
Staphylococcus aureus: NEGATIVE

## 2023-06-16 LAB — TYPE AND SCREEN
ABO/RH(D): A POS
Antibody Screen: NEGATIVE

## 2023-06-17 NOTE — Progress Notes (Addendum)
Anesthesia Chart Review   Case: [id number] Date/Time: 06/25/23 1115   Procedure: LEFT TOTAL HIP ARTHROPLASTY ANTERIOR APPROACH (Left: Hip)   Anesthesia type: Spinal   Pre-op diagnosis: osteoarthritis left hip   Location: [facility] ROOM 10 / [facility]   Surgeons: Kathryne Hitch, MD       DISCUSSION:67 y.o. never smoker with h/o HTN, CAD s/p stent to LAD 2016, left hip OA scheduled for above procedure 06/25/23 with Dr. Doneen Poisson.   Pt last seen by cardiology 05/30/2023. Stable at this visit with 1 year follow up.  Will request cardiology clearance.   Pt seen by cardiology 06/22/2023. Per OV note, "Mr. Matusik's perioperative risk of a major cardiac event is 0.9% according to the Revised Cardiac Risk Index (RCRI).  Therefore, he is at low risk for perioperative complications.   His functional capacity is good at 5.62 METs according to the Duke Activity Status Index (DASI). Recommendations: According to ACC/AHA guidelines, no further cardiovascular testing needed.  The patient may proceed to surgery at acceptable risk.   Antiplatelet and/or Anticoagulation Recommendations: Aspirin can be held for 5 days prior to his surgery.  Please resume Aspirin post operatively when it is felt to be safe from a bleeding standpoint."  VS: BP 119/77   Pulse (!) 58   Temp 36.7 C (Oral)   Resp 16   Ht 5\' 11"  (1.803 m)   Wt 78.9 kg   SpO2 100%   BMI 24.27 kg/m   PROVIDERS: Shade Flood, MD is PCP   Rollene Rotunda, MD is Cardiologist  LABS: Labs reviewed: Acceptable for surgery. (all labs ordered are listed, but only abnormal results are displayed)  Labs Reviewed  CBC - Abnormal; Notable for the following components:      Result Value   RBC 4.21 (*)    MCV 102.6 (*)    All other components within normal limits  COMPREHENSIVE METABOLIC PANEL - Abnormal; Notable for the following components:   Total Protein 6.4 (*)    Alkaline Phosphatase 36 (*)    All other components within normal  limits  SURGICAL PCR SCREEN  TYPE AND SCREEN     IMAGES:   EKG:   CV: Echo 04/22/2021  1. Left ventricular ejection fraction, by estimation, is 50 to 55%. Left  ventricular ejection fraction by PLAX is 53 %. The left ventricle has  normal function. The left ventricle has no regional wall motion  abnormalities. Left ventricular diastolic  parameters are consistent with Grade I diastolic dysfunction (impaired  relaxation).   2. Right ventricular systolic function is normal. The right ventricular  size is normal. There is normal pulmonary artery systolic pressure.   3. The mitral valve is abnormal. Trivial mitral valve regurgitation.   4. Aortic regurgitation is eccentric, located on the commisure between  the left and right coronary cusps. The aortic valve is tricuspid. Aortic  valve regurgitation is mild. Mild aortic valve sclerosis is present, with  no evidence of aortic valve  stenosis. Aortic regurgitation PHT measures 655 msec.   5. The inferior vena cava is normal in size with greater than 50%  respiratory variability, suggesting right atrial pressure of 3 mmHg.  Past Medical History:  Diagnosis Date   Arthritis    CAD in native artery    Hypertension    LV dysfunction    EF post MI 35-40%   STEMI (ST elevation myocardial infarction) (HCC)    ant wall,  Xience alpine to LAD  Past Surgical History:  Procedure Laterality Date   CARDIAC CATHETERIZATION N/A 08/01/2015   Procedure: Left Heart Cath and Coronary Angiography;  Surgeon: Marykay Lex, MD;  Location: [facility] INVASIVE CV LAB;  Service: Cardiovascular;  Laterality: N/A;   CARDIAC CATHETERIZATION N/A 08/01/2015   Procedure: Coronary Stent Intervention;  Surgeon: Marykay Lex, MD;  Location: [facility] INVASIVE CV LAB;  Service: Cardiovascular;  Laterality: N/A;   HERNIA REPAIR     HIP RESURFACING Right    INGUINAL HERNIA REPAIR Right 10/23/2013   Procedure: HERNIA REPAIR INGUINAL ADULT;  Surgeon: Shelly Rubenstein, MD;  Location: [facility] OR;  Service: General;  Laterality: Right;   INGUINAL HERNIA REPAIR Left 12/24/2016   Procedure: LEFT INGUINAL HERNIA REPAIR;  Surgeon: Abigail Miyamoto, MD;  Location: [facility] OR;  Service: General;  Laterality: Left;   INSERTION OF MESH Right 10/23/2013   Procedure: INSERTION OF MESH;  Surgeon: Shelly Rubenstein, MD;  Location: [facility] OR;  Service: General;  Laterality: Right;   INSERTION OF MESH Left 12/24/2016   Procedure: INSERTION OF MESH;  Surgeon: Abigail Miyamoto, MD;  Location: [facility] OR;  Service: General;  Laterality: Left;   REPAIR ANKLE LIGAMENT     TONSILLECTOMY      MEDICATIONS:  aspirin EC 81 MG tablet   atorvastatin (LIPITOR) 80 MG tablet   Coenzyme Q10 (CO Q-10) 100 MG CAPS   lisinopril (ZESTRIL) 2.5 MG tablet   metoprolol succinate (TOPROL-XL) 25 MG 24 hr tablet   naproxen sodium (ALEVE) 220 MG tablet   nitroGLYCERIN (NITROSTAT) 0.4 MG SL tablet   No current facility-administered medications for this encounter.    Jodell Cipro Ward, PA-C [facility]-Surgical Testing [phone]

## 2023-06-21 ENCOUNTER — Telehealth: Payer: Self-pay | Admitting: *Deleted

## 2023-06-21 NOTE — Telephone Encounter (Signed)
Pt call back at 4:31, s/w one of our schedulers and confirmed appt for tomorrow 06/22/23 @ 10:30 with Jari Favre, PAC. I will update all parties involved.

## 2023-06-21 NOTE — Telephone Encounter (Signed)
   Pre-operative Risk Assessment    Patient Name: Bradley [name]  DOB: 10-May-1956 MRN: [id number]      Request for Surgical Clearance    Procedure:   LEFT TOTAL HIP ARTHROPLASTY  Date of Surgery:  Clearance 06/25/23                                 Surgeon:  DR. Doneen Poisson Surgeon's Group or Practice Name:  [facility] Phone number:  [phone] Fax number:  [phone] ATTN: SHERRIE   Type of Clearance Requested:   - Medical ; ASA   Type of Anesthesia:  Spinal   Additional requests/questions:    [name]   06/21/2023, 10:45 AM

## 2023-06-21 NOTE — Telephone Encounter (Signed)
I left a message for the pt that we just got an opening tomorrow for in office appt for pre op clearance. Appt is with Jari Favre, [facility] 06/22/23 @ 10:30. I left message that I will hold this for him until 5 pm today but I need pt to call back ASAP and confirm or change the appt. I have also updated the surgery scheduler as well.

## 2023-06-21 NOTE — Progress Notes (Unsigned)
Cardiology Office Note:  .   Date:  06/22/2023  ID:  Bradley [name], DOB 09-27-1956, MRN [id number] PCP: Shade Flood, MD  [facility] Providers Cardiologist:  None {[place]  History of Present Illness: .   Bradley [name] is a 67 y.o. male with a past medical history of anterior MI in 2016 (coronary calcium score which put him around the 40th percentile with some mild calcium in the LAD), had a POET which was negative but ultimately presented with subsequent anterior MI.  Had stenting of proximal LAD.  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  Ejection fraction slightly reduced 45%.  Follow-up echo that showed EF 50 to 55%.  He presents today for preop clearance.  Was last seen 05/30/2019.  At that time was doing a lot of work at [employer].  Started riding his bike again after a little hiatus.  Might have to have left hip surgery.  No new symptoms of chest discomfort, neck or arm discomfort.  No new shortness of breath, PND, or orthopnea.  No reported palpitations, syncope, or presyncope.  Today, he tells me that he is getting a right hip replacement.  Left side was done a while back.  He has been doing great from a heart standpoint.  Biking a lot.  He also is the Production designer, theatre/television/film of [employer].  He has not had any chest pain, shortness of breath, lightheadedness or dizziness.  He was in [place] a month ago and did lots of walking at that time.  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  He is scored above a 4 METS on the DASI.  He can hold his aspirin x 5 days prior to procedure and restart medically safe to do so.  Reports no shortness of breath nor dyspnea on exertion. Reports no chest pain, pressure, or tightness. No edema, orthopnea, PND. Reports no palpitations.   ROS: Pertinent ROS in HPI  Studies Reviewed: .       Echocardiogram 04/22/2021 IMPRESSIONS     1. Left ventricular ejection fraction, by estimation, is 50 to 55%. Left   ventricular ejection fraction by PLAX is 53 %. The left ventricle has  normal function. The left ventricle has no regional wall motion  abnormalities. Left ventricular diastolic  parameters are consistent with Grade I diastolic dysfunction (impaired  relaxation).   2. Right ventricular systolic function is normal. The right ventricular  size is normal. There is normal pulmonary artery systolic pressure.   3. The mitral valve is abnormal. Trivial mitral valve regurgitation.   4. Aortic regurgitation is eccentric, located on the commisure between  the left and right coronary cusps. The aortic valve is tricuspid. Aortic  valve regurgitation is mild. Mild aortic valve sclerosis is present, with  no evidence of aortic valve  stenosis. Aortic regurgitation PHT measures 655 msec.   5. The inferior vena cava is normal in size with greater than 50%  respiratory variability, suggesting right atrial pressure of 3 mmHg.   Comparison(s): No significant change from prior study. 11/04/2015: LVEF  50-55%.   FINDINGS   Left Ventricle: Left ventricular ejection fraction, by estimation, is 50  to 55%. Left ventricular ejection fraction by PLAX is 53 %. The left  ventricle has normal function. The left ventricle has no regional wall  motion abnormalities. The left  ventricular internal cavity size was normal in size. There is no left  ventricular hypertrophy.  Left ventricular diastolic parameters are  consistent with Grade I diastolic dysfunction (impaired relaxation).  Indeterminate filling pressures.   Right Ventricle: The right ventricular size is normal. No increase in  right ventricular wall thickness. Right ventricular systolic function is  normal. There is normal pulmonary artery systolic pressure. The tricuspid  regurgitant velocity is 1.99 m/s, and   with an assumed right atrial pressure of 3 mmHg, the estimated right  ventricular systolic pressure is 18.8 mmHg.   Left Atrium: Left atrial  size was normal in size.   Right Atrium: Right atrial size was normal in size.   Pericardium: There is no evidence of pericardial effusion.   Mitral Valve: The mitral valve is abnormal. There is mild thickening of  the mitral valve leaflet(s). Trivial mitral valve regurgitation.   Tricuspid Valve: The tricuspid valve is grossly normal. Tricuspid valve  regurgitation is trivial.   Aortic Valve: Aortic regurgitation is eccentric, located on the commisure  between the left and right coronary cusps. The aortic valve is tricuspid.  Aortic valve regurgitation is mild. Aortic regurgitation PHT measures 655  msec. Mild aortic valve  sclerosis is present, with no evidence of aortic valve stenosis.   Pulmonic Valve: The pulmonic valve was normal in structure. Pulmonic valve  regurgitation is not visualized.   Aorta: The aortic root and ascending aorta are structurally normal, with  no evidence of dilitation.   Venous: The inferior vena cava is normal in size with greater than 50%  respiratory variability, suggesting right atrial pressure of 3 mmHg.   IAS/Shunts: No atrial level shunt detected by color flow Doppler.      Physical Exam:   VS:  BP 124/64   Pulse (!) 59   Ht 5\' 11"  (1.803 m)   Wt 173 lb 9.6 oz (78.7 kg)   SpO2 98%   BMI 24.21 kg/m    Wt Readings from Last 3 Encounters:  06/22/23 173 lb 9.6 oz (78.7 kg)  06/16/23 174 lb (78.9 kg)  05/05/23 176 lb (79.8 kg)    GEN: Well nourished, well developed in no acute distress NECK: No JVD; No carotid bruits CARDIAC: RRR, no murmurs, rubs, gallops RESPIRATORY:  Clear to auscultation without rales, wheezing or rhonchi  ABDOMEN: Soft, non-tender, non-distended EXTREMITIES:  No edema; No deformity   ASSESSMENT AND PLAN: .   1.  Preop Clearance  Mr. Dreyfuss's perioperative risk of a major cardiac event is 0.9% according to the Revised Cardiac Risk Index (RCRI).  Therefore, he is at low risk for perioperative complications.   His  functional capacity is good at 5.62 METs according to the Duke Activity Status Index (DASI). Recommendations: According to ACC/AHA guidelines, no further cardiovascular testing needed.  The patient may proceed to surgery at acceptable risk.   Antiplatelet and/or Anticoagulation Recommendations: Aspirin can be held for 5 days prior to his surgery.  Please resume Aspirin post operatively when it is felt to be safe from a bleeding standpoint.   2. CAD -Continue current medications including aspirin 81 mg daily, Lipitor 80 mg daily, coenzyme every 10 100 mg daily, lisinopril 2.5 mg daily, metoprolol succinate 25 mg daily, nitro as needed -No chest pain or shortness of breath -Eats a clean Mediterranean diet with grilled chicken and Greek salads -Continue current activity and will be enrolled with physical therapy following hip replacement  3.  HTN -Excellent control today, 124/64 -Continue current medication regimen -Continue heart healthy, low-sodium diet  3.  HLD -LDL 44 -Continue current medication  regimen  4.  HFrEF -Euvolemic on exam, no issues with lower extremity edema -no issues -Reviewed most recent echocardiogram      Dispo: He can follow-up in a year with Dr. Antoine Poche  Signed, Sharlene Dory, PA-C

## 2023-06-21 NOTE — Telephone Encounter (Signed)
   Name: Bradley [name]  DOB: 26-Jul-1956  MRN: [id number]  Primary Cardiologist: None  Chart reviewed as part of pre-operative protocol coverage. Because of Bradley [name]'s past medical history and time since last visit, he will require a follow-up in-office visit in order to better assess preoperative cardiovascular risk.  Pre-op covering staff: - Please schedule appointment and call patient to inform them. If patient already had an upcoming appointment within acceptable timeframe, please add "pre-op clearance" to the appointment notes so provider is aware. - Please contact requesting surgeon's office via preferred method (i.e, phone, fax) to inform them of need for appointment prior to surgery.  Regarding ASA therapy, we recommend continuation of ASA throughout the perioperative period.    Napoleon Form, Leodis Rains, NP  06/21/2023, 11:08 AM

## 2023-06-21 NOTE — Telephone Encounter (Signed)
I will send a message to the surgeon's office as FYI the pt is going to need an IN OFFICE appt as last seen 05/2022. There are no available appts in time for his surgery date. Our office just received this request today. They will need to post pone surgery until the pt has had appt with cardiologist for pre op clearance.

## 2023-06-21 NOTE — Telephone Encounter (Signed)
I will send a message to our scheduling team to see if they may be able to find an appt to squeeze the pt in?

## 2023-06-22 ENCOUNTER — Ambulatory Visit: Payer: PPO | Admitting: Physician Assistant

## 2023-06-22 ENCOUNTER — Encounter: Payer: Self-pay | Admitting: Physician Assistant

## 2023-06-22 VITALS — BP 124/64 | HR 59 | Ht 71.0 in | Wt 173.6 lb

## 2023-06-22 DIAGNOSIS — E785 Hyperlipidemia, unspecified: Secondary | ICD-10-CM

## 2023-06-22 DIAGNOSIS — I10 Essential (primary) hypertension: Secondary | ICD-10-CM | POA: Diagnosis not present

## 2023-06-22 DIAGNOSIS — I5020 Unspecified systolic (congestive) heart failure: Secondary | ICD-10-CM

## 2023-06-22 DIAGNOSIS — I2510 Atherosclerotic heart disease of native coronary artery without angina pectoris: Secondary | ICD-10-CM | POA: Diagnosis not present

## 2023-06-22 MED ORDER — METOPROLOL SUCCINATE ER 25 MG PO TB24: ORAL_TABLET | ORAL | 3 refills | Status: DC

## 2023-06-22 MED ORDER — ATORVASTATIN CALCIUM 80 MG PO TABS: 80.0000 mg | ORAL_TABLET | Freq: Every day | ORAL | 3 refills | Status: DC

## 2023-06-22 MED ORDER — NITROGLYCERIN 0.4 MG SL SUBL: SUBLINGUAL_TABLET | SUBLINGUAL | 3 refills | Status: AC

## 2023-06-22 MED ORDER — LISINOPRIL 2.5 MG PO TABS: 2.5000 mg | ORAL_TABLET | Freq: Every day | ORAL | 3 refills | Status: DC

## 2023-06-22 NOTE — Patient Instructions (Signed)
Medication Instructions:   Your physician recommends that you continue on your current medications as directed. Please refer to the Current Medication list given to you today.   *If you need a refill on your cardiac medications before your next appointment, please call your pharmacy*   Lab Work:  Please get fasting labs after midnight with your Primary Care Doctor.  Paperwork given to patient today.   If you have labs (blood work) drawn today and your tests are completely normal, you will receive your results only by: MyChart Message (if you have MyChart) OR A paper copy in the mail If you have any lab test that is abnormal or we need to change your treatment, we will call you to review the results.   Testing/Procedures:  None ordered.   Follow-Up: At [facility], you and your health needs are our priority.  As part of our continuing mission to provide you with exceptional heart care, we have created designated Provider Care Teams.  These Care Teams include your primary Cardiologist (physician) and Advanced Practice Providers (APPs -  Physician Assistants and Nurse Practitioners) who all work together to provide you with the care you need, when you need it.  We recommend signing up for the patient portal called "MyChart".  Sign up information is provided on this After Visit Summary.  MyChart is used to connect with patients for Virtual Visits (Telemedicine).  Patients are able to view lab/test results, encounter notes, upcoming appointments, etc.  Non-urgent messages can be sent to your provider as well.   To learn more about what you can do with MyChart, go to [email or web address].    Your next appointment:   1 year(s)  Provider:   Angelina Sheriff, MD    Other Instructions  Your physician wants you to follow-up in: 1 year.  You will receive a reminder letter in the mail two months in advance. If you don't receive a letter, please call our office to schedule the  follow-up appointment.  DASH Eating Plan DASH stands for Dietary Approaches to Stop Hypertension. The DASH eating plan is a healthy eating plan that has been shown to: Lower high blood pressure (hypertension). Reduce your risk for type 2 diabetes, heart disease, and stroke. Help with weight loss. What are tips for following this plan? Reading food labels Check food labels for the amount of salt (sodium) per serving. Choose foods with less than 5 percent of the Daily Value (DV) of sodium. In general, foods with less than 300 milligrams (mg) of sodium per serving fit into this eating plan. To find whole grains, look for the word "whole" as the first word in the ingredient list. Shopping Buy products labeled as "low-sodium" or "no salt added." Buy fresh foods. Avoid canned foods and pre-made or frozen meals. Cooking Try not to add salt when you cook. Use salt-free seasonings or herbs instead of table salt or sea salt. Check with your health care provider or pharmacist before using salt substitutes. Do not fry foods. Cook foods in healthy ways, such as baking, boiling, grilling, roasting, or broiling. Cook using oils that are good for your heart. These include olive, canola, avocado, soybean, and sunflower oil. Meal planning  Eat a balanced diet. This should include: 4 or more servings of fruits and 4 or more servings of vegetables each day. Try to fill half of your plate with fruits and vegetables. 6-8 servings of whole grains each day. 6 or less servings of lean meat, poultry, or fish  each day. 1 oz is 1 serving. A 3 oz (85 g) serving of meat is about the same size as the palm of your hand. One egg is 1 oz (28 g). 2-3 servings of low-fat dairy each day. One serving is 1 cup (237 mL). 1 serving of nuts, seeds, or beans 5 times each week. 2-3 servings of heart-healthy fats. Healthy fats called omega-3 fatty acids are found in foods such as walnuts, flaxseeds, fortified milks, and eggs. These  fats are also found in cold-water fish, such as sardines, salmon, and mackerel. Limit how much you eat of: Canned or prepackaged foods. Food that is high in trans fat, such as fried foods. Food that is high in saturated fat, such as fatty meat. Desserts and other sweets, sugary drinks, and other foods with added sugar. Full-fat dairy products. Do not salt foods before eating. Do not eat more than 4 egg yolks a week. Try to eat at least 2 vegetarian meals a week. Eat more home-cooked food and less restaurant, buffet, and fast food. Lifestyle When eating at a restaurant, ask if your food can be made with less salt or no salt. If you drink alcohol: Limit how much you have to: 0-1 drink a day if you are male. 0-2 drinks a day if you are male. Know how much alcohol is in your drink. In the [place] one drink is one 12 oz bottle of beer (355 mL), one 5 oz glass of wine (148 mL), or one 1 oz glass of hard liquor (44 mL). General information Avoid eating more than 2,300 mg of salt a day. If you have hypertension, you may need to reduce your sodium intake to 1,500 mg a day. Work with your provider to stay at a healthy body weight or lose weight. Ask what the best weight range is for you. On most days of the week, get at least 30 minutes of exercise that causes your heart to beat faster. This may include walking, swimming, or biking. Work with your provider or dietitian to adjust your eating plan to meet your specific calorie needs. What foods should I eat? Fruits All fresh, dried, or frozen fruit. Canned fruits that are in their natural juice and do not have sugar added to them. Vegetables Fresh or frozen vegetables that are raw, steamed, roasted, or grilled. Low-sodium or reduced-sodium tomato and vegetable juice. Low-sodium or reduced-sodium tomato sauce and tomato paste. Low-sodium or reduced-sodium canned vegetables. Grains Whole-grain or whole-wheat bread. Whole-grain or whole-wheat pasta.  Brown rice. Orpah Cobb. Bulgur. Whole-grain and low-sodium cereals. Pita bread. Low-fat, low-sodium crackers. Whole-wheat flour tortillas. Meats and other proteins Skinless chicken or [place]. Ground chicken or [place]. Pork with fat trimmed off. Fish and seafood. Egg whites. Dried beans, peas, or lentils. Unsalted nuts, nut butters, and seeds. Unsalted canned beans. Lean cuts of beef with fat trimmed off. Low-sodium, lean precooked or cured meat, such as sausages or meat loaves. Dairy Low-fat (1%) or fat-free (skim) milk. Reduced-fat, low-fat, or fat-free cheeses. Nonfat, low-sodium ricotta or cottage cheese. Low-fat or nonfat yogurt. Low-fat, low-sodium cheese. Fats and oils Soft margarine without trans fats. Vegetable oil. Reduced-fat, low-fat, or light mayonnaise and salad dressings (reduced-sodium). Canola, safflower, olive, avocado, soybean, and sunflower oils. Avocado. Seasonings and condiments Herbs. Spices. Seasoning mixes without salt. Other foods Unsalted popcorn and pretzels. Fat-free sweets. The items listed above may not be all the foods and drinks you can have. Talk to a dietitian to learn more. What foods should I  avoid? Fruits Canned fruit in a light or heavy syrup. Fried fruit. Fruit in cream or butter sauce. Vegetables Creamed or fried vegetables. Vegetables in a cheese sauce. Regular canned vegetables that are not marked as low-sodium or reduced-sodium. Regular canned tomato sauce and paste that are not marked as low-sodium or reduced-sodium. Regular tomato and vegetable juices that are not marked as low-sodium or reduced-sodium. Rosita Fire. Olives. Grains Baked goods made with fat, such as croissants, muffins, or some breads. Dry pasta or rice meal packs. Meats and other proteins Fatty cuts of meat. Ribs. Fried meat. Tomasa Blase. Bologna, salami, and other precooked or cured meats, such as sausages or meat loaves, that are not lean and low in sodium. Fat from the back of a pig  (fatback). Bratwurst. Salted nuts and seeds. Canned beans with added salt. Canned or smoked fish. Whole eggs or egg yolks. Chicken or [place] with skin. Dairy Whole or 2% milk, cream, and half-and-half. Whole or full-fat cream cheese. Whole-fat or sweetened yogurt. Full-fat cheese. Nondairy creamers. Whipped toppings. Processed cheese and cheese spreads. Fats and oils Butter. Stick margarine. Lard. Shortening. Ghee. Bacon fat. Tropical oils, such as coconut, palm kernel, or palm oil. Seasonings and condiments Onion salt, garlic salt, seasoned salt, table salt, and sea salt. Worcestershire sauce. Tartar sauce. Barbecue sauce. Teriyaki sauce. Soy sauce, including reduced-sodium soy sauce. Steak sauce. Canned and packaged gravies. Fish sauce. Oyster sauce. Cocktail sauce. Store-bought horseradish. Ketchup. Mustard. Meat flavorings and tenderizers. Bouillon cubes. Hot sauces. Pre-made or packaged marinades. Pre-made or packaged taco seasonings. Relishes. Regular salad dressings. Other foods Salted popcorn and pretzels. The items listed above may not be all the foods and drinks you should avoid. Talk to a dietitian to learn more. Where to find more information National Heart, Lung, and Blood Institute (NHLBI): BuffaloDryCleaner.gl American Heart Association (AHA): heart.org Academy of Nutrition and Dietetics: eatright.org National Kidney Foundation (NKF): kidney.org This information is not intended to replace advice given to you by your health care provider. Make sure you discuss any questions you have with your health care provider. Document Revised: 11/26/2022 Document Reviewed: 11/26/2022 Elsevier Patient Education  2024 Elsevier Inc. Adopting a Healthy Lifestyle.   Weight: Know what a healthy weight is for you (roughly BMI <25) and aim to maintain this. You can calculate your body mass index on your smart phone. Unfortunately, this is not the most accurate measure of healthy weight, but it is the  simplest measurement to use. A more accurate measurement involves body scanning which measures lean muscle, fat tissue and bony density. We do not have this equipment at [facility].    Diet: Aim for 7+ servings of fruits and vegetables daily Limit animal fats in diet for cholesterol and heart health - choose grass fed whenever available Avoid highly processed foods (fast food burgers, tacos, fried chicken, pizza, hot dogs, french fries)  Saturated fat comes in the form of butter, lard, coconut oil, margarine, partially hydrogenated oils, and fat in meat. These increase your risk of cardiovascular disease.  Use healthy plant oils, such as olive, canola, soy, corn, sunflower and peanut.  Whole foods such as fruits, vegetables and whole grains have fiber  Men need > 38 grams of fiber per day Women need > 25 grams of fiber per day  Load up on vegetables and fruits - one-half of your plate: Aim for color and variety, and remember that potatoes dont count. Go for whole grains - one-quarter of your plate: Whole wheat, barley, wheat berries, quinoa, oats,  brown rice, and foods made with them. If you want pasta, go with whole wheat pasta. Protein power - one-quarter of your plate: Fish, chicken, beans, and nuts are all healthy, versatile protein sources. Limit red meat. You need carbohydrates for energy! The type of carbohydrate is more important than the amount. Choose carbohydrates such as vegetables, fruits, whole grains, beans, and nuts in the place of white rice, white pasta, potatoes (baked or fried), macaroni and cheese, cakes, cookies, and donuts.  If youre thirsty, drink water. Coffee and tea are good in moderation, but skip sugary drinks and limit milk and dairy products to one or two daily servings. Keep sugar intake at 6 teaspoons or 24 grams or LESS       Exercise: Aim for 150 min of moderate intensity exercise weekly for heart health, and weights twice weekly for bone health Stay active -  any steps are better than no steps! Aim for 7-9 hours of sleep daily

## 2023-06-23 NOTE — Anesthesia Preprocedure Evaluation (Addendum)
Anesthesia Evaluation  Patient identified by MRN, date of birth, ID band Patient awake    Reviewed: Allergy & Precautions, NPO status , Patient's Chart, lab work & pertinent test results, reviewed documented beta blocker date and time   History of Anesthesia Complications Negative for: history of anesthetic complications  Airway Mallampati: I  TM Distance: >3 FB Neck ROM: Full    Dental  (+) Teeth Intact, Dental Advisory Given   Pulmonary neg pulmonary ROS   breath sounds clear to auscultation       Cardiovascular hypertension, Pt. on medications and Pt. on home beta blockers (-) angina + CAD, + Past MI and + Cardiac Stents (2016 LAD)   Rhythm:Regular Rate:Normal  '22 ECHO: EF 50 to 55%.  1. LV EF 53 %. The left ventricle has normal function, no regional wall motion  abnormalities. Grade I diastolic dysfunction (impaired relaxation).   2. RVF is normal. The right ventricular size is normal. There is normal pulmonary artery systolic pressure.   3. The mitral valve is abnormal. Trivial mitral valve regurgitation.   4. Aortic regurgitation is eccentric, located on the commisure between the left and right coronary cusps. The aortic valve is tricuspid. AI is mild. Mild aortic valve sclerosis is present, with no evidence of aortic valve stenosis     Neuro/Psych negative neurological ROS     GI/Hepatic negative GI ROS, Neg liver ROS,,,  Endo/Other  negative endocrine ROS    Renal/GU negative Renal ROS     Musculoskeletal  (+) Arthritis ,    Abdominal   Peds  Hematology   Anesthesia Other Findings   Reproductive/Obstetrics                             Anesthesia Physical Anesthesia Plan  ASA: 3  Anesthesia Plan: Spinal   Post-op Pain Management: Tylenol PO (pre-op)*   Induction:   PONV Risk Score and Plan: 1  Airway Management Planned: Natural Airway and Simple Face Mask  Additional  Equipment: None  Intra-op Plan:   Post-operative Plan:   Informed Consent: I have reviewed the patients History and Physical, chart, labs and discussed the procedure including the risks, benefits and alternatives for the proposed anesthesia with the patient or authorized representative who has indicated his/her understanding and acceptance.     Dental advisory given  Plan Discussed with: CRNA and Surgeon  Anesthesia Plan Comments: (See PAT note 06/16/2023)       Anesthesia Quick Evaluation

## 2023-06-24 ENCOUNTER — Telehealth: Payer: Self-pay | Admitting: *Deleted

## 2023-06-24 NOTE — H&P (Signed)
TOTAL HIP ADMISSION H&P  Patient is admitted for left total hip arthroplasty.  Subjective:  Chief Complaint: left hip pain  HPI: Bradley [name], 67 y.o. male, has a history of pain and functional disability in the left hip(s) due to arthritis and patient has failed non-surgical conservative treatments for greater than 12 weeks to include NSAID's and/or analgesics, flexibility and strengthening excercises, and activity modification.  Onset of symptoms was gradual starting 3 years ago with gradually worsening course since that time.The patient noted no past surgery on the left hip(s).  Patient currently rates pain in the left hip at 10 out of 10 with activity. Patient has night pain, worsening of pain with activity and weight bearing, pain that interfers with activities of daily living, and pain with passive range of motion. Patient has evidence of subchondral cysts, subchondral sclerosis, periarticular osteophytes, and joint space narrowing by imaging studies. This condition presents safety issues increasing the risk of falls.  There is no current active infection.  Patient Active Problem List   Diagnosis Date Noted   Primary osteoarthritis of left hip 04/14/2023   Coronary artery disease involving native coronary artery of native heart without angina pectoris 11/01/2019   Essential hypertension 11/01/2019   Educated about COVID-19 virus infection 11/01/2019   CAD S/P LAD DES 08/14/2015   Dyslipidemia 08/14/2015   Acute ST elevation myocardial infarction (STEMI) involving left anterior descending coronary artery (HCC) 08/02/2015   Cardiomyopathy, ischemic:  08/02/2015   Family history of early CAD 05/30/2015   Right inguinal hernia 10/09/2013   ARTHRITIS, RIGHT HIP 04/29/2009   Pain in limb 08/30/2007   UNEQUAL LEG LENGTH, ACQUIRED 08/30/2007   Past Medical History:  Diagnosis Date   Arthritis    CAD in native artery    Hypertension    LV dysfunction    EF post MI 35-40%   STEMI (ST  elevation myocardial infarction) (HCC)    ant wall,  Xience alpine to LAD      Past Surgical History:  Procedure Laterality Date   CARDIAC CATHETERIZATION N/A 08/01/2015   Procedure: Left Heart Cath and Coronary Angiography;  Surgeon: Marykay Lex, MD;  Location: [facility] INVASIVE CV LAB;  Service: Cardiovascular;  Laterality: N/A;   CARDIAC CATHETERIZATION N/A 08/01/2015   Procedure: Coronary Stent Intervention;  Surgeon: Marykay Lex, MD;  Location: [facility] INVASIVE CV LAB;  Service: Cardiovascular;  Laterality: N/A;   HERNIA REPAIR     HIP RESURFACING Right    INGUINAL HERNIA REPAIR Right 10/23/2013   Procedure: HERNIA REPAIR INGUINAL ADULT;  Surgeon: Shelly Rubenstein, MD;  Location: [facility] OR;  Service: General;  Laterality: Right;   INGUINAL HERNIA REPAIR Left 12/24/2016   Procedure: LEFT INGUINAL HERNIA REPAIR;  Surgeon: Abigail Miyamoto, MD;  Location: [facility] OR;  Service: General;  Laterality: Left;   INSERTION OF MESH Right 10/23/2013   Procedure: INSERTION OF MESH;  Surgeon: Shelly Rubenstein, MD;  Location: [facility] OR;  Service: General;  Laterality: Right;   INSERTION OF MESH Left 12/24/2016   Procedure: INSERTION OF MESH;  Surgeon: Abigail Miyamoto, MD;  Location: [facility] OR;  Service: General;  Laterality: Left;   REPAIR ANKLE LIGAMENT     TONSILLECTOMY      No current facility-administered medications for this encounter.   Current Outpatient Medications  Medication Sig Dispense Refill Last Dose   aspirin EC 81 MG tablet Take 81 mg by mouth daily.      Coenzyme Q10 (CO Q-10) 100 MG CAPS Take  100 mg by mouth 2 (two) times daily.      naproxen sodium (ALEVE) 220 MG tablet Take 220 mg by mouth daily as needed (pain).      atorvastatin (LIPITOR) 80 MG tablet Take 1 tablet (80 mg total) by mouth daily. 90 tablet 3    lisinopril (ZESTRIL) 2.5 MG tablet Take 1 tablet (2.5 mg total) by mouth daily. 90 tablet 3    metoprolol succinate (TOPROL-XL) 25 MG 24 hr tablet TAKE 1/2 TABLET(12.5 MG) BY MOUTH  DAILY 45 tablet 3    nitroGLYCERIN (NITROSTAT) 0.4 MG SL tablet DISSOLVE 1 TABLET UNDER THE TONGUE UNDER THE TONGUE EVERY 5 MINUTES AS NEEDED FOR CHEST PAIN 25 tablet 3    No Active Allergies  Social History   Tobacco Use   Smoking status: Never   Smokeless tobacco: Never  Substance Use Topics   Alcohol use: Yes    Alcohol/week: 1.0 standard drink of alcohol    Types: 1 Glasses of wine per week    Comment: occasion    Family History  Problem Relation Age of Onset   Stroke Mother 49   Cancer Mother        breast   CAD Mother 82   CAD Father 53       CABG   CAD Sister 68   Cancer Brother        testicular cancer   Hyperlipidemia Brother    Hyperlipidemia Brother      Review of Systems  Objective:  Physical Exam Vitals reviewed.  Constitutional:      Appearance: Normal appearance. He is normal weight.  HENT:     Head: Normocephalic and atraumatic.  Eyes:     Extraocular Movements: Extraocular movements intact.     Pupils: Pupils are equal, round, and reactive to light.  Cardiovascular:     Rate and Rhythm: Normal rate and regular rhythm.     Pulses: Normal pulses.  Pulmonary:     Effort: Pulmonary effort is normal.     Breath sounds: Normal breath sounds.  Abdominal:     Palpations: Abdomen is soft.  Musculoskeletal:     Cervical back: Normal range of motion and neck supple.     Left hip: Tenderness and bony tenderness present. Decreased range of motion. Decreased strength.  Neurological:     Mental Status: He is alert and oriented to person, place, and time.  Psychiatric:        Behavior: Behavior normal.     Vital signs in last 24 hours:    Labs:   Estimated body mass index is 24.21 kg/m as calculated from the following:   Height as of 06/22/23: 5\' 11"  (1.803 m).   Weight as of 06/22/23: 78.7 kg.   Imaging Review Plain radiographs demonstrate severe degenerative joint disease of the left hip(s). The bone quality appears to be excellent for age  and reported activity level.      Assessment/Plan:  End stage arthritis, left hip(s)  The patient history, physical examination, clinical judgement of the provider and imaging studies are consistent with end stage degenerative joint disease of the left hip(s) and total hip arthroplasty is deemed medically necessary. The treatment options including medical management, injection therapy, arthroscopy and arthroplasty were discussed at length. The risks and benefits of total hip arthroplasty were presented and reviewed. The risks due to aseptic loosening, infection, stiffness, dislocation/subluxation,  thromboembolic complications and other imponderables were discussed.  The patient acknowledged the explanation, agreed to proceed with  the plan and consent was signed. Patient is being admitted for inpatient treatment for surgery, pain control, PT, OT, prophylactic antibiotics, VTE prophylaxis, progressive ambulation and ADL's and discharge planning.The patient is planning to be discharged home with home health services

## 2023-06-24 NOTE — Care Plan (Signed)
[facility] RNCM call to patient to discuss his upcoming Left total hip arthroplasty with Dr. Magnus Ivan on 06/25/23. He is an Ortho bundle patient through [facility] and is agreeable to case management. He has a sister that will be staying with him to assist after discharge. He has a walker, but unsure if standard or has 2 wheels. Anticipate HHPT will be needed after a short hospital stay. Referral made to [facility] after choice provided. Reviewed all post op care instructions. Will continue to follow for needs.

## 2023-06-24 NOTE — Telephone Encounter (Signed)
Ortho [facility] pre-op call completed. 

## 2023-06-25 ENCOUNTER — Encounter (HOSPITAL_COMMUNITY)
Admission: RE | Disposition: A | Discharge status: Home / Self Care | Payer: Self-pay | Source: Ambulatory Visit | Attending: Orthopaedic Surgery

## 2023-06-25 ENCOUNTER — Ambulatory Visit (HOSPITAL_BASED_OUTPATIENT_CLINIC_OR_DEPARTMENT_OTHER): Payer: PPO | Admitting: Registered Nurse

## 2023-06-25 ENCOUNTER — Ambulatory Visit (HOSPITAL_COMMUNITY): Payer: PPO

## 2023-06-25 ENCOUNTER — Other Ambulatory Visit: Payer: Self-pay

## 2023-06-25 ENCOUNTER — Ambulatory Visit (HOSPITAL_COMMUNITY): Payer: PPO | Admitting: Physician Assistant

## 2023-06-25 ENCOUNTER — Encounter (HOSPITAL_COMMUNITY): Payer: Self-pay | Admitting: Orthopaedic Surgery

## 2023-06-25 ENCOUNTER — Observation Stay (HOSPITAL_COMMUNITY): Payer: PPO

## 2023-06-25 ENCOUNTER — Observation Stay (HOSPITAL_COMMUNITY)
Admission: RE | Admit: 2023-06-25 | Discharge: 2023-06-26 | Disposition: A | Discharge status: Home / Self Care | Payer: PPO | Source: Ambulatory Visit | Attending: Orthopaedic Surgery | Admitting: Orthopaedic Surgery

## 2023-06-25 DIAGNOSIS — Z96643 Presence of artificial hip joint, bilateral: Secondary | ICD-10-CM | POA: Diagnosis not present

## 2023-06-25 DIAGNOSIS — I2510 Atherosclerotic heart disease of native coronary artery without angina pectoris: Secondary | ICD-10-CM | POA: Diagnosis not present

## 2023-06-25 DIAGNOSIS — Z7982 Long term (current) use of aspirin: Secondary | ICD-10-CM | POA: Insufficient documentation

## 2023-06-25 DIAGNOSIS — E785 Hyperlipidemia, unspecified: Secondary | ICD-10-CM

## 2023-06-25 DIAGNOSIS — I10 Essential (primary) hypertension: Secondary | ICD-10-CM | POA: Insufficient documentation

## 2023-06-25 DIAGNOSIS — M1612 Unilateral primary osteoarthritis, left hip: Principal | ICD-10-CM | POA: Insufficient documentation

## 2023-06-25 DIAGNOSIS — Z79899 Other long term (current) drug therapy: Secondary | ICD-10-CM | POA: Diagnosis not present

## 2023-06-25 DIAGNOSIS — Z96642 Presence of left artificial hip joint: Secondary | ICD-10-CM

## 2023-06-25 DIAGNOSIS — Z471 Aftercare following joint replacement surgery: Secondary | ICD-10-CM | POA: Diagnosis not present

## 2023-06-25 HISTORY — PX: TOTAL HIP ARTHROPLASTY: SHX124

## 2023-06-25 SURGERY — ARTHROPLASTY, HIP, TOTAL, ANTERIOR APPROACH
Anesthesia: Spinal | Site: Hip | Laterality: Left

## 2023-06-25 MED ORDER — PHENYLEPHRINE HCL-NACL 20-0.9 MG/250ML-% IV SOLN
INTRAVENOUS | Status: DC | PRN
Start: 2023-06-25 — End: 2023-06-25
  Administered 2023-06-25: 25 ug/min via INTRAVENOUS

## 2023-06-25 MED ORDER — ACETAMINOPHEN 500 MG PO TABS
1000.0000 mg | ORAL_TABLET | Freq: Once | ORAL | Status: AC
  Administered 2023-06-25: 1000 mg via ORAL
  Filled 2023-06-25: qty 2

## 2023-06-25 MED ORDER — STERILE WATER FOR IRRIGATION IR SOLN
Status: DC | PRN
  Administered 2023-06-25: 2000 mL

## 2023-06-25 MED ORDER — METOCLOPRAMIDE HCL 5 MG/ML IJ SOLN: 5.0000 mg | Freq: Three times a day (TID) | INTRAMUSCULAR | Status: DC | PRN

## 2023-06-25 MED ORDER — MIDAZOLAM HCL 2 MG/2ML IJ SOLN
INTRAMUSCULAR | Status: AC
  Filled 2023-06-25: qty 2

## 2023-06-25 MED ORDER — PHENOL 1.4 % MT LIQD: 1.0000 | OROMUCOSAL | Status: DC | PRN

## 2023-06-25 MED ORDER — OXYCODONE HCL 5 MG PO TABS: 5.0000 mg | ORAL_TABLET | Freq: Once | ORAL | Status: DC | PRN

## 2023-06-25 MED ORDER — LACTATED RINGERS IV SOLN: INTRAVENOUS | Status: DC

## 2023-06-25 MED ORDER — LISINOPRIL 5 MG PO TABS
2.5000 mg | ORAL_TABLET | Freq: Every day | ORAL | Status: DC
  Administered 2023-06-26: 2.5 mg via ORAL
  Filled 2023-06-25: qty 1

## 2023-06-25 MED ORDER — PROMETHAZINE HCL 25 MG/ML IJ SOLN: 6.2500 mg | INTRAMUSCULAR | Status: DC | PRN

## 2023-06-25 MED ORDER — BUPIVACAINE IN DEXTROSE 0.75-8.25 % IT SOLN
INTRATHECAL | Status: DC | PRN
  Administered 2023-06-25: 13.5 mg via INTRATHECAL

## 2023-06-25 MED ORDER — ORAL CARE MOUTH RINSE: 15.0000 mL | Freq: Once | OROMUCOSAL | Status: AC

## 2023-06-25 MED ORDER — ALUM & MAG HYDROXIDE-SIMETH 200-200-20 MG/5ML PO SUSP: 30.0000 mL | ORAL | Status: DC | PRN

## 2023-06-25 MED ORDER — POVIDONE-IODINE 10 % EX SWAB
2.0000 | Freq: Once | CUTANEOUS | Status: AC
  Administered 2023-06-25: 2 via TOPICAL

## 2023-06-25 MED ORDER — 0.9 % SODIUM CHLORIDE (POUR BTL) OPTIME
TOPICAL | Status: DC | PRN
  Administered 2023-06-25: 1000 mL

## 2023-06-25 MED ORDER — METOCLOPRAMIDE HCL 5 MG PO TABS: 5.0000 mg | ORAL_TABLET | Freq: Three times a day (TID) | ORAL | Status: DC | PRN

## 2023-06-25 MED ORDER — CHLORHEXIDINE GLUCONATE 0.12 % MT SOLN
15.0000 mL | Freq: Once | OROMUCOSAL | Status: AC
  Administered 2023-06-25: 15 mL via OROMUCOSAL

## 2023-06-25 MED ORDER — METHOCARBAMOL 500 MG IVPB - SIMPLE MED
INTRAVENOUS | Status: AC
  Filled 2023-06-25: qty 55

## 2023-06-25 MED ORDER — EPHEDRINE 5 MG/ML INJ
INTRAVENOUS | Status: AC
  Filled 2023-06-25: qty 5

## 2023-06-25 MED ORDER — PROPOFOL 1000 MG/100ML IV EMUL
INTRAVENOUS | Status: AC
  Filled 2023-06-25: qty 100

## 2023-06-25 MED ORDER — MEPERIDINE HCL 50 MG/ML IJ SOLN: 6.2500 mg | INTRAMUSCULAR | Status: DC | PRN

## 2023-06-25 MED ORDER — ATORVASTATIN CALCIUM 40 MG PO TABS
80.0000 mg | ORAL_TABLET | Freq: Every day | ORAL | Status: DC
  Administered 2023-06-25 – 2023-06-26 (×2): 80 mg via ORAL
  Filled 2023-06-25 (×2): qty 2

## 2023-06-25 MED ORDER — CEFAZOLIN SODIUM-DEXTROSE 1-4 GM/50ML-% IV SOLN
1.0000 g | Freq: Four times a day (QID) | INTRAVENOUS | Status: AC
  Administered 2023-06-25 – 2023-06-26 (×2): 1 g via INTRAVENOUS
  Filled 2023-06-25 (×2): qty 50

## 2023-06-25 MED ORDER — FENTANYL CITRATE (PF) 100 MCG/2ML IJ SOLN
INTRAMUSCULAR | Status: AC
  Filled 2023-06-25: qty 2

## 2023-06-25 MED ORDER — OXYCODONE HCL 5 MG/5ML PO SOLN: 5.0000 mg | Freq: Once | ORAL | Status: DC | PRN

## 2023-06-25 MED ORDER — CO Q-10 100 MG PO CAPS: 100.0000 mg | ORAL_CAPSULE | Freq: Two times a day (BID) | ORAL | Status: DC

## 2023-06-25 MED ORDER — HYDROMORPHONE HCL 1 MG/ML IJ SOLN: 0.2500 mg | INTRAMUSCULAR | Status: DC | PRN

## 2023-06-25 MED ORDER — DIPHENHYDRAMINE HCL 12.5 MG/5ML PO ELIX
12.5000 mg | ORAL_SOLUTION | ORAL | Status: DC | PRN
  Administered 2023-06-26: 25 mg via ORAL
  Filled 2023-06-25: qty 10

## 2023-06-25 MED ORDER — ONDANSETRON HCL 4 MG/2ML IJ SOLN
INTRAMUSCULAR | Status: DC | PRN
  Administered 2023-06-25: 4 mg via INTRAVENOUS

## 2023-06-25 MED ORDER — PROPOFOL 500 MG/50ML IV EMUL
INTRAVENOUS | Status: DC | PRN
  Administered 2023-06-25: 40 ug/kg/min via INTRAVENOUS

## 2023-06-25 MED ORDER — ONDANSETRON HCL 4 MG/2ML IJ SOLN
INTRAMUSCULAR | Status: AC
  Filled 2023-06-25: qty 2

## 2023-06-25 MED ORDER — FENTANYL CITRATE (PF) 100 MCG/2ML IJ SOLN
INTRAMUSCULAR | Status: DC | PRN
  Administered 2023-06-25: 50 ug via INTRAVENOUS

## 2023-06-25 MED ORDER — CEFAZOLIN SODIUM-DEXTROSE 2-4 GM/100ML-% IV SOLN
2.0000 g | INTRAVENOUS | Status: AC
  Administered 2023-06-25: 2 g via INTRAVENOUS
  Filled 2023-06-25: qty 100

## 2023-06-25 MED ORDER — SODIUM CHLORIDE 0.9 % IR SOLN
Status: DC | PRN
  Administered 2023-06-25: 1000 mL

## 2023-06-25 MED ORDER — DEXAMETHASONE SODIUM PHOSPHATE 10 MG/ML IJ SOLN
INTRAMUSCULAR | Status: AC
  Filled 2023-06-25: qty 1

## 2023-06-25 MED ORDER — SODIUM CHLORIDE 0.9 % IV SOLN: INTRAVENOUS | Status: DC

## 2023-06-25 MED ORDER — OXYCODONE HCL 5 MG PO TABS: 10.0000 mg | ORAL_TABLET | ORAL | Status: DC | PRN

## 2023-06-25 MED ORDER — MIDAZOLAM HCL 2 MG/2ML IJ SOLN: 0.5000 mg | Freq: Once | INTRAMUSCULAR | Status: DC | PRN

## 2023-06-25 MED ORDER — METHOCARBAMOL 500 MG PO TABS
500.0000 mg | ORAL_TABLET | Freq: Four times a day (QID) | ORAL | Status: DC | PRN
  Administered 2023-06-26 (×2): 500 mg via ORAL
  Filled 2023-06-25 (×2): qty 1

## 2023-06-25 MED ORDER — DEXAMETHASONE SODIUM PHOSPHATE 10 MG/ML IJ SOLN
INTRAMUSCULAR | Status: DC | PRN
  Administered 2023-06-25: 8 mg via INTRAVENOUS

## 2023-06-25 MED ORDER — ONDANSETRON HCL 4 MG/2ML IJ SOLN: 4.0000 mg | Freq: Four times a day (QID) | INTRAMUSCULAR | Status: DC | PRN

## 2023-06-25 MED ORDER — ONDANSETRON HCL 4 MG PO TABS: 4.0000 mg | ORAL_TABLET | Freq: Four times a day (QID) | ORAL | Status: DC | PRN

## 2023-06-25 MED ORDER — MENTHOL 3 MG MT LOZG: 1.0000 | LOZENGE | OROMUCOSAL | Status: DC | PRN

## 2023-06-25 MED ORDER — METHOCARBAMOL 500 MG IVPB - SIMPLE MED
500.0000 mg | Freq: Four times a day (QID) | INTRAVENOUS | Status: DC | PRN
  Administered 2023-06-25: 500 mg via INTRAVENOUS

## 2023-06-25 MED ORDER — DOCUSATE SODIUM 100 MG PO CAPS
100.0000 mg | ORAL_CAPSULE | Freq: Two times a day (BID) | ORAL | Status: DC
  Administered 2023-06-25 – 2023-06-26 (×2): 100 mg via ORAL
  Filled 2023-06-25 (×2): qty 1

## 2023-06-25 MED ORDER — ASPIRIN 81 MG PO CHEW
81.0000 mg | CHEWABLE_TABLET | Freq: Two times a day (BID) | ORAL | Status: DC
  Administered 2023-06-25 – 2023-06-26 (×2): 81 mg via ORAL
  Filled 2023-06-25 (×2): qty 1

## 2023-06-25 MED ORDER — EPHEDRINE SULFATE-NACL 50-0.9 MG/10ML-% IV SOSY
PREFILLED_SYRINGE | INTRAVENOUS | Status: DC | PRN
  Administered 2023-06-25 (×2): 5 mg via INTRAVENOUS

## 2023-06-25 MED ORDER — HYDROMORPHONE HCL 1 MG/ML IJ SOLN: 0.5000 mg | INTRAMUSCULAR | Status: DC | PRN

## 2023-06-25 MED ORDER — OXYCODONE HCL 5 MG PO TABS
5.0000 mg | ORAL_TABLET | ORAL | Status: DC | PRN
  Administered 2023-06-25 – 2023-06-26 (×3): 5 mg via ORAL
  Filled 2023-06-25 (×3): qty 1

## 2023-06-25 MED ORDER — METOPROLOL SUCCINATE ER 25 MG PO TB24
12.5000 mg | ORAL_TABLET | Freq: Every day | ORAL | Status: DC
  Administered 2023-06-25: 12.5 mg via ORAL
  Filled 2023-06-25: qty 1

## 2023-06-25 MED ORDER — TRANEXAMIC ACID-NACL 1000-0.7 MG/100ML-% IV SOLN
1000.0000 mg | INTRAVENOUS | Status: AC
  Administered 2023-06-25: 1000 mg via INTRAVENOUS
  Filled 2023-06-25: qty 100

## 2023-06-25 MED ORDER — PANTOPRAZOLE SODIUM 40 MG PO TBEC
40.0000 mg | DELAYED_RELEASE_TABLET | Freq: Every day | ORAL | Status: DC
  Administered 2023-06-26: 40 mg via ORAL
  Filled 2023-06-25: qty 1

## 2023-06-25 MED ORDER — MIDAZOLAM HCL 5 MG/5ML IJ SOLN
INTRAMUSCULAR | Status: DC | PRN
  Administered 2023-06-25: 2 mg via INTRAVENOUS

## 2023-06-25 MED ORDER — METOPROLOL SUCCINATE ER 25 MG PO TB24
12.5000 mg | ORAL_TABLET | Freq: Every day | ORAL | Status: DC
  Administered 2023-06-26: 12.5 mg via ORAL
  Filled 2023-06-25: qty 1

## 2023-06-25 MED ORDER — ACETAMINOPHEN 325 MG PO TABS
325.0000 mg | ORAL_TABLET | Freq: Four times a day (QID) | ORAL | Status: DC | PRN
  Administered 2023-06-25: 650 mg via ORAL
  Filled 2023-06-25: qty 2

## 2023-06-25 SURGICAL SUPPLY — 41 items
APL SKNCLS STERI-STRIP NONHPOA (GAUZE/BANDAGES/DRESSINGS)
BAG COUNTER SPONGE SURGICOUNT (BAG) ×2 IMPLANT
BAG SPEC THK2 15X12 ZIP CLS (MISCELLANEOUS) ×1
BAG SPNG CNTER NS LX DISP (BAG) ×1
BAG ZIPLOCK 12X15 (MISCELLANEOUS) IMPLANT
BENZOIN TINCTURE PRP APPL 2/3 (GAUZE/BANDAGES/DRESSINGS) IMPLANT
BLADE SAW SGTL 18X1.27X75 (BLADE) ×2 IMPLANT
COVER PERINEAL POST (MISCELLANEOUS) ×2 IMPLANT
COVER SURGICAL LIGHT HANDLE (MISCELLANEOUS) ×2 IMPLANT
CUP SECTOR GRIPTON 58MM (Orthopedic Implant) IMPLANT
DRAPE FOOT SWITCH (DRAPES) ×2 IMPLANT
DRAPE STERI IOBAN 125X83 (DRAPES) ×2 IMPLANT
DRAPE U-SHAPE 47X51 STRL (DRAPES) ×4 IMPLANT
DRSG AQUACEL AG ADV 3.5X10 (GAUZE/BANDAGES/DRESSINGS) ×2 IMPLANT
DURAPREP 26ML APPLICATOR (WOUND CARE) ×2 IMPLANT
ELECT REM PT RETURN 15FT ADLT (MISCELLANEOUS) ×2 IMPLANT
GAUZE XEROFORM 1X8 LF (GAUZE/BANDAGES/DRESSINGS) IMPLANT
GLOVE BIO SURGEON STRL SZ7.5 (GLOVE) ×2 IMPLANT
GLOVE BIOGEL PI IND STRL 8 (GLOVE) ×4 IMPLANT
GLOVE ECLIPSE 8.0 STRL XLNG CF (GLOVE) ×2 IMPLANT
GOWN STRL REUS W/ TWL XL LVL3 (GOWN DISPOSABLE) ×4 IMPLANT
GOWN STRL REUS W/TWL XL LVL3 (GOWN DISPOSABLE) ×2
HANDPIECE INTERPULSE COAX TIP (DISPOSABLE) ×1
HEAD CERAMIC 36 PLUS5 (Hips) IMPLANT
HOLDER FOLEY CATH W/STRAP (MISCELLANEOUS) ×2 IMPLANT
KIT TURNOVER KIT A (KITS) IMPLANT
LINER NEUTRAL 36X58 PLUS4 IMPLANT
PACK ANTERIOR HIP CUSTOM (KITS) ×2 IMPLANT
SET HNDPC FAN SPRY TIP SCT (DISPOSABLE) ×2 IMPLANT
STAPLER VISISTAT 35W (STAPLE) IMPLANT
STEM FEMORAL SZ5 HIGH ACTIS (Stem) IMPLANT
STRIP CLOSURE SKIN 1/2X4 (GAUZE/BANDAGES/DRESSINGS) IMPLANT
SUT ETHIBOND NAB CT1 #1 30IN (SUTURE) ×2 IMPLANT
SUT ETHILON 2 0 PS N (SUTURE) IMPLANT
SUT MNCRL AB 4-0 PS2 18 (SUTURE) IMPLANT
SUT VIC AB 0 CT1 36 (SUTURE) ×2 IMPLANT
SUT VIC AB 1 CT1 36 (SUTURE) ×2 IMPLANT
SUT VIC AB 2-0 CT1 27 (SUTURE) ×2
SUT VIC AB 2-0 CT1 TAPERPNT 27 (SUTURE) ×4 IMPLANT
TRAY FOLEY MTR SLVR 16FR STAT (SET/KITS/TRAYS/PACK) IMPLANT
YANKAUER SUCT BULB TIP NO VENT (SUCTIONS) ×2 IMPLANT

## 2023-06-25 NOTE — Interval H&P Note (Signed)
History and Physical Interval Note: The patient understands that he is here today for a left total hip replacement to treat his severe left hip arthritis.  There has been no acute or interval change in his medical status.  The risks and benefits of surgery have been discussed in detail and informed consent has been obtained.  The left operative hip has been marked.  06/25/2023 10:49 AM  Bradley [name]  has presented today for surgery, with the diagnosis of osteoarthritis left hip.  The various methods of treatment have been discussed with the patient and family. After consideration of risks, benefits and other options for treatment, the patient has consented to  Procedure(s): LEFT TOTAL HIP ARTHROPLASTY ANTERIOR APPROACH (Left) as a surgical intervention.  The patient's history has been reviewed, patient examined, no change in status, stable for surgery.  I have reviewed the patient's chart and labs.  Questions were answered to the patient's satisfaction.     Kathryne Hitch

## 2023-06-25 NOTE — Op Note (Signed)
Operative Note  Date of operation: 06/25/2023 Preoperative diagnosis: Left hip primary osteoarthritis Postoperative diagnosis: Same  Procedure: Left direct anterior total hip arthroplasty  Implants: Implant Name Type Inv. Item Serial No. Manufacturer Lot No. LRB No. Used Action  CUP SECTOR GRIPTON - [id number] Orthopedic Implant CUP SECTOR GRIPTON  DEPUY ORTHOPA[employer] [id number] Left 1 Implanted  LINER NEUTRAL 36X58 PLUS4 - [id number]  LINER NEUTRAL 36X58 PLUS4  DEPUY ORTHOPAEDICS [id number] Left 1 Implanted  STEM FEMORAL SZ5 HIGH ACTIS - [id number] Stem STEM FEMORAL SZ5 HIGH ACTIS  DEPUY ORTHOPAEDICS [id number] Left 1 Implanted  HEAD CERAMIC 36 PLUS5 - [id number] Hips HEAD CERAMIC 36 PLUS5  DEPUY ORTHOPAEDICS [id number] Left 1 Implanted   Surgeon: Vanita Panda. Magnus Ivan, MD Assistant: Rexene Edison, PA-C  Anesthesia: Spinal EBL: 300 cc Antibiotics: 2 g IV Ancef Complications: None  Indications: Bradley [name] is an active 67 year old gentleman with debilitating arthritis involving his left hip.  This has been seen easily on clinical exam and x-ray findings.  He is dealt with hip pain for some time now but he is at the point where it is detrimentally affecting his mobility, his quality of life and his actives daily living.  His pain is on a daily basis.  20 years ago he did have a resurfacing arthroplasty of his right hip and that continues to do well.  We had a long thorough discussion about direct anterior hip replacement surgery for his left side.  We talked about the risks and benefits of the surgery including the risk of acute blood loss anemia, nerve vessel injury, fracture, infection, DVT, dislocation, implant failure, leg length differences and wound healing issues.  We talked about her goals being hopefully decrease pain, improve mobility and overall improve quality of life.  Procedure description: After informed consent was obtained and the appropriate left hip was marked, the patient was brought  to the operating room and sat up on the stretcher where spinal anesthesia was obtained.  He was then laid in supine position on the stretcher and a Foley catheter was placed.  Traction boots were placed on both his feet and he was placed supine on the Hana fracture table with a perineal post in place in both legs and inline skeletal traction devices but no traction applied.  His left operative hip and pelvis was assessed radiographically and then the left hip was prepped and draped in DuraPrep and sterile drapes.  A timeout was called and he was identified as the correct patient and correct left hip.  An incision was then made just inferior and posterior to the ASIS and carried slightly obliquely down the leg.  Dissection was carried down to the tensor fascia lata muscle and the tensor fascia was then divided longitudinally to proceed with a direct interposed the hip.  Circumflex vessels were identified and cauterized.  The hip capsule was identified and opened up in L-type format finding a moderate joint effusion.  Cobra retractors were placed around the medial and lateral femoral neck and a femoral neck cut was made with an oscillating saw just proximal to the lesser trochanter.  This Was completed with an osteotome and a corkscrew guide was placed in the femoral head.  The femoral head was removed in its entirety and it was found to have a very wide area devoid of cartilage.  There is significant osteophytes around the hip as well.  A bent Hohmann was then placed over the medial acetabular rim remnants of the acetabular labrum and other debris  removed.  The acetabulum had significant degenerative tearing.  Reaming was then initiated under direct visualization and direct fluoroscopy from a size 43 reamer and stepwise increments going up to a size 57 reamer with all reamers placed under direct visualization and the last reamer also under direct fluoroscopy in order to obtain the depth of reaming, the inclination  and the anteversion.  The real DePuy sector GRIPTION acetabular clinic size 58 was then placed without difficulty followed by a 36+4 polyethylene liner.  Attention was then turned to the femur.  With the left leg externally rotated to 130 degrees, extended and adducted, a Mueller retractor was placed medially and a Hohmann retractor was placed behind the greater trochanter.  The lateral joint capsule was released and a box cutting osteotome was used into the femoral canal.  Broaching was then initiated using the Actis broaching system from a size 0 going to a size 5.  With a size 5 in place we trialed a high offset femoral neck and a 36+1.5 trial hip ball.  The leg was brought over and up and with traction and internal rotation reduced in the pelvis.  Based on radiographic and clinical assessment we needed just a little bit more leg length.  The hip was then dislocated and remove the trial components.  We then placed the real Actis femoral component size 5 with high offset and the real 36+5 ceramic hip ball.  This was reduced in the pelvis and we are pleased with leg length, offset, range of motion and stability assessed radiographically and mechanically.  The soft tissue was then irrigated with normal saline solution.  Remnants of the joint capsule were closed with interrupted #1 Ethibond suture followed by #1 Vicryl to close the tensor fascia.  0 Vicryl was used to close the deep tissue and 2-0 Vicryl was used to close the subcutaneous tissue.  The skin was closed with staples.  An Aquacel dressing was applied.  The patient was taken off of the Hana table and taken to the recovery room in stable condition.  [employer]-cart PA-C did assist during the entire case and beginning to end and his assistance was medically necessary and crucial for soft tissue management and retraction, helping guide implant placement and a layered closure of the wound.

## 2023-06-25 NOTE — Anesthesia Procedure Notes (Signed)
Procedure Name: MAC Date/Time: 06/25/2023 1:23 PM  Performed by: Elisabeth Cara, CRNAPre-anesthesia Checklist: Patient identified, Emergency Drugs available, Suction available, Patient being monitored and Timeout performed Patient Re-evaluated:Patient Re-evaluated prior to induction Oxygen Delivery Method: Simple face mask Placement Confirmation: CO2 detector Dental Injury: Teeth and Oropharynx as per pre-operative assessment

## 2023-06-25 NOTE — Anesthesia Postprocedure Evaluation (Signed)
Anesthesia Post Note  Patient: Bradley [name]  Procedure(s) Performed: LEFT TOTAL HIP ARTHROPLASTY ANTERIOR APPROACH (Left: Hip)     Patient location during evaluation: PACU Anesthesia Type: Spinal Level of consciousness: awake and alert, patient cooperative and oriented Pain management: pain level controlled Vital Signs Assessment: post-procedure vital signs reviewed and stable Respiratory status: nonlabored ventilation, spontaneous breathing and respiratory function stable Cardiovascular status: blood pressure returned to baseline and stable Postop Assessment: no apparent nausea or vomiting, patient able to bend at knees, spinal receding and adequate PO intake Anesthetic complications: no   No notable events documented.  Last Vitals:  Vitals:   06/25/23 1500 06/25/23 1515  BP: (!) 94/54 98/61  Pulse: (!) 50 (!) 51  Resp: 13 15  Temp:    SpO2: 100% 100%    Last Pain:  Vitals:   06/25/23 1451  TempSrc:   PainSc: 0-No pain                 Amoreena Neubert,E. Murline Weigel

## 2023-06-25 NOTE — Transfer of Care (Signed)
Immediate Anesthesia Transfer of Care Note  Patient: Bradley [name]  Procedure(s) Performed: LEFT TOTAL HIP ARTHROPLASTY ANTERIOR APPROACH (Left: Hip)  Patient Location: PACU  Anesthesia Type:Regional and MAC combined with regional for post-op pain  Level of Consciousness: awake, alert , oriented, and patient cooperative  Airway & Oxygen Therapy: Patient Spontanous Breathing and Patient connected to face mask oxygen  Post-op Assessment: Report given to RN and Post -op Vital signs reviewed and stable  Post vital signs: Reviewed and stable  Last Vitals:  Vitals Value Taken Time  BP    Temp    Pulse    Resp 8 06/25/23 1451  SpO2    Vitals shown include unfiled device data.  Last Pain:  Vitals:   06/25/23 0949  TempSrc:   PainSc: 0-No pain         Complications: No notable events documented.

## 2023-06-25 NOTE — Anesthesia Procedure Notes (Signed)
Spinal  Patient location during procedure: OR End time: 06/25/2023 1:28 PM Reason for block: surgical anesthesia Staffing Performed by: Jairo Ben, MD Authorized by: Jairo Ben, MD   Preanesthetic Checklist Completed: patient identified, IV checked, site marked, risks and benefits discussed, surgical consent, monitors and equipment checked, pre-op evaluation and timeout performed Spinal Block Patient position: sitting Prep: DuraPrep Patient monitoring: heart rate, cardiac monitor, continuous pulse ox and blood pressure Approach: midline Location: L3-4 Injection technique: single-shot Needle Needle type: Pencan and Introducer  Needle gauge: 24 G Needle length: 9 cm Assessment Sensory level: T4 Events: CSF return Additional Notes Pt identified in Operating room.  Monitors applied. Working IV access confirmed. Sterile prep, drape lumbar spine.  1% lido local L 3,4.  #24ga Pencan into clear CSF L 3,4.  13.5 mg 0.75% Bupivacaine with dextrose injected with asp CSF beginning and end of injection.  Patient asymptomatic, VSS, no heme aspirated, tolerated well.  Sandford Craze, MD

## 2023-06-25 NOTE — Evaluation (Signed)
Physical Therapy Evaluation Patient Details Name: Bradley [name] MRN: [id number] DOB: June 15, 1956 Today's Date: 06/25/2023  History of Present Illness  67 yo male presents to therapy s/p L THA, anterior approach on 06/25/2023 due to failure of conservative measures. Pt PMH includes but is not limited to: CAD, HTN, HLD, NSTEMI, and hernia repair.  Clinical Impression    Bradley [name] is a 67 y.o. male POD 0 s/p L THA. Patient reports IND with mobility at baseline. Patient is now limited by functional impairments (see PT problem list below) and requires min guard for bed mobility and min guard and cues for transfers. Patient was able to ambulate 35 feet with RW and min guard level of assist. Patient instructed in exercise to facilitate ROM and circulation to manage edema. Patient will benefit from continued skilled PT interventions to address impairments and progress towards PLOF. Acute PT will follow to progress mobility and stair training in preparation for safe discharge home with family and social support and [facility] services.       If plan is discharge home, recommend the following: A little help with walking and/or transfers;A little help with bathing/dressing/bathroom;Assistance with cooking/housework;Assist for transportation;Help with stairs or ramp for entrance   Can travel by private vehicle        Equipment Recommendations Rolling walker (2 wheels)  Recommendations for Other Services       Functional Status Assessment Patient has had a recent decline in their functional status and demonstrates the ability to make significant improvements in function in a reasonable and predictable amount of time.     Precautions / Restrictions Precautions Precautions: Fall Restrictions Weight Bearing Restrictions: No      Mobility  Bed Mobility Overal bed mobility: Needs Assistance Bed Mobility: Supine to Sit     Supine to sit: HOB elevated, Min guard     General bed mobility comments: min  ceus    Transfers Overall transfer level: Needs assistance Equipment used: Rolling walker (2 wheels) Transfers: Sit to/from Stand Sit to Stand: Min guard, From elevated surface           General transfer comment: min cues for proper UE and AD placement. pt indicated mild light headedness with supine to sit and with sit to stand subsided and pt able to progress with gait tasks    Ambulation/Gait Ambulation/Gait assistance: Min guard Gait Distance (Feet): 35 Feet Assistive device: Rolling walker (2 wheels) Gait Pattern/deviations: Step-to pattern, Antalgic, Decreased stance time - left Gait velocity: decreased     General Gait Details: use of B UE support at RW to offload L LE in stance phase  Stairs            Wheelchair Mobility     Tilt Bed    Modified Rankin (Stroke Patients Only)       Balance Overall balance assessment: Needs assistance Sitting-balance support: Feet supported Sitting balance-Leahy Scale: Good     Standing balance support: Bilateral upper extremity supported, During functional activity, Reliant on assistive device for balance Standing balance-Leahy Scale: Poor                               Pertinent Vitals/Pain Pain Assessment Pain Assessment: 0-10 Pain Score: 2  Pain Location: L hip Pain Descriptors / Indicators: Numbness, Operative site guarding (can "feel it") Pain Intervention(s): Limited activity within patient's tolerance, Monitored during session, Premedicated before session, Repositioned, Ice applied    Home  Living Family/patient expects to be discharged to:: Private residence Living Arrangements: Alone Available Help at Discharge: Family (sister will stay with pt until 8/7) Type of Home: House Home Access: Stairs to enter Entrance Stairs-Rails: None Entrance Stairs-Number of Steps: 2   Home Layout: Two level;Able to live on main level with bedroom/bathroom Home Equipment: Standard Walker;Toilet riser       Prior Function Prior Level of Function : Independent/Modified Independent;Driving             Mobility Comments: IND with all ADLs, self care tasks and IADLs.       Hand Dominance        Extremity/Trunk Assessment        Lower Extremity Assessment Lower Extremity Assessment: LLE deficits/detail LLE Deficits / Details: ankle DF/PF 5/5 LLE Sensation: WNL (pt reported some residual abn sensation glutes once in standing and ambulatory)    Cervical / Trunk Assessment Cervical / Trunk Assessment: Normal  Communication   Communication: No difficulties  Cognition Arousal/Alertness: Awake/alert Behavior During Therapy: WFL for tasks assessed/performed Overall Cognitive Status: Within Functional Limits for tasks assessed                                          General Comments      Exercises Total Joint Exercises Ankle Circles/Pumps: AROM, Both, 20 reps   Assessment/Plan    PT Assessment Patient needs continued PT services  PT Problem List Decreased strength;Decreased range of motion;Decreased activity tolerance;Decreased balance;Decreased mobility;Decreased coordination;Pain       PT Treatment Interventions DME instruction;Gait training;Stair training;Functional mobility training;Therapeutic activities;Therapeutic exercise;Balance training;Neuromuscular re-education;Patient/family education;Modalities    PT Goals (Current goals can be found in the Care Plan section)  Acute Rehab PT Goals Patient Stated Goal: to be able to walk with my L leg straight in front, no pain and to be able to cross wendover PT Goal Formulation: With patient Time For Goal Achievement: 07/09/23 Potential to Achieve Goals: Good    Frequency 7X/week     Co-evaluation               AM-PAC PT "6 Clicks" Mobility  Outcome Measure Help needed turning from your back to your side while in a flat bed without using bedrails?: A Little Help needed moving from lying  on your back to sitting on the side of a flat bed without using bedrails?: A Little Help needed moving to and from a bed to a chair (including a wheelchair)?: A Little Help needed standing up from a chair using your arms (e.g., wheelchair or bedside chair)?: A Little Help needed to walk in hospital room?: A Little Help needed climbing 3-5 steps with a railing? : A Lot 6 Click Score: 17    End of Session Equipment Utilized During Treatment: Gait belt Activity Tolerance: Patient tolerated treatment well Patient left: in chair;with call bell/phone within reach;with family/visitor present Nurse Communication: Mobility status PT Visit Diagnosis: Unsteadiness on feet (R26.81);Other abnormalities of gait and mobility (R26.89);Muscle weakness (generalized) (M62.81);Pain;Difficulty in walking, not elsewhere classified (R26.2) Pain - Right/Left: Left Pain - part of body: Hip    Time: 6213-0865 PT Time Calculation (min) (ACUTE ONLY): 34 min   Charges:   PT Evaluation $PT Eval Low Complexity: 1 Low PT Treatments $Gait Training: 8-22 mins PT General Charges $$ ACUTE PT VISIT: 1 Visit         Johnny Bridge, PT  Acute Rehab   Jacqualyn Posey 06/25/2023, 6:35 PM

## 2023-06-26 DIAGNOSIS — M1612 Unilateral primary osteoarthritis, left hip: Secondary | ICD-10-CM | POA: Diagnosis not present

## 2023-06-26 MED ORDER — ASPIRIN 81 MG PO CHEW: 81.0000 mg | CHEWABLE_TABLET | Freq: Two times a day (BID) | ORAL | 0 refills | Status: AC

## 2023-06-26 MED ORDER — METHOCARBAMOL 500 MG PO TABS: 500.0000 mg | ORAL_TABLET | Freq: Four times a day (QID) | ORAL | 1 refills | Status: DC | PRN

## 2023-06-26 MED ORDER — OXYCODONE HCL 5 MG PO TABS: 5.0000 mg | ORAL_TABLET | Freq: Four times a day (QID) | ORAL | 0 refills | Status: DC | PRN

## 2023-06-26 NOTE — Progress Notes (Signed)
PT  TX NOTE   06/26/23 1400  PT Visit Information  Last PT Received On 06/26/23  Assistance Needed Excellent progress, meeting goals and feels ready to d/c with family assist as needed. Reviewed progression of THA HEP, steady progression of activity.   History of Present Illness 67 yo male presents to therapy s/p L THA, anterior approach on 06/25/2023 due to failure of conservative measures. Pt PMH includes but is not limited to: CAD, HTN, HLD, NSTEMI, and hernia repair.  Subjective Data  Patient Stated Goal to be able to walk with my L leg straight in front, no pain and to be able to cross wendover  Restrictions  Weight Bearing Restrictions No  Pain Assessment  Pain Assessment 0-10  Pain Score 3  Pain Location L hip  Pain Descriptors / Indicators Discomfort;Grimacing  Pain Intervention(s) Limited activity within patient's tolerance;Monitored during session;Repositioned;Ice applied  Cognition  Arousal/Alertness Awake/alert  Behavior During Therapy WFL for tasks assessed/performed  Overall Cognitive Status Within Functional Limits for tasks assessed  Bed Mobility  General bed mobility comments in recliner  Transfers  Overall transfer level Needs assistance  Equipment used Crutches  Transfers Sit to/from Stand  Sit to Stand Supervision;Modified independent (Device/Increase time)  General transfer comment cues for hand placement and position of crutches; no physical assist  Ambulation/Gait  Ambulation/Gait assistance Supervision;Modified independent (Device/Increase time)  Gait Distance (Feet) 60 Feet  Assistive device Crutches  Gait Pattern/deviations Step-through pattern;Decreased stance time - left  General Gait Details good stability with crutches  Gait velocity decreased  Stairs Yes  Stairs assistance Min guard;Supervision  Stair Management No rails;Step to pattern;With crutches;Forwards  Number of Stairs 5 (x2)  General stair comments cues for sequence and technique; sister  present for stair training  PT - End of Session  Equipment Utilized During Treatment Gait belt  Activity Tolerance Patient tolerated treatment well  Patient left with call bell/phone within reach;in bed;with bed alarm set   PT - Assessment/Plan  PT Plan Current plan remains appropriate  PT Visit Diagnosis Unsteadiness on feet (R26.81);Other abnormalities of gait and mobility (R26.89);Muscle weakness (generalized) (M62.81);Pain;Difficulty in walking, not elsewhere classified (R26.2)  Pain - Right/Left Left  Pain - part of body Hip  PT Frequency (ACUTE ONLY) 7X/week  Follow Up Recommendations Follow physician's recommendations for discharge plan and follow up therapies  Assistance recommended at discharge Intermittent Supervision/Assistance  Patient can return home with the following A little help with walking and/or transfers;A little help with bathing/dressing/bathroom;Assistance with cooking/housework;Assist for transportation;Help with stairs or ramp for entrance  PT equipment Rolling walker (2 wheels)  AM-PAC PT "6 Clicks" Mobility Outcome Measure (Version 2)  Help needed turning from your back to your side while in a flat bed without using bedrails? 3  Help needed moving from lying on your back to sitting on the side of a flat bed without using bedrails? 3  Help needed moving to and from a bed to a chair (including a wheelchair)? 3  Help needed standing up from a chair using your arms (e.g., wheelchair or bedside chair)? 3  Help needed to walk in hospital room? 3  Help needed climbing 3-5 steps with a railing?  3  6 Click Score 18  Consider Recommendation of Discharge To: Home with [facility]  PT Goal Progression  Progress towards PT goals Progressing toward goals  Acute Rehab PT Goals  PT Goal Formulation With patient  Time For Goal Achievement 07/09/23  Potential to Achieve Goals Good  PT Time  Calculation  PT Start Time (ACUTE ONLY) 1340  PT Stop Time (ACUTE ONLY) 1403  PT Time  Calculation (min) (ACUTE ONLY) 23 min  PT General Charges  $$ ACUTE PT VISIT 1 Visit  PT Treatments  $Gait Training 8-22 mins

## 2023-06-26 NOTE — Progress Notes (Signed)
Orthopedic Tech Progress Note Patient Details:  Bradley [name] July 22, 1956 [id number] Gave patient instructions on how to use crutches per order from the PT. Ortho Devices Type of Ortho Device: Crutches Ortho Device/Splint Interventions: Application, Adjustment   Post Interventions Patient Tolerated: Well Instructions Provided: Adjustment of device, Care of device  Blase Mess 06/26/2023, 1:35 PM

## 2023-06-26 NOTE — Discharge Summary (Signed)
Patient ID: Bradley [name] MRN: [id number] DOB/AGE: Mar 09, 1956 67 y.o.  Admit date: 06/25/2023 Discharge date: 06/26/2023  Admission Diagnoses:  Principal Problem:   Primary osteoarthritis of left hip Active Problems:   Status post total replacement of left hip   Discharge Diagnoses:  Same  Past Medical History:  Diagnosis Date   Arthritis    CAD in native artery    Hypertension    LV dysfunction    EF post MI 35-40%   STEMI (ST elevation myocardial infarction) (HCC)    ant wall,  Xience alpine to LAD      Surgeries: Procedure(s): LEFT TOTAL HIP ARTHROPLASTY ANTERIOR APPROACH on 06/25/2023   Consultants:   Discharged Condition: Improved  Hospital Course: Bradley [name] is an 67 y.o. male who was admitted 06/25/2023 for operative treatment ofPrimary osteoarthritis of left hip. Patient has severe unremitting pain that affects sleep, daily activities, and work/hobbies. After pre-op clearance the patient was taken to the operating room on 06/25/2023 and underwent  Procedure(s): LEFT TOTAL HIP ARTHROPLASTY ANTERIOR APPROACH.    Patient was given perioperative antibiotics:  Anti-infectives (From admission, onward)    Start     Dose/Rate Route Frequency Ordered Stop   06/25/23 2000  ceFAZolin (ANCEF) IVPB 1 g/50 mL premix        1 g 100 mL/hr over 30 Minutes Intravenous Every 6 hours 06/25/23 1701 06/26/23 0330   06/25/23 0930  ceFAZolin (ANCEF) IVPB 2g/100 mL premix        2 g 200 mL/hr over 30 Minutes Intravenous On call to O.R. 06/25/23 1324 06/25/23 1359        Patient was given sequential compression devices, early ambulation, and chemoprophylaxis to prevent DVT.  Patient benefited maximally from hospital stay and there were no complications.    Recent vital signs: Patient Vitals for the past 24 hrs:  BP Temp Temp src Pulse Resp SpO2 Height Weight  06/26/23 0949 121/63 98.2 F (36.8 C) Oral 66 15 100 % -- --  06/26/23 0900 116/66 -- -- -- -- -- -- --  06/26/23 0556  121/72 98.3 F (36.8 C) -- 60 16 99 % -- --  06/26/23 0209 123/72 97.6 F (36.4 C) Oral (!) 58 18 100 % -- --  06/25/23 2116 125/79 97.9 F (36.6 C) Oral 62 18 100 % -- --  06/25/23 1853 128/70 97.8 F (36.6 C) Oral (!) 55 18 100 % -- --  06/25/23 1650 117/78 98 F (36.7 C) Oral (!) 50 16 100 % 5' 10.87" (1.8 m) 78.7 kg  06/25/23 1630 115/76 97.8 F (36.6 C) -- (!) 55 15 100 % -- --  06/25/23 1615 115/69 -- -- (!) 53 14 100 % -- --  06/25/23 1600 120/74 -- -- (!) 57 13 100 % -- --  06/25/23 1545 107/86 -- -- (!) 49 15 99 % -- --  06/25/23 1530 100/63 -- -- (!) 56 19 100 % -- --  06/25/23 1515 98/61 -- -- (!) 51 15 100 % -- --  06/25/23 1500 (!) 94/54 -- -- (!) 50 13 100 % -- --  06/25/23 1451 105/60 (!) 97.4 F (36.3 C) -- (!) 50 12 100 % -- --     Recent laboratory studies:  Recent Labs    06/26/23 0403  WBC 14.8*  HGB 12.2*  HCT 37.0*  PLT 146*  NA 136  K 4.2  CL 105  CO2 25  BUN 15  CREATININE 0.87  GLUCOSE 131*  CALCIUM  8.3*     Discharge Medications:   Allergies as of 06/26/2023   No Known Allergies      Medication List     STOP taking these medications    aspirin EC 81 MG tablet Replaced by: aspirin 81 MG chewable tablet       TAKE these medications    aspirin 81 MG chewable tablet Chew 1 tablet (81 mg total) by mouth 2 (two) times daily. Replaces: aspirin EC 81 MG tablet   atorvastatin 80 MG tablet Commonly known as: LIPITOR Take 1 tablet (80 mg total) by mouth daily.   Co Q-10 100 MG Caps Take 100 mg by mouth 2 (two) times daily.   lisinopril 2.5 MG tablet Commonly known as: ZESTRIL Take 1 tablet (2.5 mg total) by mouth daily.   methocarbamol 500 MG tablet Commonly known as: ROBAXIN Take 1 tablet (500 mg total) by mouth every 6 (six) hours as needed for muscle spasms.   metoprolol succinate 25 MG 24 hr tablet Commonly known as: TOPROL-XL TAKE 1/2 TABLET(12.5 MG) BY MOUTH DAILY   naproxen sodium 220 MG tablet Commonly known as:  ALEVE Take 220 mg by mouth daily as needed (pain).   nitroGLYCERIN 0.4 MG SL tablet Commonly known as: NITROSTAT DISSOLVE 1 TABLET UNDER THE TONGUE UNDER THE TONGUE EVERY 5 MINUTES AS NEEDED FOR CHEST PAIN   oxyCODONE 5 MG immediate release tablet Commonly known as: Oxy IR/ROXICODONE Take 1-2 tablets (5-10 mg total) by mouth every 6 (six) hours as needed for moderate pain (pain score 4-6).               Durable Medical Equipment  (From admission, onward)           Start     Ordered   06/25/23 1702  DME 3 n 1  Once        06/25/23 1701   06/25/23 1702  DME Walker rolling  Once       Question Answer Comment  Walker: With 5 Inch Wheels   Patient needs a walker to treat with the following condition Status post total replacement of left hip      06/25/23 1701            Diagnostic Studies: DG Pelvis Portable  Result Date: 06/25/2023 CLINICAL DATA:  Status post left hip replacement. EXAM: PORTABLE PELVIS 1-2 VIEWS COMPARISON:  Preoperative exam 05/05/2023 FINDINGS: Left hip arthroplasty in expected alignment. No periprosthetic lucency or fracture. Recent postsurgical change includes air and edema in the soft tissues. Lateral skin staples in place. Previous right femoral head arthroplasty in place. IMPRESSION: Left hip arthroplasty without immediate postoperative complication. Electronically Signed   By: Narda Rutherford M.D.   On: 06/25/2023 15:48   DG HIP UNILAT WITH PELVIS 1V LEFT  Result Date: 06/25/2023 CLINICAL DATA:  Elective surgery. EXAM: DG HIP (WITH OR WITHOUT PELVIS) 1V*L* COMPARISON:  Preoperative radiograph FINDINGS: Two fluoroscopic spot views of the pelvis and left hip obtained in the operating room. Images post left hip arthroplasty. Fluoroscopy time 1 minute 6 seconds. Dose 2.112 mGy. IMPRESSION: Intraoperative fluoroscopy during left hip arthroplasty. Electronically Signed   By: Narda Rutherford M.D.   On: 06/25/2023 15:48   DG C-Arm 1-60 Min-No  Report  Result Date: 06/25/2023 Fluoroscopy was utilized by the requesting physician.  No radiographic interpretation.    Disposition: Discharge disposition: 01-Home or Self Care          Follow-up Information     Doneen Poisson  Y, MD Follow up in 2 week(s).   Specialty: Orthopedic Surgery Contact information: [address] [place] [address] [phone]                  Signed: Kathryne Hitch 06/26/2023, 11:18 AM

## 2023-06-26 NOTE — Discharge Instructions (Signed)

## 2023-06-26 NOTE — Progress Notes (Signed)
Physical Therapy Treatment Patient Details Name: Bradley [name] MRN: [id number] DOB: June 18, 1956 Today's Date: 06/26/2023   History of Present Illness 67 yo male presents to therapy s/p L THA, anterior approach on 06/25/2023 due to failure of conservative measures. Pt PMH includes but is not limited to: CAD, HTN, HLD, NSTEMI, and hernia repair.    PT Comments  Pt progressing well. Will see for a second session to review stairs; pt Is motivated to d/c today    If plan is discharge home, recommend the following: A little help with walking and/or transfers;A little help with bathing/dressing/bathroom;Assistance with cooking/housework;Assist for transportation;Help with stairs or ramp for entrance   Can travel by private vehicle        Equipment Recommendations  Rolling walker (2 wheels)    Recommendations for Other Services       Precautions / Restrictions Restrictions Weight Bearing Restrictions: No     Mobility  Bed Mobility Overal bed mobility: Needs Assistance Bed Mobility: Sit to Supine       Sit to supine: Min guard   General bed mobility comments: able to use leg lifter with cues    Transfers Overall transfer level: Needs assistance Equipment used: Rolling walker (2 wheels) Transfers: Sit to/from Stand Sit to Stand: Supervision, Min guard           General transfer comment: cues for hand placement    Ambulation/Gait Ambulation/Gait assistance: Min guard, Supervision Gait Distance (Feet): 100 Feet Assistive device: Rolling walker (2 wheels) Gait Pattern/deviations: Step-through pattern, Decreased stance time - left Gait velocity: decreased     General Gait Details: cues for sequence and progression to step through. good stability, 2 standing breaks d/t pain   Stairs             Wheelchair Mobility     Tilt Bed    Modified Rankin (Stroke Patients Only)       Balance                                            Cognition  Arousal/Alertness: Awake/alert Behavior During Therapy: WFL for tasks assessed/performed Overall Cognitive Status: Within Functional Limits for tasks assessed                                          Exercises Total Joint Exercises Ankle Circles/Pumps: AROM, Both, 5 reps Quad Sets: AROM, 5 reps, Left Gluteal Sets: Strengthening, Both, 5 reps Heel Slides: AAROM, Limitations Heel Slides Limitations: 3 reps, ltd by pain Hip ABduction/ADduction: AROM, Left (3 reps)    General Comments        Pertinent Vitals/Pain Pain Assessment Pain Assessment: 0-10 Pain Score: 5  Pain Location: L hip Pain Descriptors / Indicators: Discomfort, Grimacing Pain Intervention(s): Limited activity within patient's tolerance, Monitored during session, Premedicated before session, Repositioned    Home Living                          Prior Function            PT Goals (current goals can now be found in the care plan section) Acute Rehab PT Goals Patient Stated Goal: to be able to walk with my L leg straight in front, no pain and to  be able to cross [place] PT Goal Formulation: With patient Time For Goal Achievement: 07/09/23 Potential to Achieve Goals: Good Progress towards PT goals: Progressing toward goals    Frequency    7X/week      PT Plan Current plan remains appropriate    Co-evaluation              AM-PAC PT "6 Clicks" Mobility   Outcome Measure  Help needed turning from your back to your side while in a flat bed without using bedrails?: A Little Help needed moving from lying on your back to sitting on the side of a flat bed without using bedrails?: A Little Help needed moving to and from a bed to a chair (including a wheelchair)?: A Little Help needed standing up from a chair using your arms (e.g., wheelchair or bedside chair)?: A Little Help needed to walk in hospital room?: A Little Help needed climbing 3-5 steps with a railing? : A  Little 6 Click Score: 18    End of Session Equipment Utilized During Treatment: Gait belt Activity Tolerance: Patient tolerated treatment well Patient left: with call bell/phone within reach;in bed;with bed alarm set   PT Visit Diagnosis: Unsteadiness on feet (R26.81);Other abnormalities of gait and mobility (R26.89);Muscle weakness (generalized) (M62.81);Pain;Difficulty in walking, not elsewhere classified (R26.2) Pain - Right/Left: Left Pain - part of body: Hip     Time: 1610-9604 PT Time Calculation (min) (ACUTE ONLY): 32 min  Charges:    $Gait Training: 23-37 mins PT General Charges $$ ACUTE PT VISIT: 1 Visit                     Graceanna Theissen, PT  Acute Rehab Dept [facility]) [phone]  06/26/2023    [facility] 06/26/2023, 11:46 AM

## 2023-06-26 NOTE — Progress Notes (Signed)
Subjective: 1 Day Post-Op Procedure(s) (LRB): LEFT TOTAL HIP ARTHROPLASTY ANTERIOR APPROACH (Left) Patient reports pain as moderate.  Up working with therapy.  Objective: Vital signs in last 24 hours: Temp:  [97.4 F (36.3 C)-98.3 F (36.8 C)] 98.2 F (36.8 C) (08/03 0949) Pulse Rate:  [49-66] 66 (08/03 0949) Resp:  [12-19] 15 (08/03 0949) BP: (94-128)/(54-86) 121/63 (08/03 0949) SpO2:  [99 %-100 %] 100 % (08/03 0949) Weight:  [78.7 kg] 78.7 kg (08/02 1650)  Intake/Output from previous day: 08/02 0701 - 08/03 0700 In: 3821.2 [P.O.:1090; I.V.:2431.2; IV Piggyback:300] Out: 2025 [Urine:1725; Blood:300] Intake/Output this shift: Total I/O In: 240 [P.O.:240] Out: 300 [Urine:300]  Recent Labs    06/26/23 0403  HGB 12.2*   Recent Labs    06/26/23 0403  WBC 14.8*  RBC 3.60*  HCT 37.0*  PLT 146*   Recent Labs    06/26/23 0403  NA 136  K 4.2  CL 105  CO2 25  BUN 15  CREATININE 0.87  GLUCOSE 131*  CALCIUM 8.3*   No results for input(s): "LABPT", "INR" in the last 72 hours.  Sensation intact distally Intact pulses distally Dorsiflexion/Plantar flexion intact Incision: dressing C/D/I   Assessment/Plan: 1 Day Post-Op Procedure(s) (LRB): LEFT TOTAL HIP ARTHROPLASTY ANTERIOR APPROACH (Left) Up with therapy Discharge home with home health this afternoon.      Kathryne Hitch 06/26/2023, 10:54 AM

## 2023-06-26 NOTE — TOC Transition Note (Addendum)
Transition of Care [facility]) - CM/SW Discharge Note   Patient Details  Name: Bradley [name] MRN: [id number] Date of Birth: March 22, 1956  Transition of Care [facility]) CM/SW Contact:  Georgie Chard, LCSW Phone Number: 06/26/2023, 12:38 PM   Clinical Narrative:    Addend @ 3:23   DME ordered canceled per patient's current RN [facility] provided DME to patient to the patient's room. At this time there are no further TOC needs and patient will DC home.    CSW has spoke with [employer] due to insurance they will only pay for one DME. At this time the patient has chose to go with crutches. This CSW has ordered and will be delivered to patient's room. Patient will DC and there are no further TOC needs at this time.           Patient Goals and CMS Choice      Discharge Placement                         Discharge Plan and Services Additional resources added to the After Visit Summary for                                       Social Determinants of Health (SDOH) Interventions SDOH Screenings   Food Insecurity: No Food Insecurity (06/25/2023)  Housing: Low Risk  (06/25/2023)  Transportation Needs: No Transportation Needs (06/25/2023)  Utilities: Not At Risk (06/25/2023)  Alcohol Screen: Low Risk  (04/06/2023)  Depression (PHQ2-9): Low Risk  (04/07/2023)  Financial Resource Strain: Low Risk  (04/06/2023)  Physical Activity: Insufficiently Active (04/06/2023)  Social Connections: Socially Integrated (04/06/2023)  Stress: No Stress Concern Present (04/06/2023)  Tobacco Use: Low Risk  (06/25/2023)     Readmission Risk Interventions     No data to display

## 2023-06-27 DIAGNOSIS — Z9181 History of falling: Secondary | ICD-10-CM | POA: Diagnosis not present

## 2023-06-27 DIAGNOSIS — Z471 Aftercare following joint replacement surgery: Secondary | ICD-10-CM | POA: Diagnosis not present

## 2023-06-27 DIAGNOSIS — M2170 Unequal limb length (acquired), unspecified site: Secondary | ICD-10-CM | POA: Diagnosis not present

## 2023-06-27 DIAGNOSIS — Z96642 Presence of left artificial hip joint: Secondary | ICD-10-CM | POA: Diagnosis not present

## 2023-06-27 DIAGNOSIS — Z9089 Acquired absence of other organs: Secondary | ICD-10-CM | POA: Diagnosis not present

## 2023-06-27 DIAGNOSIS — I2510 Atherosclerotic heart disease of native coronary artery without angina pectoris: Secondary | ICD-10-CM | POA: Diagnosis not present

## 2023-06-27 DIAGNOSIS — E785 Hyperlipidemia, unspecified: Secondary | ICD-10-CM | POA: Diagnosis not present

## 2023-06-27 DIAGNOSIS — I255 Ischemic cardiomyopathy: Secondary | ICD-10-CM | POA: Diagnosis not present

## 2023-06-27 DIAGNOSIS — F1010 Alcohol abuse, uncomplicated: Secondary | ICD-10-CM | POA: Diagnosis not present

## 2023-06-27 DIAGNOSIS — Z7982 Long term (current) use of aspirin: Secondary | ICD-10-CM | POA: Diagnosis not present

## 2023-06-27 DIAGNOSIS — M1611 Unilateral primary osteoarthritis, right hip: Secondary | ICD-10-CM | POA: Diagnosis not present

## 2023-06-27 DIAGNOSIS — I119 Hypertensive heart disease without heart failure: Secondary | ICD-10-CM | POA: Diagnosis not present

## 2023-06-27 DIAGNOSIS — I252 Old myocardial infarction: Secondary | ICD-10-CM | POA: Diagnosis not present

## 2023-06-28 ENCOUNTER — Encounter (HOSPITAL_COMMUNITY): Payer: Self-pay | Admitting: Orthopaedic Surgery

## 2023-06-29 ENCOUNTER — Telehealth: Payer: Self-pay | Admitting: *Deleted

## 2023-06-29 NOTE — Telephone Encounter (Signed)
Ortho bundle D/C call completed. 

## 2023-07-05 ENCOUNTER — Telehealth: Payer: Self-pay

## 2023-07-05 NOTE — Telephone Encounter (Signed)
Bradley [name], with [facility] called stating that patient's left hip incision looks good and that she replaced Aquacel dressing due to patient not feeling comfortable leaving incision open to air.  Patient has left [facility] on 06/25/2023.  Cb# [phone].

## 2023-07-08 ENCOUNTER — Ambulatory Visit (INDEPENDENT_AMBULATORY_CARE_PROVIDER_SITE_OTHER): Payer: PPO | Admitting: Orthopaedic Surgery

## 2023-07-08 ENCOUNTER — Encounter: Payer: Self-pay | Admitting: Orthopaedic Surgery

## 2023-07-08 DIAGNOSIS — Z96642 Presence of left artificial hip joint: Secondary | ICD-10-CM

## 2023-07-08 NOTE — Progress Notes (Signed)
The patient comes in today for his first postoperative visit status post a left total hip arthroplasty.  He reports good range of motion and strength and said he is doing well.  He is ambulating with 1 crutch.  He was on 1 baby aspirin prior to surgery and we increase this to 2.  He can go back to just once daily.  His left hip incision looks good.  The staples have been removed and Steri-Strips applied.  He does have a moderate seroma and I withdrew about 55 cc of fluid from the hip.  He understands this may reaccumulate and if it becomes uncomfortable he can call to be seen earlier.  If not we will see him back in 4 weeks to see how he is doing overall.  He can get on his bike again in another week and knows to wait at least 2-3 more weeks before submerging underwater.

## 2023-07-14 ENCOUNTER — Telehealth: Payer: Self-pay

## 2023-07-14 NOTE — Telephone Encounter (Signed)
Patient called stating that he is having drainage and swelling again close to his left hip incision.  Patient had left [facility] 06/25/2023.  Would like to be worked in to be seen?  CB# [phone].  Please advise.  Thank you.

## 2023-07-14 NOTE — Telephone Encounter (Signed)
Patient has been scheduled.  Thank you.

## 2023-07-15 ENCOUNTER — Ambulatory Visit (INDEPENDENT_AMBULATORY_CARE_PROVIDER_SITE_OTHER): Payer: PPO | Admitting: Orthopaedic Surgery

## 2023-07-15 DIAGNOSIS — Z96642 Presence of left artificial hip joint: Secondary | ICD-10-CM

## 2023-07-15 NOTE — Progress Notes (Signed)
The patient will be 3 weeks tomorrow status post a left total hip replacement.  We saw him last week for staple removal.  He is developed a seroma.  He has been back on his spin cycle as well.  He is doing well overall.  He does have a moderate seroma at his left hip incision.  The incision today looks great.  There is no redness.  I was able to aspirate about 40 cc of seroma fluid from the hip.  He will continue his activities as he tolerates and can try heat.  If this reaccumulates and becomes uncomfortable he can come back and see [place] sooner.  If not he will keep his regular follow-up appointment in a few weeks.  No x-rays are needed.

## 2023-08-09 ENCOUNTER — Ambulatory Visit (INDEPENDENT_AMBULATORY_CARE_PROVIDER_SITE_OTHER): Payer: PPO | Admitting: Orthopaedic Surgery

## 2023-08-09 ENCOUNTER — Encounter: Payer: Self-pay | Admitting: Orthopaedic Surgery

## 2023-08-09 DIAGNOSIS — Z96642 Presence of left artificial hip joint: Secondary | ICD-10-CM

## 2023-08-09 NOTE — Progress Notes (Signed)
The patient is here at 6 weeks status post a left total hip replacement.  He does have a right hip resurfacing arthroplasty was done years ago.  He reports improved range of motion and strength and just a little bit of swelling.  Overall he is doing well.  He is walking without assistive device and with a minimal limp.  His right hip resurfacing arthroplasty most smoothly and fluidly.  The left hip that we replaced 6 months ago still has a little bit of stiffness but overall looks good.  The incision looks good and there is some mild swelling.  He has a regular bicyclist.  He can get back on his trainer and get back to regular riding and slowly increase his endurance and time on the bike as comfort allows.  From my standpoint, we will see him back in 6 months with a standing low AP pelvis and lateral of his more recent left operative hip.  All questions concerns were addressed and answered.

## 2023-09-07 DIAGNOSIS — L538 Other specified erythematous conditions: Secondary | ICD-10-CM | POA: Diagnosis not present

## 2023-09-07 DIAGNOSIS — L814 Other melanin hyperpigmentation: Secondary | ICD-10-CM | POA: Diagnosis not present

## 2023-09-07 DIAGNOSIS — Z789 Other specified health status: Secondary | ICD-10-CM | POA: Diagnosis not present

## 2023-09-07 DIAGNOSIS — D1801 Hemangioma of skin and subcutaneous tissue: Secondary | ICD-10-CM | POA: Diagnosis not present

## 2023-09-07 DIAGNOSIS — L820 Inflamed seborrheic keratosis: Secondary | ICD-10-CM | POA: Diagnosis not present

## 2023-09-07 DIAGNOSIS — L918 Other hypertrophic disorders of the skin: Secondary | ICD-10-CM | POA: Diagnosis not present

## 2023-09-07 DIAGNOSIS — R208 Other disturbances of skin sensation: Secondary | ICD-10-CM | POA: Diagnosis not present

## 2023-09-07 DIAGNOSIS — L821 Other seborrheic keratosis: Secondary | ICD-10-CM | POA: Diagnosis not present

## 2023-10-18 ENCOUNTER — Encounter: Payer: Self-pay | Admitting: Family Medicine

## 2023-10-25 ENCOUNTER — Encounter: Payer: Self-pay | Admitting: Orthopaedic Surgery

## 2023-12-22 ENCOUNTER — Encounter: Payer: Self-pay | Admitting: Family Medicine

## 2023-12-22 DIAGNOSIS — Z1211 Encounter for screening for malignant neoplasm of colon: Secondary | ICD-10-CM

## 2024-01-06 ENCOUNTER — Encounter: Payer: Self-pay | Admitting: Family Medicine

## 2024-01-06 ENCOUNTER — Ambulatory Visit (INDEPENDENT_AMBULATORY_CARE_PROVIDER_SITE_OTHER): Payer: PPO | Admitting: Family Medicine

## 2024-01-06 VITALS — BP 124/76 | HR 58 | Temp 98.1°F | Ht 70.25 in | Wt 177.8 lb

## 2024-01-06 DIAGNOSIS — Z0000 Encounter for general adult medical examination without abnormal findings: Secondary | ICD-10-CM | POA: Diagnosis not present

## 2024-01-06 DIAGNOSIS — Z1211 Encounter for screening for malignant neoplasm of colon: Secondary | ICD-10-CM

## 2024-01-06 DIAGNOSIS — Z1329 Encounter for screening for other suspected endocrine disorder: Secondary | ICD-10-CM | POA: Diagnosis not present

## 2024-01-06 DIAGNOSIS — D179 Benign lipomatous neoplasm, unspecified: Secondary | ICD-10-CM | POA: Diagnosis not present

## 2024-01-06 DIAGNOSIS — Z125 Encounter for screening for malignant neoplasm of prostate: Secondary | ICD-10-CM

## 2024-01-06 DIAGNOSIS — E785 Hyperlipidemia, unspecified: Secondary | ICD-10-CM | POA: Diagnosis not present

## 2024-01-06 DIAGNOSIS — Z131 Encounter for screening for diabetes mellitus: Secondary | ICD-10-CM | POA: Diagnosis not present

## 2024-01-06 DIAGNOSIS — I10 Essential (primary) hypertension: Secondary | ICD-10-CM | POA: Diagnosis not present

## 2024-01-06 NOTE — Progress Notes (Signed)
Subjective:  Patient ID: Bradley [name], male    DOB: [birth year]  Age: 68 y.o. MRN: [id number]  CC:  Chief Complaint  Patient presents with   Annual Exam    Pt is well, would just like two small lumps checked on one on his Rt side rib cage present approximately 2 years no changes in that time, and under the Lt arm present a few months no changes, neither has had any pain or discharge        HPI Bradley [name] presents for Annual Exam  PCP, me Ortho, Dr. Magnus Ivan, status post left hip replacement 06/25/2023. Doing well.  Dermatology, Dr. Melida Quitter, seborrheic keratoses, office visit October 2024.  As above he has had some lumps on his right side of his rib cage for approximately 2 years without changes in 1 under left arm for few months without changes.no redness, discharge, or changes.  Annual visit planned with derm.  Cardiology, Dr. Antoine Poche, cardiology preop visit July 2024.  CAD with anterior MI in 2016.  Stenting of proximal LAD, nonobstructive disease in RCA.  Previous EF reduced to use at 45% and follow-up at 50 to 55%.  Continued on aspirin 81 mg daily, Lipitor 80 mg daily, co-Q10, lisinopril 2.5 mg daily, metoprolol 25 mg daily, nitroglycerin as needed.  Hypertension controlled on regimen at that time and LDL 44.  Euvolemic on exam with history of HFrEF.  1 year follow-up planned with Dr. Antoine Poche.  Echo May 2022.  Low normal EF, mild AI and AS but no change in therapy.  Has learned how to SCUBA dive, qualified and [place].   Hypertension: As above, lisinopril 2.5 mg daily, Toprol-XL 25 mg daily.  Aspirin 81 mg daily with CAD. No CP/DOE or side effects with meds.  Home readings: BP Readings from Last 3 Encounters:  01/06/24 124/76  06/26/23 112/63  06/22/23 124/64   Lab Results  Component Value Date   CREATININE 1.18 01/06/2024   Hyperlipidemia: Lipitor 80 mg daily with co-Q10.  Due for updated labs. No new myalgias or side effects.  Lab Results  Component Value Date    CHOL 82 01/06/2024   HDL 19.90 (L) 01/06/2024   LDLCALC 42 01/06/2024   TRIG 103.0 01/06/2024   CHOLHDL 4 01/06/2024   Lab Results  Component Value Date   ALT 24 01/06/2024   AST 24 01/06/2024   ALKPHOS 49 01/06/2024   BILITOT 0.3 01/06/2024   Lab Results  Component Value Date   HGBA1C 5.3 01/06/2024        01/06/2024    3:16 PM 04/07/2023   11:06 AM 03/10/2023   11:42 AM 11/09/2022    1:20 PM 06/17/2022    3:21 PM  Depression screen PHQ 2/9  Decreased Interest 0 0 0 0 0  Down, Depressed, Hopeless 0 0 0 0 0  PHQ - 2 Score 0 0 0 0 0  Altered sleeping 0 2 2 2    Tired, decreased energy 0 2 0 1   Change in appetite 0  0 0   Feeling bad or failure about yourself  0 0 0 0   Trouble concentrating 0 0 0 0   Moving slowly or fidgety/restless 0 0 0 0   Suicidal thoughts 0 0 0 0   PHQ-9 Score 0 4 2 3    Difficult doing work/chores  Not difficult at all Not difficult at all      Health Maintenance  Topic Date Due   COVID-19 Vaccine (4 -  2024-25 season) 12/10/2023   Fecal DNA (Cologuard)  12/24/2023   Medicare Annual Wellness (AWV)  03/09/2024   DTaP/Tdap/Td (2 - Td or Tdap) 04/14/2025   Pneumonia Vaccine 49+ Years old  Completed   INFLUENZA VACCINE  Completed   Hepatitis C Screening  Completed   Zoster Vaccines- Shingrix  Completed   HPV VACCINES  Aged Out  Colon cancer screening, no family history of colon cancer, no personal history of polyps or bleeding.  Cologuard ordered January 29, previous testing in January 2022 with negative Cologuard at that time. Prostate: does not  have family history of prostate cancer The natural history of prostate cancer and ongoing controversy regarding screening and potential treatment outcomes of prostate cancer has been discussed with the patient. The meaning of a false positive PSA and a false negative PSA has been discussed. He indicates understanding of the limitations of this screening test and wishes  to proceed with screening PSA  testing. Lab Results  Component Value Date   PSA 1.94 01/06/2024     Immunization History  Administered Date(s) Administered   Fluad Quad(high Dose 65+) 10/18/2019, 09/27/2020   Influenza, High Dose Seasonal PF 10/21/2022   Influenza-Unspecified 11/07/2021, 09/29/2022, 10/15/2023   PFIZER(Purple Top)SARS-COV-2 Vaccination 10/15/2023   PNEUMOCOCCAL CONJUGATE-20 11/09/2022   Pfizer Covid-19 Vaccine Bivalent Booster 72yrs & up 09/29/2022, 10/21/2022   Tdap 04/15/2015   Zoster Recombinant(Shingrix) 02/19/2022   Zoster, Unspecified 09/16/2022  Shingrix at pharmacy Rsv vaccine -  option discussed.   No results found. Optho - appt in past year.   Dental: every 6 months.   Alcohol: wine - 4 per week.   Tobacco: none.   Exercise: 4-6 days per week. Over per week.   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   Coronary artery disease involving native coronary artery of native heart without angina pectoris 11/01/2019   Essential hypertension 11/01/2019   Educated about COVID-19 virus infection 11/01/2019   CAD S/P LAD DES 08/14/2015   Dyslipidemia 08/14/2015   Acute ST elevation myocardial infarction (STEMI) involving left anterior descending coronary artery (HCC) 08/02/2015   Cardiomyopathy, ischemic:  08/02/2015   Family history of early CAD 05/30/2015   Right inguinal hernia 10/09/2013   Arthropathy of pelvic region and thigh 04/29/2009   Pain in limb 08/30/2007   UNEQUAL LEG LENGTH, ACQUIRED 08/30/2007   Past Medical History:  Diagnosis Date   Arthritis    CAD in native artery    Hypertension    LV dysfunction    EF post MI 35-40%   STEMI (ST elevation myocardial infarction) (HCC)    ant wall,  Xience alpine to LAD     Past Surgical  History:  Procedure Laterality Date   CARDIAC CATHETERIZATION N/A 08/01/2015   Procedure: Left Heart Cath and Coronary Angiography;  Surgeon: Marykay Lex, MD;  Location: [facility] INVASIVE CV LAB;  Service: Cardiovascular;  Laterality: N/A;   CARDIAC CATHETERIZATION N/A 08/01/2015   Procedure: Coronary Stent Intervention;  Surgeon: Marykay Lex, MD;  Location: [facility] INVASIVE CV LAB;  Service: Cardiovascular;  Laterality: N/A;   HERNIA REPAIR     HIP RESURFACING Right    INGUINAL HERNIA REPAIR Right 10/23/2013   Procedure: HERNIA  REPAIR INGUINAL ADULT;  Surgeon: Shelly Rubenstein, MD;  Location: [facility] OR;  Service: General;  Laterality: Right;   INGUINAL HERNIA REPAIR Left 12/24/2016   Procedure: LEFT INGUINAL HERNIA REPAIR;  Surgeon: Abigail Miyamoto, MD;  Location: [facility] OR;  Service: General;  Laterality: Left;   INSERTION OF MESH Right 10/23/2013   Procedure: INSERTION OF MESH;  Surgeon: Shelly Rubenstein, MD;  Location: [facility] OR;  Service: General;  Laterality: Right;   INSERTION OF MESH Left 12/24/2016   Procedure: INSERTION OF MESH;  Surgeon: Abigail Miyamoto, MD;  Location: [facility] OR;  Service: General;  Laterality: Left;   REPAIR ANKLE LIGAMENT     TONSILLECTOMY     TOTAL HIP ARTHROPLASTY Left 06/25/2023   Procedure: LEFT TOTAL HIP ARTHROPLASTY ANTERIOR APPROACH;  Surgeon: Kathryne Hitch, MD;  Location: [facility] ORS;  Service: Orthopedics;  Laterality: Left;   No Known Allergies Prior to Admission medications   Medication Sig Start Date End Date Taking? Authorizing Provider  aspirin 81 MG chewable tablet Chew 1 tablet (81 mg total) by mouth 2 (two) times daily. 06/26/23  Yes Kathryne Hitch, MD  atorvastatin (LIPITOR) 80 MG tablet Take 1 tablet (80 mg total) by mouth daily. 06/22/23  Yes Conte, Tessa N, PA-C  Coenzyme Q10 (CO Q-10) 100 MG CAPS Take 100 mg by mouth 2 (two) times daily.   Yes [provider]  COMIRNATY syringe  10/15/23  Yes [provider]  FLUAD 0.5 ML  injection  10/15/23  Yes [provider]  lisinopril (ZESTRIL) 2.5 MG tablet Take 1 tablet (2.5 mg total) by mouth daily. 06/22/23  Yes Conte, Tessa N, PA-C  methocarbamol (ROBAXIN) 500 MG tablet Take 1 tablet (500 mg total) by mouth every 6 (six) hours as needed for muscle spasms. 06/26/23  Yes Kathryne Hitch, MD  metoprolol succinate (TOPROL-XL) 25 MG 24 hr tablet TAKE 1/2 TABLET(12.5 MG) BY MOUTH DAILY 06/22/23  Yes Asa Lente, Tessa N, PA-C  naproxen sodium (ALEVE) 220 MG tablet Take 220 mg by mouth daily as needed (pain).   Yes [provider]  nitroGLYCERIN (NITROSTAT) 0.4 MG SL tablet DISSOLVE 1 TABLET UNDER THE TONGUE UNDER THE TONGUE EVERY 5 MINUTES AS NEEDED FOR CHEST PAIN 06/22/23  Yes Sharlene Dory, PA-C   Social History   Socioeconomic History   Marital status: Married    Spouse name: Not on file   Number of children: 4   Years of education: Not on file   Highest education level: Bachelor's degree (e.g., BA, AB, BS)  Occupational History   Occupation: Radiation protection practitioner  Tobacco Use   Smoking status: Never   Smokeless tobacco: Never  Vaping Use   Vaping status: Never Used  Substance and Sexual Activity   Alcohol use: Yes    Alcohol/week: 4.0 standard drinks of alcohol    Types: 4 Glasses of wine per week   Drug use: No   Sexual activity: Not Currently  Other Topics Concern   Not on file  Social History Narrative   Lives at home with wife.    Social Drivers of Corporate investment banker Strain: Low Risk  (01/03/2024)   Overall Financial Resource Strain (CARDIA)    Difficulty of Paying Living Expenses: Not hard at all  Food Insecurity: No Food Insecurity (01/03/2024)   Hunger Vital Sign    Worried About Running Out of Food in the Last Year: Never true    Ran Out of Food in the Last Year: Never  true  Transportation Needs: No Transportation Needs (01/03/2024)   PRAPARE - Administrator, Civil Service (Medical): No    Lack of  Transportation (Non-Medical): No  Physical Activity: Sufficiently Active (01/03/2024)   Exercise Vital Sign    Days of Exercise per Week: 7 days    Minutes of Exercise per Session: 40 min  Stress: No Stress Concern Present (01/03/2024)   [employer] of Occupational Health - Occupational Stress Questionnaire    Feeling of Stress : Only a little  Social Connections: Moderately Integrated (01/03/2024)   Social Connection and Isolation Panel [NHANES]    Frequency of Communication with Friends and Family: More than three times a week    Frequency of Social Gatherings with Friends and Family: More than three times a week    Attends Religious Services: More than 4 times per year    Active Member of [employer] or Organizations: Yes    Attends Banker Meetings: More than 4 times per year    Marital Status: Separated  Intimate Partner Violence: Not At Risk (06/25/2023)   Humiliation, Afraid, Rape, and Kick questionnaire    Fear of Current or Ex-Partner: No    Emotionally Abused: No    Physically Abused: No    Sexually Abused: No    Review of Systems 13 point review of systems per patient health survey noted.  Negative other than as indicated above or in HPI.    Objective:   Vitals:   01/06/24 1514  BP: 124/76  Pulse: (!) 58  Temp: 98.1 F (36.7 C)  TempSrc: Temporal  SpO2: 98%  Weight: 177 lb 12.8 oz (80.6 kg)  Height: 5' 10.25" (1.784 m)     Physical Exam Vitals reviewed.  Constitutional:      Appearance: He is well-developed.  HENT:     Head: Normocephalic and atraumatic.     Right Ear: External ear normal.     Left Ear: External ear normal.  Eyes:     Conjunctiva/sclera: Conjunctivae normal.     Pupils: Pupils are equal, round, and reactive to light.  Neck:     Thyroid: No thyromegaly.  Cardiovascular:     Rate and Rhythm: Normal rate and regular rhythm.     Heart sounds: Normal heart sounds.  Pulmonary:     Effort: Pulmonary effort is normal. No  respiratory distress.     Breath sounds: Normal breath sounds. No wheezing.  Abdominal:     General: There is no distension.     Palpations: Abdomen is soft.     Tenderness: There is no abdominal tenderness.  Musculoskeletal:        General: No tenderness. Normal range of motion.     Cervical back: Normal range of motion and neck supple.  Lymphadenopathy:     Cervical: No cervical adenopathy.  Skin:    General: Skin is warm and dry.       Neurological:     Mental Status: He is alert and oriented to person, place, and time.     Deep Tendon Reflexes: Reflexes are normal and symmetric.  Psychiatric:        Behavior: Behavior normal.        Assessment & Plan:  PILOT PRINDLE is a 68 y.o. male . Annual physical exam  - -anticipatory guidance as below in AVS, screening labs above. Health maintenance items as above in HPI discussed/recommended as applicable.   Dyslipidemia - Plan: Comprehensive metabolic panel, Lipid panel  -With  history of CAD, asymptomatic, controlled LDL on previous labs.  Check labs and adjust plan accordingly, continue high-dose statin for now and follow-up with cardiology within the next 6 months.  Essential hypertension - Plan: CBC with Differential/Platelet  - Stable, check labs, adjust plan accordingly.  Screening for thyroid disorder - Plan: TSH  Screening for prostate cancer - Plan: PSA, Medicare  Screening for diabetes mellitus - Plan: Hemoglobin A1c  Screen for colon cancer - Plan: Cologuard  Lipoma  -Areas of concern appear to be benign lipomas, but continue monitoring with dermatology follow-up recommended if any changes.  No orders of the defined types were placed in this encounter.  Patient Instructions  Thanks for coming today.  The areas on the skin appear to be lipomas or benign soft tissue masses.  As long as those have not changed in size I think it is reasonable to have those examined by your dermatologist at next visit.  If any new  areas or there are changes, would recommend having dermatology see you sooner. I will check labs today and let you know if there are any concerns.  I do recommend following up with cardiology within the next 6 months. Cologuard colon cancer screening test was ordered as well as prostate cancer testing. Let me know if there are questions and take care!  Preventive Care 29 Years and Older, Male Preventive care refers to lifestyle choices and visits with your health care provider that can promote health and wellness. Preventive care visits are also called wellness exams. What can I expect for my preventive care visit? Counseling During your preventive care visit, your health care provider may ask about your: Medical history, including: Past medical problems. Family medical history. History of falls. Current health, including: Emotional well-being. Home life and relationship well-being. Sexual activity. Memory and ability to understand (cognition). Lifestyle, including: Alcohol, nicotine or tobacco, and drug use. Access to firearms. Diet, exercise, and sleep habits. Work and work Astronomer. Sunscreen use. Safety issues such as seatbelt and bike helmet use. Physical exam Your health care provider will check your: Height and weight. These may be used to calculate your BMI (body mass index). BMI is a measurement that tells if you are at a healthy weight. Waist circumference. This measures the distance around your waistline. This measurement also tells if you are at a healthy weight and may help predict your risk of certain diseases, such as type 2 diabetes and high blood pressure. Heart rate and blood pressure. Body temperature. Skin for abnormal spots. What immunizations do I need?  Vaccines are usually given at various ages, according to a schedule. Your health care provider will recommend vaccines for you based on your age, medical history, and lifestyle or other factors, such as  travel or where you work. What tests do I need? Screening Your health care provider may recommend screening tests for certain conditions. This may include: Lipid and cholesterol levels. Diabetes screening. This is done by checking your blood sugar (glucose) after you have not eaten for a while (fasting). Hepatitis C test. Hepatitis B test. HIV (human immunodeficiency virus) test. STI (sexually transmitted infection) testing, if you are at risk. Lung cancer screening. Colorectal cancer screening. Prostate cancer screening. Abdominal aortic aneurysm (AAA) screening. You may need this if you are a current or former smoker. Talk with your health care provider about your test results, treatment options, and if necessary, the need for more tests. Follow these instructions at home: Eating and drinking  Eat a  diet that includes fresh fruits and vegetables, whole grains, lean protein, and low-fat dairy products. Limit your intake of foods with high amounts of sugar, saturated fats, and salt. Take vitamin and mineral supplements as recommended by your health care provider. Do not drink alcohol if your health care provider tells you not to drink. If you drink alcohol: Limit how much you have to 0-2 drinks a day. Know how much alcohol is in your drink. In the [place] one drink equals one 12 oz bottle of beer (355 mL), one 5 oz glass of wine (148 mL), or one 1 oz glass of hard liquor (44 mL). Lifestyle Brush your teeth every morning and night with fluoride toothpaste. Floss one time each day. Exercise for at least 30 minutes 5 or more days each week. Do not use any products that contain nicotine or tobacco. These products include cigarettes, chewing tobacco, and vaping devices, such as e-cigarettes. If you need help quitting, ask your health care provider. Do not use drugs. If you are sexually active, practice safe sex. Use a condom or other form of protection to prevent STIs. Take aspirin only as  told by your health care provider. Make sure that you understand how much to take and what form to take. Work with your health care provider to find out whether it is safe and beneficial for you to take aspirin daily. Ask your health care provider if you need to take a cholesterol-lowering medicine (statin). Find healthy ways to manage stress, such as: Meditation, yoga, or listening to music. Journaling. Talking to a trusted person. Spending time with friends and family. Safety Always wear your seat belt while driving or riding in a vehicle. Do not drive: If you have been drinking alcohol. Do not ride with someone who has been drinking. When you are tired or distracted. While texting. If you have been using any mind-altering substances or drugs. Wear a helmet and other protective equipment during sports activities. If you have firearms in your house, make sure you follow all gun safety procedures. Minimize exposure to UV radiation to reduce your risk of skin cancer. What's next? Visit your health care provider once a year for an annual wellness visit. Ask your health care provider how often you should have your eyes and teeth checked. Stay up to date on all vaccines. This information is not intended to replace advice given to you by your health care provider. Make sure you discuss any questions you have with your health care provider. Document Revised: 05/07/2021 Document Reviewed: 05/07/2021 Elsevier Patient Education  2024 Elsevier Inc.    Signed,   Meredith Staggers, MD  Primary Care, [facility] 01/08/24 2:27 PM

## 2024-01-06 NOTE — Patient Instructions (Signed)
Thanks for coming today.  The areas on the skin appear to be lipomas or benign soft tissue masses.  As long as those have not changed in size I think it is reasonable to have those examined by your dermatologist at next visit.  If any new areas or there are changes, would recommend having dermatology see you sooner. I will check labs today and let you know if there are any concerns.  I do recommend following up with cardiology within the next 6 months. Cologuard colon cancer screening test was ordered as well as prostate cancer testing. Let me know if there are questions and take care!  Preventive Care 63 Years and Older, Male Preventive care refers to lifestyle choices and visits with your health care provider that can promote health and wellness. Preventive care visits are also called wellness exams. What can I expect for my preventive care visit? Counseling During your preventive care visit, your health care provider may ask about your: Medical history, including: Past medical problems. Family medical history. History of falls. Current health, including: Emotional well-being. Home life and relationship well-being. Sexual activity. Memory and ability to understand (cognition). Lifestyle, including: Alcohol, nicotine or tobacco, and drug use. Access to firearms. Diet, exercise, and sleep habits. Work and work Astronomer. Sunscreen use. Safety issues such as seatbelt and bike helmet use. Physical exam Your health care provider will check your: Height and weight. These may be used to calculate your BMI (body mass index). BMI is a measurement that tells if you are at a healthy weight. Waist circumference. This measures the distance around your waistline. This measurement also tells if you are at a healthy weight and may help predict your risk of certain diseases, such as type 2 diabetes and high blood pressure. Heart rate and blood pressure. Body temperature. Skin for abnormal  spots. What immunizations do I need?  Vaccines are usually given at various ages, according to a schedule. Your health care provider will recommend vaccines for you based on your age, medical history, and lifestyle or other factors, such as travel or where you work. What tests do I need? Screening Your health care provider may recommend screening tests for certain conditions. This may include: Lipid and cholesterol levels. Diabetes screening. This is done by checking your blood sugar (glucose) after you have not eaten for a while (fasting). Hepatitis C test. Hepatitis B test. HIV (human immunodeficiency virus) test. STI (sexually transmitted infection) testing, if you are at risk. Lung cancer screening. Colorectal cancer screening. Prostate cancer screening. Abdominal aortic aneurysm (AAA) screening. You may need this if you are a current or former smoker. Talk with your health care provider about your test results, treatment options, and if necessary, the need for more tests. Follow these instructions at home: Eating and drinking  Eat a diet that includes fresh fruits and vegetables, whole grains, lean protein, and low-fat dairy products. Limit your intake of foods with high amounts of sugar, saturated fats, and salt. Take vitamin and mineral supplements as recommended by your health care provider. Do not drink alcohol if your health care provider tells you not to drink. If you drink alcohol: Limit how much you have to 0-2 drinks a day. Know how much alcohol is in your drink. In the [place] one drink equals one 12 oz bottle of beer (355 mL), one 5 oz glass of wine (148 mL), or one 1 oz glass of hard liquor (44 mL). Lifestyle Brush your teeth every morning and night with  fluoride toothpaste. Floss one time each day. Exercise for at least 30 minutes 5 or more days each week. Do not use any products that contain nicotine or tobacco. These products include cigarettes, chewing tobacco, and  vaping devices, such as e-cigarettes. If you need help quitting, ask your health care provider. Do not use drugs. If you are sexually active, practice safe sex. Use a condom or other form of protection to prevent STIs. Take aspirin only as told by your health care provider. Make sure that you understand how much to take and what form to take. Work with your health care provider to find out whether it is safe and beneficial for you to take aspirin daily. Ask your health care provider if you need to take a cholesterol-lowering medicine (statin). Find healthy ways to manage stress, such as: Meditation, yoga, or listening to music. Journaling. Talking to a trusted person. Spending time with friends and family. Safety Always wear your seat belt while driving or riding in a vehicle. Do not drive: If you have been drinking alcohol. Do not ride with someone who has been drinking. When you are tired or distracted. While texting. If you have been using any mind-altering substances or drugs. Wear a helmet and other protective equipment during sports activities. If you have firearms in your house, make sure you follow all gun safety procedures. Minimize exposure to UV radiation to reduce your risk of skin cancer. What's next? Visit your health care provider once a year for an annual wellness visit. Ask your health care provider how often you should have your eyes and teeth checked. Stay up to date on all vaccines. This information is not intended to replace advice given to you by your health care provider. Make sure you discuss any questions you have with your health care provider. Document Revised: 05/07/2021 Document Reviewed: 05/07/2021 Elsevier Patient Education  2024 [employer].

## 2024-01-07 LAB — LIPID PANEL
Cholesterol: 82 mg/dL (ref 0–200)
HDL: 19.9 mg/dL — ABNORMAL LOW (ref >39.00)
LDL Cholesterol: 42 mg/dL (ref 0–99)
NonHDL: 62.38
Total CHOL/HDL Ratio: 4
Triglycerides: 103 mg/dL (ref 0.0–149.0)
VLDL: 20.6 mg/dL (ref 0.0–40.0)

## 2024-01-07 LAB — COMPREHENSIVE METABOLIC PANEL
ALT: 24 U/L (ref 0–53)
AST: 24 U/L (ref 0–37)
Albumin: 4.2 g/dL (ref 3.5–5.2)
Alkaline Phosphatase: 49 U/L (ref 39–117)
BUN: 15 mg/dL (ref 6–23)
CO2: 30 meq/L (ref 19–32)
Calcium: 9.1 mg/dL (ref 8.4–10.5)
Chloride: 99 meq/L (ref 96–112)
Creatinine, Ser: 1.18 mg/dL (ref 0.40–1.50)
GFR: 62.92 mL/min (ref >60.00)
Glucose, Bld: 83 mg/dL (ref 70–99)
Potassium: 4.3 meq/L (ref 3.5–5.1)
Sodium: 138 meq/L (ref 135–145)
Total Bilirubin: 0.3 mg/dL (ref 0.2–1.2)
Total Protein: 6.5 g/dL (ref 6.0–8.3)

## 2024-01-07 LAB — CBC WITH DIFFERENTIAL/PLATELET
Basophils Absolute: 0 10*3/uL (ref 0.0–0.1)
Basophils Relative: 0.9 % (ref 0.0–3.0)
Eosinophils Absolute: 0.1 10*3/uL (ref 0.0–0.7)
Eosinophils Relative: 1.6 % (ref 0.0–5.0)
HCT: 43.9 % (ref 39.0–52.0)
Hemoglobin: 14.7 g/dL (ref 13.0–17.0)
Lymphocytes Relative: 27.9 % (ref 12.0–46.0)
Lymphs Abs: 1 10*3/uL (ref 0.7–4.0)
MCHC: 33.6 g/dL (ref 30.0–36.0)
MCV: 100.1 fL — ABNORMAL HIGH (ref 78.0–100.0)
Monocytes Absolute: 0.6 10*3/uL (ref 0.1–1.0)
Monocytes Relative: 15.9 % — ABNORMAL HIGH (ref 3.0–12.0)
Neutro Abs: 2 10*3/uL (ref 1.4–7.7)
Neutrophils Relative %: 53.7 % (ref 43.0–77.0)
Platelets: 151 10*3/uL (ref 150.0–400.0)
RBC: 4.39 Mil/uL (ref 4.22–5.81)
RDW: 14.2 % (ref 11.5–15.5)
WBC: 3.7 10*3/uL — ABNORMAL LOW (ref 4.0–10.5)

## 2024-01-07 LAB — HEMOGLOBIN A1C: Hgb A1c MFr Bld: 5.3 % (ref 4.6–6.5)

## 2024-01-07 LAB — TSH: TSH: 1.35 u[IU]/mL (ref 0.35–5.50)

## 2024-01-07 LAB — PSA, MEDICARE: PSA: 1.94 ng/mL (ref 0.10–4.00)

## 2024-01-11 ENCOUNTER — Encounter: Payer: Self-pay | Admitting: Family Medicine

## 2024-01-16 DIAGNOSIS — Z1211 Encounter for screening for malignant neoplasm of colon: Secondary | ICD-10-CM | POA: Diagnosis not present

## 2024-01-21 ENCOUNTER — Other Ambulatory Visit: Payer: Self-pay | Admitting: Family Medicine

## 2024-01-21 DIAGNOSIS — R195 Other fecal abnormalities: Secondary | ICD-10-CM

## 2024-01-21 LAB — COLOGUARD: COLOGUARD: POSITIVE — AB

## 2024-01-21 NOTE — Progress Notes (Signed)
 Positive Cologuard, referred to gastroenterology.

## 2024-01-25 ENCOUNTER — Telehealth: Payer: Self-pay | Admitting: *Deleted

## 2024-01-25 NOTE — Telephone Encounter (Signed)
(  Late entry for 11/30/23)- 90 day call completed. Patient seen while out in community setting and discussed his 90 day status.No further CM needs.

## 2024-01-28 ENCOUNTER — Encounter: Payer: Self-pay | Admitting: Pediatrics

## 2024-02-07 ENCOUNTER — Encounter: Payer: Self-pay | Admitting: Orthopaedic Surgery

## 2024-02-07 ENCOUNTER — Other Ambulatory Visit (INDEPENDENT_AMBULATORY_CARE_PROVIDER_SITE_OTHER): Payer: Self-pay

## 2024-02-07 ENCOUNTER — Ambulatory Visit: Payer: PPO | Admitting: Orthopaedic Surgery

## 2024-02-07 DIAGNOSIS — Z96642 Presence of left artificial hip joint: Secondary | ICD-10-CM

## 2024-02-07 NOTE — Progress Notes (Signed)
 The patient is now 7 months status post a left total hip replacement to treat severe left hip arthritis.  He has has been riding his bike significantly and reports that his left hip is doing great with no issues.  He reports good range of motion and strength.  He does have a history of a resurfacing arthroplasty of the right hip that still is asymptomatic and has no issues at all.  His leg lengths appear equal.  He is walking with a normal gait.  Both hips move smoothly and fluidly.  An AP pelvis and lateral of the left total hip replacement shows a well-seated left total hip arthroplasty with no complicating features.  There are no complications that can be seen with the right hip resurfacing arthroplasty.  He will continue to perform activities as he tolerates with no restrictions.  Will see him back for an final visit in 6 months from now unless he is having issues.  At that visit we will have just a standing AP pelvis.

## 2024-02-18 ENCOUNTER — Ambulatory Visit: Admitting: *Deleted

## 2024-02-18 VITALS — Ht 71.0 in | Wt 175.0 lb

## 2024-02-18 DIAGNOSIS — Z1211 Encounter for screening for malignant neoplasm of colon: Secondary | ICD-10-CM

## 2024-02-18 MED ORDER — SUFLAVE 178.7 G PO SOLR
1.0000 | Freq: Once | ORAL | 0 refills | Status: AC
Start: 2024-02-18 — End: 2024-02-18

## 2024-02-18 NOTE — Progress Notes (Signed)
 Pre visit conducted over telephone. Instructions forwarded though Mychart. Patient aware [facility] will be notifying him   No egg or soy allergy known to patient  No issues known to pt with past sedation with any surgeries or procedures Patient denies ever being told they had issues or difficulty with intubation  No FH of Malignant Hyperthermia Pt is not on diet pills Pt is not on  home 02  Pt is not on blood thinners  Pt denies issues with constipation  No A fib or A flutter Have any cardiac testing pending--no Pt instructed to use Singlecare.com or GoodRx for a price reduction on prep

## 2024-03-02 ENCOUNTER — Ambulatory Visit (INDEPENDENT_AMBULATORY_CARE_PROVIDER_SITE_OTHER): Admitting: Family Medicine

## 2024-03-02 ENCOUNTER — Encounter: Payer: Self-pay | Admitting: Family Medicine

## 2024-03-02 VITALS — BP 124/78 | HR 64 | Temp 98.1°F | Wt 178.0 lb

## 2024-03-02 DIAGNOSIS — N6342 Unspecified lump in left breast, subareolar: Secondary | ICD-10-CM | POA: Diagnosis not present

## 2024-03-02 NOTE — Patient Instructions (Addendum)
 Thanks for coming in today.  I will order an ultrasound and mammogram to evaluate the area of concern initially, and then depending on what we see we can discuss possible causes including whether or not we need to check testosterone levels or other hormone testing.  Let me know if there are questions in the meantime, and if there are any changes.  I do not expect that to occur.

## 2024-03-02 NOTE — Progress Notes (Signed)
 Subjective:  Patient ID: Bradley [name], male    DOB: 14-Oct-1956  Age: 68 y.o. MRN: [id number]  CC:  Chief Complaint  Patient presents with   Mass    Patient states a small bump on pectoral muscle and has been there for a couple weeks. Bump is under the skin and only when hitting the bump there is pain and no change in color or size.     HPI Bradley [name] presents for   Chest lesion: Left pectoral area. Noticed 2-3 weeks ago. No pain with muscle use, but sore to press on area. No redness. No d/c. No rash. No known injury.  No nipple d/c or bleeding.  On low dose testosterone past few months through [facility]. No levels/testing.   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   Coronary artery disease involving native coronary artery of native heart without angina pectoris 11/01/2019   Essential hypertension 11/01/2019   Educated about COVID-19 virus infection 11/01/2019   CAD S/P LAD DES 08/14/2015   Dyslipidemia 08/14/2015   Acute ST elevation myocardial infarction (STEMI) involving left anterior descending coronary artery (HCC) 08/02/2015   Cardiomyopathy, ischemic:  08/02/2015   Family history of early CAD 05/30/2015   Right inguinal hernia 10/09/2013   Arthropathy of pelvic region and thigh 04/29/2009   Pain in limb 08/30/2007   UNEQUAL LEG LENGTH, ACQUIRED 08/30/2007   Past Medical History:  Diagnosis Date   Arthritis    CAD in native artery    Hypertension    LV dysfunction    EF post MI 35-40%   STEMI (ST elevation myocardial infarction) (HCC)    ant wall,  Xience alpine to LAD     Past Surgical History:  Procedure Laterality Date   CARDIAC CATHETERIZATION N/A 08/01/2015   Procedure: Left Heart Cath and Coronary Angiography;  Surgeon: Marykay Lex, MD;  Location: [facility] INVASIVE CV LAB;  Service: Cardiovascular;  Laterality: N/A;   CARDIAC CATHETERIZATION N/A 08/01/2015   Procedure: Coronary  Stent Intervention;  Surgeon: Marykay Lex, MD;  Location: [facility] INVASIVE CV LAB;  Service: Cardiovascular;  Laterality: N/A;   HERNIA REPAIR     HIP RESURFACING Right    INGUINAL HERNIA REPAIR Right 10/23/2013   Procedure: HERNIA REPAIR INGUINAL ADULT;  Surgeon: Shelly Rubenstein, MD;  Location: [facility] OR;  Service: General;  Laterality: Right;   INGUINAL HERNIA REPAIR Left 12/24/2016   Procedure: LEFT INGUINAL HERNIA REPAIR;  Surgeon: Abigail Miyamoto, MD;  Location: [facility] OR;  Service: General;  Laterality: Left;   INSERTION OF MESH Right 10/23/2013   Procedure: INSERTION OF MESH;  Surgeon: Shelly Rubenstein, MD;  Location: [facility] OR;  Service: General;  Laterality: Right;   INSERTION OF MESH Left 12/24/2016   Procedure: INSERTION OF MESH;  Surgeon: Abigail Miyamoto, MD;  Location: [facility] OR;  Service: General;  Laterality: Left;   REPAIR ANKLE LIGAMENT     TONSILLECTOMY     TOTAL HIP ARTHROPLASTY Left 06/25/2023   Procedure: LEFT TOTAL HIP ARTHROPLASTY ANTERIOR APPROACH;  Surgeon: Kathryne Hitch, MD;  Location: [facility] ORS;  Service: Orthopedics;  Laterality: Left;   No Known Allergies Prior to Admission medications   Medication Sig Start Date End Date Taking? Authorizing Provider  aspirin 81 MG chewable tablet Chew 1 tablet (81 mg total) by mouth 2 (two) times daily. 06/26/23   Kathryne Hitch, MD  atorvastatin (LIPITOR) 80 MG  tablet Take 1 tablet (80 mg total) by mouth daily. 06/22/23   Sharlene Dory, PA-C  Coenzyme Q10 (CO Q-10) 100 MG CAPS Take 100 mg by mouth 2 (two) times daily.    [provider]  COMIRNATY syringe  10/15/23   [provider]  FLUAD 0.5 ML injection  10/15/23   [provider]  lisinopril (ZESTRIL) 2.5 MG tablet Take 1 tablet (2.5 mg total) by mouth daily. 06/22/23   Sharlene Dory, PA-C  metoprolol succinate (TOPROL-XL) 25 MG 24 hr tablet TAKE 1/2 TABLET(12.5 MG) BY MOUTH DAILY 06/22/23   Sharlene Dory, PA-C  nitroGLYCERIN (NITROSTAT) 0.4 MG  SL tablet DISSOLVE 1 TABLET UNDER THE TONGUE UNDER THE TONGUE EVERY 5 MINUTES AS NEEDED FOR CHEST PAIN 06/22/23   Sharlene Dory, PA-C   Social History   Socioeconomic History   Marital status: Married    Spouse name: Not on file   Number of children: 4   Years of education: Not on file   Highest education level: Bachelor's degree (e.g., BA, AB, BS)  Occupational History   Occupation: Radiation protection practitioner  Tobacco Use   Smoking status: Never   Smokeless tobacco: Never  Vaping Use   Vaping status: Never Used  Substance and Sexual Activity   Alcohol use: Yes    Alcohol/week: 4.0 standard drinks of alcohol    Types: 4 Glasses of wine per week   Drug use: No   Sexual activity: Not Currently  Other Topics Concern   Not on file  Social History Narrative   Lives at home with wife.    Social Drivers of Corporate investment banker Strain: Low Risk  (01/03/2024)   Overall Financial Resource Strain (CARDIA)    Difficulty of Paying Living Expenses: Not hard at all  Food Insecurity: No Food Insecurity (01/03/2024)   Hunger Vital Sign    Worried About Running Out of Food in the Last Year: Never true    Ran Out of Food in the Last Year: Never true  Transportation Needs: No Transportation Needs (01/03/2024)   PRAPARE - Administrator, Civil Service (Medical): No    Lack of Transportation (Non-Medical): No  Physical Activity: Sufficiently Active (01/03/2024)   Exercise Vital Sign    Days of Exercise per Week: 7 days    Minutes of Exercise per Session: 40 min  Stress: No Stress Concern Present (01/03/2024)   [employer] of Occupational Health - Occupational Stress Questionnaire    Feeling of Stress : Only a little  Social Connections: Moderately Integrated (01/03/2024)   Social Connection and Isolation Panel [NHANES]    Frequency of Communication with Friends and Family: More than three times a week    Frequency of Social Gatherings with Friends and Family: More than three  times a week    Attends Religious Services: More than 4 times per year    Active Member of [employer] or Organizations: Yes    Attends Banker Meetings: More than 4 times per year    Marital Status: Separated  Intimate Partner Violence: Not At Risk (06/25/2023)   Humiliation, Afraid, Rape, and Kick questionnaire    Fear of Current or Ex-Partner: No    Emotionally Abused: No    Physically Abused: No    Sexually Abused: No    Review of Systems   Objective:   Vitals:   03/02/24 1354  BP: 124/78  Pulse: 64  Temp: 98.1 F (36.7 C)  TempSrc:  Temporal  SpO2: 99%  Weight: 178 lb (80.7 kg)     Physical Exam Vitals reviewed.  Constitutional:      General: He is not in acute distress.    Appearance: Normal appearance. He is well-developed.  HENT:     Head: Normocephalic and atraumatic.  Cardiovascular:     Rate and Rhythm: Normal rate.  Pulmonary:     Effort: Pulmonary effort is normal.  Chest:    Neurological:     Mental Status: He is alert and oriented to person, place, and time.  Psychiatric:        Mood and Affect: Mood normal.     Assessment & Plan:  Bradley [name] is a 68 y.o. male . Subareolar mass of left breast - Plan: MM 3D DIAGNOSTIC MAMMOGRAM UNILATERAL LEFT BREAST, US BREAST COMPLETE UNI LEFT INC AXILLA Question gynecomastia, versus cyst versus mass/nodule.  Only changes low-dose testosterone past few months.  Could be contributory but will check imaging with ultrasound and mammogram first, then decide next steps.  RTC precautions.  No orders of the defined types were placed in this encounter.  Patient Instructions  Thanks recommend today.  I will order an ultrasound and mammogram to evaluate the area of concern initially, and then depending on what we see we can discuss possible causes including whether or not we need to check testosterone levels or other hormone testing.  Let me know if there are questions in the meantime, and if there are any  changes.  I do not expect that to occur.    Signed,   Meredith Staggers, MD  Primary Care, [facility] 03/02/24 2:31 PM

## 2024-03-03 ENCOUNTER — Telehealth: Payer: Self-pay | Admitting: Family Medicine

## 2024-03-06 ENCOUNTER — Telehealth: Payer: Self-pay | Admitting: Family Medicine

## 2024-03-06 NOTE — Telephone Encounter (Signed)
 Error

## 2024-03-06 NOTE — Telephone Encounter (Signed)
 STAT request for signature for breast imaging placed in Dr Gayl Katos sign folder rather than Dr Maya Sparrow due to time sensitive matter

## 2024-03-06 NOTE — Telephone Encounter (Signed)
 Appreciate the assistance with this order.

## 2024-03-06 NOTE — Telephone Encounter (Signed)
 Faxed back.

## 2024-03-06 NOTE — Telephone Encounter (Signed)
 [facility] sent over a STAT Order that needs to be looked over, signed faxed back to its designed area.  Please advise, Thanks.  [facility] Fax #-- [phone]

## 2024-03-06 NOTE — Telephone Encounter (Signed)
 Signed and ready.

## 2024-03-15 ENCOUNTER — Encounter: Payer: Self-pay | Admitting: Pediatrics

## 2024-03-15 ENCOUNTER — Ambulatory Visit

## 2024-03-15 ENCOUNTER — Ambulatory Visit
Admission: RE | Admit: 2024-03-15 | Discharge: 2024-03-15 | Disposition: A | Discharge status: Home / Self Care | Source: Ambulatory Visit | Attending: Family Medicine | Admitting: Family Medicine

## 2024-03-15 DIAGNOSIS — N6342 Unspecified lump in left breast, subareolar: Secondary | ICD-10-CM

## 2024-03-15 DIAGNOSIS — N62 Hypertrophy of breast: Secondary | ICD-10-CM | POA: Diagnosis not present

## 2024-03-17 ENCOUNTER — Encounter: Payer: Self-pay | Admitting: Family Medicine

## 2024-03-19 NOTE — Progress Notes (Unsigned)
 [facility] History and Physical   Primary Care Physician:  Benjiman Bras, MD   Reason for Procedure:  Positive Cologuard  Plan:    Colonoscopy     HPI: Bradley [name] is a 68 y.o. male undergoing colonoscopy for investigation of positive Cologuard.  Mr. Tisby has never had a colonoscopy.  No family history of colorectal cancer or polyps.  Patient denies current symptoms of rectal bleeding or change in bowel habits.   Past Medical History:  Diagnosis Date   Arthritis    CAD in native artery    Hypertension    LV dysfunction    EF post MI 35-40%   STEMI (ST elevation myocardial infarction) (HCC)    ant wall,  Xience alpine to LAD      Past Surgical History:  Procedure Laterality Date   CARDIAC CATHETERIZATION N/A 08/01/2015   Procedure: Left Heart Cath and Coronary Angiography;  Surgeon: Arleen Lacer, MD;  Location: [facility] INVASIVE CV LAB;  Service: Cardiovascular;  Laterality: N/A;   CARDIAC CATHETERIZATION N/A 08/01/2015   Procedure: Coronary Stent Intervention;  Surgeon: Arleen Lacer, MD;  Location: [facility] INVASIVE CV LAB;  Service: Cardiovascular;  Laterality: N/A;   HERNIA REPAIR     HIP RESURFACING Right    INGUINAL HERNIA REPAIR Right 10/23/2013   Procedure: HERNIA REPAIR INGUINAL ADULT;  Surgeon: Rogena Class, MD;  Location: [facility] OR;  Service: General;  Laterality: Right;   INGUINAL HERNIA REPAIR Left 12/24/2016   Procedure: LEFT INGUINAL HERNIA REPAIR;  Surgeon: Oza Blumenthal, MD;  Location: [facility] OR;  Service: General;  Laterality: Left;   INSERTION OF MESH Right 10/23/2013   Procedure: INSERTION OF MESH;  Surgeon: Rogena Class, MD;  Location: [facility] OR;  Service: General;  Laterality: Right;   INSERTION OF MESH Left 12/24/2016   Procedure: INSERTION OF MESH;  Surgeon: Oza Blumenthal, MD;  Location: [facility] OR;  Service: General;  Laterality: Left;   REPAIR ANKLE LIGAMENT     TONSILLECTOMY     TOTAL HIP ARTHROPLASTY Left 06/25/2023   Procedure:  LEFT TOTAL HIP ARTHROPLASTY ANTERIOR APPROACH;  Surgeon: Arnie Lao, MD;  Location: [facility] ORS;  Service: Orthopedics;  Laterality: Left;    Prior to Admission medications   Medication Sig Start Date End Date Taking? Authorizing Provider  aspirin  81 MG chewable tablet Chew 1 tablet (81 mg total) by mouth 2 (two) times daily. 06/26/23   Arnie Lao, MD  atorvastatin  (LIPITOR ) 80 MG tablet Take 1 tablet (80 mg total) by mouth daily. 06/22/23   Von Grumbling, PA-C  Coenzyme Q10 (CO Q-10) 100 MG CAPS Take 100 mg by mouth 2 (two) times daily.    [provider]  COMIRNATY syringe  10/15/23   [provider]  FLUAD 0.5 ML injection  10/15/23   [provider]  lisinopril  (ZESTRIL ) 2.5 MG tablet Take 1 tablet (2.5 mg total) by mouth daily. 06/22/23   Conte, Tessa N, PA-C  metoprolol  succinate (TOPROL -XL) 25 MG 24 hr tablet TAKE 1/2 TABLET(12.5 MG) BY MOUTH DAILY 06/22/23   Conte, Tessa N, PA-C  nitroGLYCERIN  (NITROSTAT ) 0.4 MG SL tablet DISSOLVE 1 TABLET UNDER THE TONGUE UNDER THE TONGUE EVERY 5 MINUTES AS NEEDED FOR CHEST PAIN 06/22/23   Von Grumbling, PA-C    Current Outpatient Medications  Medication Sig Dispense Refill   aspirin  81 MG chewable tablet Chew 1 tablet (81 mg total) by mouth 2 (two) times daily. 30 tablet 0  atorvastatin  (LIPITOR ) 80 MG tablet Take 1 tablet (80 mg total) by mouth daily. 90 tablet 3   Coenzyme Q10 (CO Q-10) 100 MG CAPS Take 100 mg by mouth 2 (two) times daily.     lisinopril  (ZESTRIL ) 2.5 MG tablet Take 1 tablet (2.5 mg total) by mouth daily. 90 tablet 3   metoprolol  succinate (TOPROL -XL) 25 MG 24 hr tablet TAKE 1/2 TABLET(12.5 MG) BY MOUTH DAILY 45 tablet 3   nitroGLYCERIN  (NITROSTAT ) 0.4 MG SL tablet DISSOLVE 1 TABLET UNDER THE TONGUE UNDER THE TONGUE EVERY 5 MINUTES AS NEEDED FOR CHEST PAIN (Patient not taking: Reported on 03/20/2024) 25 tablet 3   Current Facility-Administered Medications  Medication Dose Route  Frequency Provider Last Rate Last Admin   0.9 %  sodium chloride  infusion  500 mL Intravenous Once Teryl Mcconaghy, Scarlette Currier, MD        Allergies as of 03/20/2024   (No Known Allergies)    Family History  Problem Relation Age of Onset   Breast cancer Mother        19s   Stroke Mother 58   CAD Mother 22   CAD Father 39       CABG   CAD Sister 6   Cancer Brother        testicular cancer   Hyperlipidemia Brother    Hyperlipidemia Brother    Colon polyps Other    Colon cancer Other     Social History   Socioeconomic History   Marital status: Married    Spouse name: Not on file   Number of children: 4   Years of education: Not on file   Highest education level: Bachelor's degree (e.g., BA, AB, BS)  Occupational History   Occupation: Radiation protection practitioner  Tobacco Use   Smoking status: Never   Smokeless tobacco: Never  Vaping Use   Vaping status: Never Used  Substance and Sexual Activity   Alcohol use: Yes    Alcohol/week: 4.0 standard drinks of alcohol    Types: 4 Glasses of wine per week   Drug use: No   Sexual activity: Not Currently  Other Topics Concern   Not on file  Social History Narrative   Lives at home with wife.    Social Drivers of Corporate investment banker Strain: Low Risk  (01/03/2024)   Overall Financial Resource Strain (CARDIA)    Difficulty of Paying Living Expenses: Not hard at all  Food Insecurity: No Food Insecurity (01/03/2024)   Hunger Vital Sign    Worried About Running Out of Food in the Last Year: Never true    Ran Out of Food in the Last Year: Never true  Transportation Needs: No Transportation Needs (01/03/2024)   PRAPARE - Administrator, Civil Service (Medical): No    Lack of Transportation (Non-Medical): No  Physical Activity: Sufficiently Active (01/03/2024)   Exercise Vital Sign    Days of Exercise per Week: 7 days    Minutes of Exercise per Session: 40 min  Stress: No Stress Concern Present (01/03/2024)   [employer]  of Occupational Health - Occupational Stress Questionnaire    Feeling of Stress : Only a little  Social Connections: Moderately Integrated (01/03/2024)   Social Connection and Isolation Panel [NHANES]    Frequency of Communication with Friends and Family: More than three times a week    Frequency of Social Gatherings with Friends and Family: More than three times a week    Attends Religious Services: More than  4 times per year    Active Member of Clubs or Organizations: Yes    Attends Banker Meetings: More than 4 times per year    Marital Status: Separated  Intimate Partner Violence: Not At Risk (06/25/2023)   Humiliation, Afraid, Rape, and Kick questionnaire    Fear of Current or Ex-Partner: No    Emotionally Abused: No    Physically Abused: No    Sexually Abused: No    Review of Systems:  All other review of systems negative except as mentioned in the HPI.  Physical Exam: Vital signs BP (!) 120/55   Pulse (!) 49   Temp 98.3 F (36.8 C) (Temporal)   Ht 5' 10.25" (1.784 m)   Wt 175 lb (79.4 kg)   SpO2 98%   BMI 24.93 kg/m   General:   Alert,  Well-developed, well-nourished, pleasant and cooperative in NAD Airway:  Mallampati 2 Lungs:  Clear throughout to auscultation.   Heart:  Regular rate and rhythm; no murmurs, clicks, rubs,  or gallops. Abdomen:  Soft, nontender and nondistended. Normal bowel sounds.   Neuro/Psych:  Normal mood and affect. A and O x 3  Eugenia Hess, MD [facility]

## 2024-03-20 ENCOUNTER — Ambulatory Visit: Admitting: Pediatrics

## 2024-03-20 ENCOUNTER — Encounter: Payer: Self-pay | Admitting: Pediatrics

## 2024-03-20 VITALS — BP 98/56 | HR 48 | Temp 98.3°F | Resp 14 | Ht 70.25 in | Wt 175.0 lb

## 2024-03-20 DIAGNOSIS — Z1211 Encounter for screening for malignant neoplasm of colon: Secondary | ICD-10-CM | POA: Diagnosis not present

## 2024-03-20 DIAGNOSIS — R195 Other fecal abnormalities: Secondary | ICD-10-CM | POA: Diagnosis not present

## 2024-03-20 DIAGNOSIS — K5730 Diverticulosis of large intestine without perforation or abscess without bleeding: Secondary | ICD-10-CM | POA: Diagnosis not present

## 2024-03-20 DIAGNOSIS — D123 Benign neoplasm of transverse colon: Secondary | ICD-10-CM

## 2024-03-20 DIAGNOSIS — I2510 Atherosclerotic heart disease of native coronary artery without angina pectoris: Secondary | ICD-10-CM | POA: Diagnosis not present

## 2024-03-20 DIAGNOSIS — I10 Essential (primary) hypertension: Secondary | ICD-10-CM | POA: Diagnosis not present

## 2024-03-20 MED ORDER — SODIUM CHLORIDE 0.9 % IV SOLN: 500.0000 mL | Freq: Once | INTRAVENOUS | Status: DC

## 2024-03-20 NOTE — Progress Notes (Signed)
 Called to room to assist during endoscopic procedure.  Patient ID and intended procedure confirmed with present staff. Received instructions for my participation in the procedure from the performing physician.

## 2024-03-20 NOTE — Patient Instructions (Signed)
-  Handout on polyps and diverticulosis provided -await pathology results -repeat colonoscopy for surveillance recommended. Date to be determined when pathology result become available   -Continue present medications   YOU HAD AN ENDOSCOPIC PROCEDURE TODAY AT THE [facility]:   Refer to the procedure report that was given to you for any specific questions about what was found during the examination.  If the procedure report does not answer your questions, please call your gastroenterologist to clarify.  If you requested that your care partner not be given the details of your procedure findings, then the procedure report has been included in a sealed envelope for you to review at your convenience later.  YOU SHOULD EXPECT: Some feelings of bloating in the abdomen. Passage of more gas than usual.  Walking can help get rid of the air that was put into your GI tract during the procedure and reduce the bloating. If you had a lower endoscopy (such as a colonoscopy or flexible sigmoidoscopy) you may notice spotting of blood in your stool or on the toilet paper. If you underwent a bowel prep for your procedure, you may not have a normal bowel movement for a few days.  Please Note:  You might notice some irritation and congestion in your nose or some drainage.  This is from the oxygen used during your procedure.  There is no need for concern and it should clear up in a day or so.  SYMPTOMS TO REPORT IMMEDIATELY:  Following lower endoscopy (colonoscopy or flexible sigmoidoscopy):  Excessive amounts of blood in the stool  Significant tenderness or worsening of abdominal pains  Swelling of the abdomen that is new, acute  Fever of 100F or higher   For urgent or emergent issues, a gastroenterologist can be reached at any hour by calling [phone]. Do not use MyChart messaging for urgent concerns.    DIET:  We do recommend a small meal at first, but then you may proceed to your regular  diet.  Drink plenty of fluids but you should avoid alcoholic beverages for 24 hours.  ACTIVITY:  You should plan to take it easy for the rest of today and you should NOT DRIVE or use heavy machinery until tomorrow (because of the sedation medicines used during the test).    FOLLOW UP: Our staff will call the number listed on your records the next business day following your procedure.  We will call around 7:15- 8:00 am to check on you and address any questions or concerns that you may have regarding the information given to you following your procedure. If we do not reach you, we will leave a message.     If any biopsies were taken you will be contacted by phone or by letter within the next 1-3 weeks.  Please call us at [phone] if you have not heard about the biopsies in 3 weeks.    SIGNATURES/CONFIDENTIALITY: You and/or your care partner have signed paperwork which will be entered into your electronic medical record.  These signatures attest to the fact that that the information above on your After Visit Summary has been reviewed and is understood.  Full responsibility of the confidentiality of this discharge information lies with you and/or your care-partner.   

## 2024-03-20 NOTE — Progress Notes (Signed)
 To pacu, VSS. Report to [email or web address]

## 2024-03-20 NOTE — Progress Notes (Signed)
 Pt's states no medical or surgical changes since previsit or office visit.

## 2024-03-20 NOTE — Op Note (Signed)
 [facility] Patient Name: Bradley [name] Procedure Date: 03/20/2024 7:11 AM MRN: [id number] Endoscopist: Eugenia Hess , MD, [id number] Age: 68 Referring MD:  Date of Birth: 12/16/1955 Gender: Male Account #: [id number] Procedure:                Colonoscopy Indications:              This is the patient's first colonoscopy, Positive                            Cologuard test Medicines:                Monitored Anesthesia Care Procedure:                Pre-Anesthesia Assessment:                           - Prior to the procedure, a History and Physical                            was performed, and patient medications and                            allergies were reviewed. The patient's tolerance of                            previous anesthesia was also reviewed. The risks                            and benefits of the procedure and the sedation                            options and risks were discussed with the patient.                            All questions were answered, and informed consent                            was obtained. Prior Anticoagulants: The patient has                            taken no anticoagulant or antiplatelet agents                            except for aspirin . ASA Grade Assessment: II - A                            patient with mild systemic disease. After reviewing                            the risks and benefits, the patient was deemed in                            satisfactory condition to undergo the procedure.  After obtaining informed consent, the colonoscope                            was passed under direct vision. Throughout the                            procedure, the patient's blood pressure, pulse, and                            oxygen saturations were monitored continuously. The                            Olympus CF-HQ190L ([id number]) Colonoscope was                            introduced through the anus and  advanced to the                            cecum, identified by appendiceal orifice and                            ileocecal valve. The colonoscopy was performed                            without difficulty. The patient tolerated the                            procedure well. The quality of the bowel                            preparation was good. The ileocecal valve,                            appendiceal orifice, and rectum were photographed. Scope In: 8:04:26 AM Scope Out: 8:22:29 AM Scope Withdrawal Time: 0 hours 13 minutes 54 seconds  Total Procedure Duration: 0 hours 18 minutes 3 seconds  Findings:                 The perianal and digital rectal examinations were                            normal. Pertinent negatives include normal                            sphincter tone and no palpable rectal lesions.                           Multiple small-mouthed diverticula were found in                            the sigmoid colon and descending colon.                           A 5 mm polyp was found in the transverse colon. The  polyp was sessile. The polyp was removed with a                            cold snare. Resection and retrieval were complete.                           The retroflexed view of the distal rectum and anal                            verge was normal and showed no anal or rectal                            abnormalities. Complications:            No immediate complications. Estimated blood loss:                            Minimal. Estimated Blood Loss:     Estimated blood loss was minimal. Impression:               - Diverticulosis in the sigmoid colon and in the                            descending colon.                           - One 5 mm polyp in the transverse colon, removed                            with a cold snare. Resected and retrieved. Recommendation:           - Discharge patient to home (ambulatory).                            - Await pathology results.                           - Repeat colonoscopy for surveillance based on                            pathology results.                           - The findings and recommendations were discussed                            with the patient's family.                           - Return to referring physician.                           - Patient has a contact number available for                            emergencies. The signs and symptoms of potential  delayed complications were discussed with the                            patient. Return to normal activities tomorrow.                            Written discharge instructions were provided to the                            patient. Eugenia Hess, MD 03/20/2024 8:28:33 AM This report has been signed electronically.

## 2024-03-21 ENCOUNTER — Telehealth: Payer: Self-pay | Admitting: *Deleted

## 2024-03-21 NOTE — Telephone Encounter (Signed)
 No answer on  follow up call. Left message.

## 2024-03-23 ENCOUNTER — Encounter: Payer: Self-pay | Admitting: Family Medicine

## 2024-03-23 NOTE — Telephone Encounter (Signed)
 Patient is wondering if appointment is needed with you to discuss possible next steps?

## 2024-03-24 LAB — SURGICAL PATHOLOGY

## 2024-03-26 ENCOUNTER — Encounter: Payer: Self-pay | Admitting: Pediatrics

## 2024-04-26 ENCOUNTER — Encounter: Payer: Self-pay | Admitting: Cardiology

## 2024-06-19 ENCOUNTER — Other Ambulatory Visit: Payer: Self-pay | Admitting: Physician Assistant

## 2024-06-26 ENCOUNTER — Other Ambulatory Visit: Payer: Self-pay

## 2024-06-26 MED ORDER — METOPROLOL SUCCINATE ER 25 MG PO TB24: ORAL_TABLET | ORAL | 0 refills | Status: DC

## 2024-06-26 NOTE — Progress Notes (Unsigned)
  Cardiology Office Note:   Date:  06/26/2024  ID:  Bradley [name], DOB July 02, 1956, MRN [id number] PCP: Levora Reyes SAUNDERS, MD  [facility] Providers Cardiologist:  None {  History of Present Illness:   Bradley [name] is a 68 y.o. male with a past medical history of anterior MI in 2016 (coronary calcium  score which put him around the 40th percentile with some mild calcium  in the LAD), had a POET which was negative but ultimately presented with subsequent anterior MI.  Had stenting of proximal LAD.  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  Ejection fraction slightly reduced 45%.  Follow-up echo that showed EF 50 to 55%.  I saw him most recently prior to hip replacement.    Since I last saw him he has done well.  He bikes inside and ot and does a lot of work in [employer] it.  The patient denies any new symptoms such as chest discomfort, neck or arm discomfort. There has been no new shortness of breath, PND or orthopnea. There have been no reported palpitations, presyncope or syncope.   He had a hip surgery and did well with this.  ROS: As stated in the HPI and negative for all other systems.  Studies Reviewed:    EKG:       ***  Risk Assessment/Calculations:             Physical Exam:   VS:  There were no vitals taken for this visit.   Wt Readings from Last 3 Encounters:  03/20/24 175 lb (79.4 kg)  03/02/24 178 lb (80.7 kg)  02/18/24 175 lb (79.4 kg)     GEN: Well nourished, well developed in no acute distress NECK: No JVD; No carotid bruits CARDIAC: RRR, no murmurs, rubs, gallops RESPIRATORY:  Clear to auscultation without rales, wheezing or rhonchi  ABDOMEN: Soft, non-tender, non-distended EXTREMITIES:  No edema; No deformity   ASSESSMENT AND PLAN:    CAD:  The patient has no new sypmtoms.  No further cardiovascular testing is indicated.  We will continue with aggressive risk reduction and meds as listed.   HTN:  BP is at target.  No change in  therapy.   HLD:  was 42.  Continue meds as listed.  I will check an LP(a).   HFrEF:  His EF was low normal at 50 to 55% 2022.  He has some mild aortic valve sclerosis but no stenosis .       Follow up ***  Signed, Bradley Schilling, MD

## 2024-06-29 ENCOUNTER — Ambulatory Visit: Attending: Cardiology | Admitting: Cardiology

## 2024-06-29 ENCOUNTER — Encounter: Payer: Self-pay | Admitting: Cardiology

## 2024-06-29 ENCOUNTER — Other Ambulatory Visit (HOSPITAL_COMMUNITY): Payer: Self-pay

## 2024-06-29 VITALS — BP 104/68 | HR 51 | Ht 70.0 in | Wt 176.0 lb

## 2024-06-29 DIAGNOSIS — E785 Hyperlipidemia, unspecified: Secondary | ICD-10-CM | POA: Diagnosis not present

## 2024-06-29 DIAGNOSIS — I10 Essential (primary) hypertension: Secondary | ICD-10-CM | POA: Diagnosis not present

## 2024-06-29 DIAGNOSIS — I255 Ischemic cardiomyopathy: Secondary | ICD-10-CM

## 2024-06-29 DIAGNOSIS — I2510 Atherosclerotic heart disease of native coronary artery without angina pectoris: Secondary | ICD-10-CM | POA: Diagnosis not present

## 2024-06-29 MED ORDER — BLOOD PRESSURE MONITOR MISC
1.0000 | Freq: Every day | 0 refills | Status: AC
  Filled 2024-06-29: qty 1, 30d supply, fill #0

## 2024-06-29 NOTE — Patient Instructions (Signed)
 Medication Instructions:  Your physician recommends that you continue on your current medications as directed. Please refer to the Current Medication list given to you today.  *If you need a refill on your cardiac medications before your next appointment, please call your pharmacy*  Lab Work: Lp(a) today If you have labs (blood work) drawn today and your tests are completely normal, you will receive your results only by: MyChart Message (if you have MyChart) OR A paper copy in the mail If you have any lab test that is abnormal or we need to change your treatment, we will call you to review the results.  Testing/Procedures: NONE  Follow-Up: At [facility], you and your health needs are our priority.  As part of our continuing mission to provide you with exceptional heart care, our providers are all part of one team.  This team includes your primary Cardiologist (physician) and Advanced Practice Providers or APPs (Physician Assistants and Nurse Practitioners) who all work together to provide you with the care you need, when you need it.  Your next appointment:   1 year(s)  Provider:   Lynwood Schilling, MD  We recommend signing up for the patient portal called MyChart.  Sign up information is provided on this After Visit Summary.  MyChart is used to connect with patients for Virtual Visits (Telemedicine).  Patients are able to view lab/test results, encounter notes, upcoming appointments, etc.  Non-urgent messages can be sent to your provider as well.   To learn more about what you can do with MyChart, go to [email or web address].

## 2024-06-29 NOTE — Addendum Note (Signed)
 Addended by: Keiana Tavella N on: 06/29/2024 08:36 AM   Modules accepted: Orders

## 2024-06-30 LAB — LIPOPROTEIN A (LPA): Lipoprotein (a): 10.8 nmol/L (ref <75.0)

## 2024-07-02 ENCOUNTER — Ambulatory Visit: Payer: Self-pay | Admitting: Cardiology

## 2024-07-05 ENCOUNTER — Ambulatory Visit (INDEPENDENT_AMBULATORY_CARE_PROVIDER_SITE_OTHER): Payer: PPO | Admitting: Family Medicine

## 2024-07-05 ENCOUNTER — Encounter: Payer: Self-pay | Admitting: Family Medicine

## 2024-07-05 VITALS — BP 118/72 | HR 52 | Wt 182.0 lb

## 2024-07-05 DIAGNOSIS — G4700 Insomnia, unspecified: Secondary | ICD-10-CM | POA: Diagnosis not present

## 2024-07-05 DIAGNOSIS — I10 Essential (primary) hypertension: Secondary | ICD-10-CM

## 2024-07-05 DIAGNOSIS — E785 Hyperlipidemia, unspecified: Secondary | ICD-10-CM | POA: Diagnosis not present

## 2024-07-05 NOTE — Patient Instructions (Addendum)
 Thank you for coming in today. No change in medications at this time. If there are any concerns on your bloodwork, I will let you know. Take care!  See info on insomnia below.  Continue to avoid caffeine in the afternoon/evening.  Try to avoid worhouts early am - try reading or book or magazine with dim light to hopefully reprogram wakeup time.  List items of concern on a card to review/problem solve later in day instead of at 4am.  Melatonin is fine for now. Tylenol  pm is an option, but stop if side effects or ineffective.  Let know this works in next few weeks.    Insomnia Insomnia is a sleep disorder that makes it difficult to fall asleep or stay asleep. Insomnia can cause fatigue, low energy, difficulty concentrating, mood swings, and poor performance at work or school. There are three different ways to classify insomnia: Difficulty falling asleep. Difficulty staying asleep. Waking up too early in the morning. Any type of insomnia can be long-term (chronic) or short-term (acute). Both are common. Short-term insomnia usually lasts for 3 months or less. Chronic insomnia occurs at least three times a week for longer than 3 months. What are the causes? Insomnia may be caused by another condition, situation, or substance, such as: Having certain mental health conditions, such as anxiety and depression. Using caffeine, alcohol, tobacco, or drugs. Having gastrointestinal conditions, such as gastroesophageal reflux disease (GERD). Having certain medical conditions. These include: Asthma. Alzheimer's disease. Stroke. Chronic pain. An overactive thyroid  gland (hyperthyroidism). Other sleep disorders, such as restless legs syndrome and sleep apnea. Menopause. Sometimes, the cause of insomnia may not be known. What increases the risk? Risk factors for insomnia include: Gender. Females are affected more often than males. Age. Insomnia is more common as people get older. Stress and certain  medical and mental health conditions. Lack of exercise. Having an irregular work schedule. This may include working night shifts and traveling between different time zones. What are the signs or symptoms? If you have insomnia, the main symptom is having trouble falling asleep or having trouble staying asleep. This may lead to other symptoms, such as: Feeling tired or having low energy. Feeling nervous about going to sleep. Not feeling rested in the morning. Having trouble concentrating. Feeling irritable, anxious, or depressed. How is this diagnosed? This condition may be diagnosed based on: Your symptoms and medical history. Your health care provider may ask about: Your sleep habits. Any medical conditions you have. Your mental health. A physical exam. How is this treated? Treatment for insomnia depends on the cause. Treatment may focus on treating an underlying condition that is causing the insomnia. Treatment may also include: Medicines to help you sleep. Counseling or therapy. Lifestyle adjustments to help you sleep better. Follow these instructions at home: Eating and drinking  Limit or avoid alcohol, caffeinated beverages, and products that contain nicotine and tobacco, especially close to bedtime. These can disrupt your sleep. Do not eat a large meal or eat spicy foods right before bedtime. This can lead to digestive discomfort that can make it hard for you to sleep. Sleep habits  Keep a sleep diary to help you and your health care provider figure out what could be causing your insomnia. Write down: When you sleep. When you wake up during the night. How well you sleep and how rested you feel the next day. Any side effects of medicines you are taking. What you eat and drink. Make your bedroom a dark, comfortable place  where it is easy to fall asleep. Put up shades or blackout curtains to block light from outside. Use a white noise machine to block noise. Keep the  temperature cool. Limit screen use before bedtime. This includes: Not watching TV. Not using your smartphone, tablet, or computer. Stick to a routine that includes going to bed and waking up at the same times every day and night. This can help you fall asleep faster. Consider making a quiet activity, such as reading, part of your nighttime routine. Try to avoid taking naps during the day so that you sleep better at night. Get out of bed if you are still awake after 15 minutes of trying to sleep. Keep the lights down, but try reading or doing a quiet activity. When you feel sleepy, go back to bed. General instructions Take over-the-counter and prescription medicines only as told by your health care provider. Exercise regularly as told by your health care provider. However, avoid exercising in the hours right before bedtime. Use relaxation techniques to manage stress. Ask your health care provider to suggest some techniques that may work well for you. These may include: Breathing exercises. Routines to release muscle tension. Visualizing peaceful scenes. Make sure that you drive carefully. Do not drive if you feel very sleepy. Keep all follow-up visits. This is important. Contact a health care provider if: You are tired throughout the day. You have trouble in your daily routine due to sleepiness. You continue to have sleep problems, or your sleep problems get worse. Get help right away if: You have thoughts about hurting yourself or someone else. Get help right away if you feel like you may hurt yourself or others, or have thoughts about taking your own life. Go to your nearest emergency room or: Call 911. Call the National Suicide Prevention Lifeline at 731-518-2655 or 988. This is open 24 hours a day. Text the Crisis Text Line at 5074135361. Summary Insomnia is a sleep disorder that makes it difficult to fall asleep or stay asleep. Insomnia can be long-term (chronic) or short-term  (acute). Treatment for insomnia depends on the cause. Treatment may focus on treating an underlying condition that is causing the insomnia. Keep a sleep diary to help you and your health care provider figure out what could be causing your insomnia. This information is not intended to replace advice given to you by your health care provider. Make sure you discuss any questions you have with your health care provider. Document Revised: 10/20/2021 Document Reviewed: 10/20/2021 Elsevier Patient Education  2024 [employer].

## 2024-07-05 NOTE — Progress Notes (Signed)
 Subjective:  Patient ID: Bradley [name], male    DOB: 27-Apr-1956  Age: 68 y.o. MRN: [id number]  CC:  Chief Complaint  Patient presents with   Medical Management of Chronic Issues    6 month follow up from 01/06/2024    HPI Bradley [name] presents for   Hypertension: With HFrEF followed by cardiology, Dr. Lavona.  CAD with anterior MI in 2016 with prior stenting of the proximal LAD, nonobstructive disease in RCA.  Prior reduced EF of 45%, 50 to 55%, treated with aspirin , Lipitor , lisinopril , metoprolol  with nitroglycerin  as needed.  Echo in May 2022 with low normal EF, mild AI and AAS but no change in therapy. Cardiology note reviewed from August 7.  Asymptomatic.  Aggressive risk reduction with meds as above.  Blood pressure was at target, lipoprotein a looked okay.  No change in therapy.  Continues on lisinopril  2.5 mg daily, Toprol -XL 25 mg daily, aspirin  81 mg daily.  Denies any chest pain dyspnea  on exertion for side effects with current medications. Home readings:none - has home BP monitor.  BP Readings from Last 3 Encounters:  07/05/24 118/72  06/29/24 104/68  03/20/24 (!) 98/56   Lab Results  Component Value Date   CREATININE 1.18 01/06/2024   Hyperlipidemia: Lipitor  80 mg daily with coq.10 supplementation.  LDL looked good in February at 28.  Normal lipoprotein a recently as above. Not fasting - ate 3 hours ago.   Lab Results  Component Value Date   CHOL 82 01/06/2024   HDL 19.90 (L) 01/06/2024   LDLCALC 42 01/06/2024   TRIG 103.0 01/06/2024   CHOLHDL 4 01/06/2024   Lab Results  Component Value Date   ALT 24 01/06/2024   AST 24 01/06/2024   ALKPHOS 49 01/06/2024   BILITOT 0.3 01/06/2024    Gynecomastia Discussed in April, mammogram indicated moderate benign gynecomastia.  Incidentally visualized mild benign right gynecomastia as well but no suspicious masses calcifications or other findings identified in the bilateral breasts.  He had been taking  testosterone supplementation low-dose for a few months at that time through [facility].  Option to meet with endocrinology for further discussion or workup if needed.   Prior enlargement resolved within a few weeks of last visit. Still on low dose testosterone through [facility]. No new lesions.    Insomnia: Some early awakening. 4-430am every other night recently. Off and on for awhile. More frequent last few months. No new stressors. Bedtime 08-1029.  Pushups will allow him to return to sleep, spin on bike, watch news, then sometimes back to bed or work. More consistent early awakening early.  No PND, no orthopnea.  No regular snoring known. Tx: melatonin. Magnesium at times. Alcohol - usually earlier in night, not before bedtime.     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   Coronary artery disease involving native coronary artery of native heart without angina pectoris 11/01/2019   Essential hypertension 11/01/2019   Educated about COVID-19 virus infection 11/01/2019   CAD S/P LAD DES 08/14/2015   Dyslipidemia 08/14/2015   Acute ST elevation myocardial infarction (STEMI) involving left anterior descending coronary artery (HCC) 08/02/2015   Cardiomyopathy, ischemic:  08/02/2015   Family history of early CAD 05/30/2015   Right inguinal hernia 10/09/2013   Arthropathy of pelvic region and thigh 04/29/2009   Pain in limb 08/30/2007   UNEQUAL LEG LENGTH, ACQUIRED 08/30/2007   Past  Medical History:  Diagnosis Date   Arthritis    CAD in native artery    Hypertension    LV dysfunction    EF post MI 35-40%   STEMI (ST elevation myocardial infarction) (HCC)    ant wall,  Xience alpine to LAD     Past Surgical History:  Procedure Laterality Date   CARDIAC CATHETERIZATION N/A 08/01/2015   Procedure: Left Heart Cath and Coronary Angiography;  Surgeon: Alm LELON Clay, MD;  Location: [facility] INVASIVE CV LAB;  Service:  Cardiovascular;  Laterality: N/A;   CARDIAC CATHETERIZATION N/A 08/01/2015   Procedure: Coronary Stent Intervention;  Surgeon: Alm LELON Clay, MD;  Location: [facility] INVASIVE CV LAB;  Service: Cardiovascular;  Laterality: N/A;   HERNIA REPAIR     HIP RESURFACING Right    INGUINAL HERNIA REPAIR Right 10/23/2013   Procedure: HERNIA REPAIR INGUINAL ADULT;  Surgeon: Vicenta DELENA Poli, MD;  Location: [facility] OR;  Service: General;  Laterality: Right;   INGUINAL HERNIA REPAIR Left 12/24/2016   Procedure: LEFT INGUINAL HERNIA REPAIR;  Surgeon: Vicenta Poli, MD;  Location: [facility] OR;  Service: General;  Laterality: Left;   INSERTION OF MESH Right 10/23/2013   Procedure: INSERTION OF MESH;  Surgeon: Vicenta DELENA Poli, MD;  Location: [facility] OR;  Service: General;  Laterality: Right;   INSERTION OF MESH Left 12/24/2016   Procedure: INSERTION OF MESH;  Surgeon: Vicenta Poli, MD;  Location: [facility] OR;  Service: General;  Laterality: Left;   JOINT REPLACEMENT     REPAIR ANKLE LIGAMENT     TONSILLECTOMY     TOTAL HIP ARTHROPLASTY Left 06/25/2023   Procedure: LEFT TOTAL HIP ARTHROPLASTY ANTERIOR APPROACH;  Surgeon: Poli Lonni GRADE, MD;  Location: [facility] ORS;  Service: Orthopedics;  Laterality: Left;   No Known Allergies Prior to Admission medications   Medication Sig Start Date End Date Taking? Authorizing Provider  aspirin  81 MG chewable tablet Chew 1 tablet (81 mg total) by mouth 2 (two) times daily. 06/26/23  Yes Poli Lonni GRADE, MD  atorvastatin  (LIPITOR ) 80 MG tablet Take 1 tablet (80 mg total) by mouth daily. 06/22/23  Yes Lucien Orren SAILOR, PA-C  Blood Pressure Monitor MISC Use as directed 06/29/24  Yes Lavona Agent, MD  Coenzyme Q10 (CO Q-10) 100 MG CAPS Take 100 mg by mouth 2 (two) times daily.   Yes [provider]  lisinopril  (ZESTRIL ) 2.5 MG tablet Take 1 tablet (2.5 mg total) by mouth daily. 06/22/23  Yes Lucien, Tessa N, PA-C  metoprolol  succinate (TOPROL -XL) 25 MG 24 hr tablet TAKE 1/2  TABLET(12.5 MG) BY MOUTH DAILY 06/26/24  Yes Conte, Tessa N, PA-C  nitroGLYCERIN  (NITROSTAT ) 0.4 MG SL tablet DISSOLVE 1 TABLET UNDER THE TONGUE UNDER THE TONGUE EVERY 5 MINUTES AS NEEDED FOR CHEST PAIN 06/22/23  Yes Lucien Orren SAILOR, PA-C   Social History   Socioeconomic History   Marital status: Married    Spouse name: Not on file   Number of children: 4   Years of education: Not on file   Highest education level: Bachelor's degree (e.g., BA, AB, BS)  Occupational History   Occupation: Radiation protection practitioner  Tobacco Use   Smoking status: Never   Smokeless tobacco: Never  Vaping Use   Vaping status: Never Used  Substance and Sexual Activity   Alcohol use: Yes    Alcohol/week: 4.0 standard drinks of alcohol    Types: 4 Glasses of wine per week   Drug use: No   Sexual activity: Not Currently  Other Topics Concern   Not on file  Social History Narrative   Lives at home with wife.    Social Drivers of Corporate investment banker Strain: Low Risk  (07/04/2024)   Overall Financial Resource Strain (CARDIA)    Difficulty of Paying Living Expenses: Not hard at all  Food Insecurity: No Food Insecurity (07/04/2024)   Hunger Vital Sign    Worried About Running Out of Food in the Last Year: Never true    Ran Out of Food in the Last Year: Never true  Transportation Needs: No Transportation Needs (07/04/2024)   PRAPARE - Administrator, Civil Service (Medical): No    Lack of Transportation (Non-Medical): No  Physical Activity: Sufficiently Active (07/04/2024)   Exercise Vital Sign    Days of Exercise per Week: 7 days    Minutes of Exercise per Session: 60 min  Stress: No Stress Concern Present (07/04/2024)   [employer] of Occupational Health - Occupational Stress Questionnaire    Feeling of Stress: Not at all  Social Connections: Moderately Integrated (07/04/2024)   Social Connection and Isolation Panel    Frequency of Communication with Friends and Family: More than three  times a week    Frequency of Social Gatherings with Friends and Family: More than three times a week    Attends Religious Services: More than 4 times per year    Active Member of [employer] or Organizations: Yes    Attends Banker Meetings: More than 4 times per year    Marital Status: Separated  Intimate Partner Violence: Not At Risk (06/25/2023)   Humiliation, Afraid, Rape, and Kick questionnaire    Fear of Current or Ex-Partner: No    Emotionally Abused: No    Physically Abused: No    Sexually Abused: No    Review of Systems   Objective:   Vitals:   07/05/24 1550  BP: 118/72  Pulse: (!) 52  SpO2: 98%  Weight: 182 lb (82.6 kg)     Physical Exam Vitals reviewed.  Constitutional:      Appearance: He is well-developed.  HENT:     Head: Normocephalic and atraumatic.  Neck:     Vascular: No carotid bruit or JVD.  Cardiovascular:     Rate and Rhythm: Normal rate and regular rhythm.     Heart sounds: Normal heart sounds. No murmur heard. Pulmonary:     Effort: Pulmonary effort is normal.     Breath sounds: Normal breath sounds. No rales.  Musculoskeletal:     Right lower leg: No edema.     Left lower leg: No edema.  Skin:    General: Skin is warm and dry.  Neurological:     Mental Status: He is alert and oriented to person, place, and time.  Psychiatric:        Mood and Affect: Mood normal.        Assessment & Plan:  SILVERIO HAGAN is a 68 y.o. male . Dyslipidemia - Plan: Lipid panel  - Tolerating current med regimen, check labs and adjust plan accordingly  Essential hypertension - Plan: Comprehensive metabolic panel with GFR  - Stable with current regimen.  Check labs as above and regimen adjustment accordingly.  Insomnia, unspecified type  - New concern as above.  Sleep hygiene discussed.  Overall does well in the evening.  Primarily issue of early awakening.  Question whether or not his exercise to try to help get back to sleep may  be worsening  his sleep hygiene as he is waking up early to exercise, possibly setting him up a new baseline or regimen for wake-up time.  -Sleep hygiene reviewed again.  Handout given.  Okay to continue melatonin in the evening, Tylenol  PM as an option over-the-counter or ZzzQuil but potential side effects discussed.  Try to avoid the early morning workouts.  Make a list of concerns when he wakes to review, problem solve later in the day.  Update on symptoms next few weeks to decide on changes.  Could consider med such as Rozerem or other med options can be discussed at that time if ineffective.  No orders of the defined types were placed in this encounter.  Patient Instructions  Thank you for coming in today. No change in medications at this time. If there are any concerns on your bloodwork, I will let you know. Take care!  See info on insomnia below.  Continue to avoid caffeine in the afternoon/evening.  Try to avoid worhouts early am - try reading or book or magazine with dim light to hopefully reprogram wakeup time.  List items of concern on a card to review/problem solve later in day instead of at 4am.  Melatonin is fine for now. Tylenol  pm is an option, but stop if side effects or ineffective.  Let know this works in next few weeks.    Insomnia Insomnia is a sleep disorder that makes it difficult to fall asleep or stay asleep. Insomnia can cause fatigue, low energy, difficulty concentrating, mood swings, and poor performance at work or school. There are three different ways to classify insomnia: Difficulty falling asleep. Difficulty staying asleep. Waking up too early in the morning. Any type of insomnia can be long-term (chronic) or short-term (acute). Both are common. Short-term insomnia usually lasts for 3 months or less. Chronic insomnia occurs at least three times a week for longer than 3 months. What are the causes? Insomnia may be caused by another condition, situation, or substance, such  as: Having certain mental health conditions, such as anxiety and depression. Using caffeine, alcohol, tobacco, or drugs. Having gastrointestinal conditions, such as gastroesophageal reflux disease (GERD). Having certain medical conditions. These include: Asthma. Alzheimer's disease. Stroke. Chronic pain. An overactive thyroid  gland (hyperthyroidism). Other sleep disorders, such as restless legs syndrome and sleep apnea. Menopause. Sometimes, the cause of insomnia may not be known. What increases the risk? Risk factors for insomnia include: Gender. Females are affected more often than males. Age. Insomnia is more common as people get older. Stress and certain medical and mental health conditions. Lack of exercise. Having an irregular work schedule. This may include working night shifts and traveling between different time zones. What are the signs or symptoms? If you have insomnia, the main symptom is having trouble falling asleep or having trouble staying asleep. This may lead to other symptoms, such as: Feeling tired or having low energy. Feeling nervous about going to sleep. Not feeling rested in the morning. Having trouble concentrating. Feeling irritable, anxious, or depressed. How is this diagnosed? This condition may be diagnosed based on: Your symptoms and medical history. Your health care provider may ask about: Your sleep habits. Any medical conditions you have. Your mental health. A physical exam. How is this treated? Treatment for insomnia depends on the cause. Treatment may focus on treating an underlying condition that is causing the insomnia. Treatment may also include: Medicines to help you sleep. Counseling or therapy. Lifestyle adjustments to help you sleep better.  Follow these instructions at home: Eating and drinking  Limit or avoid alcohol, caffeinated beverages, and products that contain nicotine and tobacco, especially close to bedtime. These can  disrupt your sleep. Do not eat a large meal or eat spicy foods right before bedtime. This can lead to digestive discomfort that can make it hard for you to sleep. Sleep habits  Keep a sleep diary to help you and your health care provider figure out what could be causing your insomnia. Write down: When you sleep. When you wake up during the night. How well you sleep and how rested you feel the next day. Any side effects of medicines you are taking. What you eat and drink. Make your bedroom a dark, comfortable place where it is easy to fall asleep. Put up shades or blackout curtains to block light from outside. Use a white noise machine to block noise. Keep the temperature cool. Limit screen use before bedtime. This includes: Not watching TV. Not using your smartphone, tablet, or computer. Stick to a routine that includes going to bed and waking up at the same times every day and night. This can help you fall asleep faster. Consider making a quiet activity, such as reading, part of your nighttime routine. Try to avoid taking naps during the day so that you sleep better at night. Get out of bed if you are still awake after 15 minutes of trying to sleep. Keep the lights down, but try reading or doing a quiet activity. When you feel sleepy, go back to bed. General instructions Take over-the-counter and prescription medicines only as told by your health care provider. Exercise regularly as told by your health care provider. However, avoid exercising in the hours right before bedtime. Use relaxation techniques to manage stress. Ask your health care provider to suggest some techniques that may work well for you. These may include: Breathing exercises. Routines to release muscle tension. Visualizing peaceful scenes. Make sure that you drive carefully. Do not drive if you feel very sleepy. Keep all follow-up visits. This is important. Contact a health care provider if: You are tired throughout  the day. You have trouble in your daily routine due to sleepiness. You continue to have sleep problems, or your sleep problems get worse. Get help right away if: You have thoughts about hurting yourself or someone else. Get help right away if you feel like you may hurt yourself or others, or have thoughts about taking your own life. Go to your nearest emergency room or: Call 911. Call the National Suicide Prevention Lifeline at 972-629-7055 or 988. This is open 24 hours a day. Text the Crisis Text Line at 947 254 2472. Summary Insomnia is a sleep disorder that makes it difficult to fall asleep or stay asleep. Insomnia can be long-term (chronic) or short-term (acute). Treatment for insomnia depends on the cause. Treatment may focus on treating an underlying condition that is causing the insomnia. Keep a sleep diary to help you and your health care provider figure out what could be causing your insomnia. This information is not intended to replace advice given to you by your health care provider. Make sure you discuss any questions you have with your health care provider. Document Revised: 10/20/2021 Document Reviewed: 10/20/2021 Elsevier Patient Education  2024 Elsevier Inc.         Signed,   Reyes Pines, MD [facility], [facility] 07/05/24 4:09 PM

## 2024-07-06 ENCOUNTER — Encounter: Payer: Self-pay | Admitting: Family Medicine

## 2024-07-06 LAB — COMPREHENSIVE METABOLIC PANEL WITH GFR
ALT: 30 U/L (ref 0–53)
AST: 29 U/L (ref 0–37)
Albumin: 4.2 g/dL (ref 3.5–5.2)
Alkaline Phosphatase: 34 U/L — ABNORMAL LOW (ref 39–117)
BUN: 19 mg/dL (ref 6–23)
CO2: 29 meq/L (ref 19–32)
Calcium: 9 mg/dL (ref 8.4–10.5)
Chloride: 103 meq/L (ref 96–112)
Creatinine, Ser: 1.08 mg/dL (ref 0.40–1.50)
GFR: 69.73 mL/min (ref >60.00)
Glucose, Bld: 96 mg/dL (ref 70–99)
Potassium: 4.4 meq/L (ref 3.5–5.1)
Sodium: 138 meq/L (ref 135–145)
Total Bilirubin: 0.5 mg/dL (ref 0.2–1.2)
Total Protein: 6.5 g/dL (ref 6.0–8.3)

## 2024-07-06 LAB — LIPID PANEL
Cholesterol: 94 mg/dL (ref 0–200)
HDL: 37.5 mg/dL — ABNORMAL LOW (ref >39.00)
LDL Cholesterol: 40 mg/dL (ref 0–99)
NonHDL: 56.64
Total CHOL/HDL Ratio: 3
Triglycerides: 84 mg/dL (ref 0.0–149.0)
VLDL: 16.8 mg/dL (ref 0.0–40.0)

## 2024-07-07 ENCOUNTER — Ambulatory Visit: Payer: Self-pay | Admitting: Family Medicine

## 2024-07-28 ENCOUNTER — Other Ambulatory Visit: Payer: Self-pay | Admitting: Physician Assistant

## 2024-07-31 ENCOUNTER — Other Ambulatory Visit: Payer: Self-pay

## 2024-07-31 MED ORDER — ATORVASTATIN CALCIUM 80 MG PO TABS
80.0000 mg | ORAL_TABLET | Freq: Every day | ORAL | 3 refills | Status: AC
  Filled 2024-09-25 – 2024-10-24 (×2): qty 90, 90d supply, fill #0

## 2024-08-02 ENCOUNTER — Ambulatory Visit (INDEPENDENT_AMBULATORY_CARE_PROVIDER_SITE_OTHER)

## 2024-08-02 VITALS — Ht 70.0 in | Wt 175.0 lb

## 2024-08-02 DIAGNOSIS — Z0000 Encounter for general adult medical examination without abnormal findings: Secondary | ICD-10-CM | POA: Diagnosis not present

## 2024-08-02 NOTE — Patient Instructions (Signed)
 Mr. Bradley [name],  Thank you for taking the time for your Medicare Wellness Visit. I appreciate your continued commitment to your health goals. Please review the care plan we discussed, and feel free to reach out if I can assist you further.  Medicare recommends these wellness visits once per year to help you and your care team stay ahead of potential health issues. These visits are designed to focus on prevention, allowing your provider to concentrate on managing your acute and chronic conditions during your regular appointments.  Please note that Annual Wellness Visits do not include a physical exam. Some assessments may be limited, especially if the visit was conducted virtually. If needed, we may recommend a separate in-person follow-up with your provider.  Ongoing Care Seeing your primary care provider every 3 to 6 months helps us  monitor your health and provide consistent, personalized care. Last office visit on 07/05/2024.  You are due for a Flu vaccine.  Keep up the good work.  Referrals If a referral was made during today's visit and you haven't received any updates within two weeks, please contact the referred provider directly to check on the status.  Recommended Screenings:  Health Maintenance  Topic Date Due   Flu Shot  06/23/2024   COVID-19 Vaccine (3 - 2025-26 season) 07/24/2024   DTaP/Tdap/Td vaccine (2 - Td or Tdap) 04/14/2025   Medicare Annual Wellness Visit  08/02/2025   Cologuard (Stool DNA test)  01/15/2027   Pneumococcal Vaccine for age over 32  Completed   Hepatitis C Screening  Completed   Zoster (Shingles) Vaccine  Completed   HPV Vaccine  Aged Out   Meningitis B Vaccine  Aged Out   Colon Cancer Screening  Discontinued       08/02/2024    2:11 PM  Advanced Directives  Does Patient Have a Medical Advance Directive? Yes  Type of Estate agent of Commerce;Living will  Copy of Healthcare Power of Attorney in Chart? No - copy requested   Advance  Care Planning is important because it: Ensures you receive medical care that aligns with your values, goals, and preferences. Provides guidance to your family and loved ones, reducing the emotional burden of decision-making during critical moments.  Vision: Annual vision screenings are recommended for early detection of glaucoma, cataracts, and diabetic retinopathy. These exams can also reveal signs of chronic conditions such as diabetes and high blood pressure.  Dental: Annual dental screenings help detect early signs of oral cancer, gum disease, and other conditions linked to overall health, including heart disease and diabetes.  Please see the attached documents for additional preventive care recommendations.

## 2024-08-02 NOTE — Progress Notes (Signed)
 Subjective:   Bradley [name] is a 68 y.o. who presents for a Medicare Wellness preventive visit.  As a reminder, Annual Wellness Visits don't include a physical exam, and some assessments may be limited, especially if this visit is performed virtually. We may recommend an in-person follow-up visit with your provider if needed.  Visit Complete: Virtual I connected with  Bradley [name] on 08/02/24 by a audio enabled telemedicine application and verified that I am speaking with the correct person using two identifiers.  Patient Location: Home  Provider Location: Home Office  I discussed the limitations of evaluation and management by telemedicine. The patient expressed understanding and agreed to proceed.  Vital Signs: Because this visit was a virtual/telehealth visit, some criteria may be missing or patient reported. Any vitals not documented were not able to be obtained and vitals that have been documented are patient reported.  VideoDeclined- This patient declined Librarian, academic. Therefore the visit was completed with audio only.  Persons Participating in Visit: Patient.  AWV Questionnaire: Yes: Patient Medicare AWV questionnaire was completed by the patient on 08/01/2024; I have confirmed that all information answered by patient is correct and no changes since this date.  Cardiac Risk Factors include: advanced age (>67men, >33 women);hypertension;male gender;dyslipidemia     Objective:    Today's Vitals   08/02/24 1359  Weight: 175 lb (79.4 kg)  Height: 5' 10 (1.778 m)   Body mass index is 25.11 kg/m.     08/02/2024    2:11 PM 06/25/2023    5:00 PM 06/16/2023    1:20 PM 03/10/2023   11:56 AM 06/17/2022    3:21 PM 12/22/2016    8:30 AM 12/22/2016    8:22 AM  Advanced Directives  Does Patient Have a Medical Advance Directive? Yes Yes Yes Yes Yes Yes  Yes   Type of Estate agent of [place];Living will Healthcare Power of  [place];Living will Living will;Healthcare Power of [employer] Power of [employer] Power of [place];Living will Healthcare Power of [place];Living will Living will;Healthcare Power of Attorney  Does patient want to make changes to medical advance directive?  No - Patient declined Yes (ED - send information to MyChart)   No - Patient declined  No - Patient declined   Copy of Healthcare Power of Attorney in Chart? No - copy requested No - copy requested No - copy requested Yes - validated most recent copy scanned in chart (See row information) No - copy requested No - copy requested  No - copy requested      Data saved with a previous flowsheet row definition    Current Medications (verified) Outpatient Encounter Medications as of 08/02/2024  Medication Sig   aspirin  81 MG chewable tablet Chew 1 tablet (81 mg total) by mouth 2 (two) times daily.   atorvastatin  (LIPITOR ) 80 MG tablet Take 1 tablet (80 mg total) by mouth daily.   Blood Pressure Monitor MISC Use as directed   Coenzyme Q10 (CO Q-10) 100 MG CAPS Take 100 mg by mouth 2 (two) times daily.   lisinopril  (ZESTRIL ) 2.5 MG tablet Take 1 tablet (2.5 mg total) by mouth daily.   metoprolol  succinate (TOPROL -XL) 25 MG 24 hr tablet TAKE 1/2 TABLET(12.5 MG) BY MOUTH DAILY   nitroGLYCERIN  (NITROSTAT ) 0.4 MG SL tablet DISSOLVE 1 TABLET UNDER THE TONGUE UNDER THE TONGUE EVERY 5 MINUTES AS NEEDED FOR CHEST PAIN   No facility-administered encounter medications on file as of 08/02/2024.  Allergies (verified) Patient has no known allergies.   History: Past Medical History:  Diagnosis Date   Arthritis    CAD in native artery    Hypertension    LV dysfunction    EF post MI 35-40%   STEMI (ST elevation myocardial infarction) (HCC)    ant wall,  Xience alpine to LAD     Past Surgical History:  Procedure Laterality Date   CARDIAC CATHETERIZATION N/A 08/01/2015   Procedure: Left Heart Cath and Coronary Angiography;   Surgeon: Alm LELON Clay, MD;  Location: [facility] INVASIVE CV LAB;  Service: Cardiovascular;  Laterality: N/A;   CARDIAC CATHETERIZATION N/A 08/01/2015   Procedure: Coronary Stent Intervention;  Surgeon: Alm LELON Clay, MD;  Location: [facility] INVASIVE CV LAB;  Service: Cardiovascular;  Laterality: N/A;   HERNIA REPAIR     HIP RESURFACING Right    INGUINAL HERNIA REPAIR Right 10/23/2013   Procedure: HERNIA REPAIR INGUINAL ADULT;  Surgeon: Vicenta DELENA Poli, MD;  Location: [facility] OR;  Service: General;  Laterality: Right;   INGUINAL HERNIA REPAIR Left 12/24/2016   Procedure: LEFT INGUINAL HERNIA REPAIR;  Surgeon: Vicenta Poli, MD;  Location: [facility] OR;  Service: General;  Laterality: Left;   INSERTION OF MESH Right 10/23/2013   Procedure: INSERTION OF MESH;  Surgeon: Vicenta DELENA Poli, MD;  Location: [facility] OR;  Service: General;  Laterality: Right;   INSERTION OF MESH Left 12/24/2016   Procedure: INSERTION OF MESH;  Surgeon: Vicenta Poli, MD;  Location: [facility] OR;  Service: General;  Laterality: Left;   JOINT REPLACEMENT     REPAIR ANKLE LIGAMENT     TONSILLECTOMY     TOTAL HIP ARTHROPLASTY Left 06/25/2023   Procedure: LEFT TOTAL HIP ARTHROPLASTY ANTERIOR APPROACH;  Surgeon: Poli Lonni GRADE, MD;  Location: [facility] ORS;  Service: Orthopedics;  Laterality: Left;   Family History  Problem Relation Age of Onset   Breast cancer Mother        85s   Stroke Mother 69   CAD Mother 63   CAD Father 37       CABG   CAD Sister 77   Hyperlipidemia Sister    Cancer Brother        testicular cancer   Hyperlipidemia Brother    Hyperlipidemia Brother    Colon polyps Other    Colon cancer Other    Social History   Socioeconomic History   Marital status: Divorced    Spouse name: Not on file   Number of children: 4   Years of education: Not on file   Highest education level: Bachelor's degree (e.g., BA, AB, BS)  Occupational History   Occupation: Radiation protection practitioner  Tobacco Use   Smoking status: Never    Smokeless tobacco: Never  Vaping Use   Vaping status: Never Used  Substance and Sexual Activity   Alcohol use: Yes    Alcohol/week: 4.0 standard drinks of alcohol    Types: 4 Glasses of wine per week   Drug use: No   Sexual activity: Not Currently  Other Topics Concern   Not on file  Social History Narrative   Lives alone/2025   Social Drivers of Health   Financial Resource Strain: Low Risk  (08/02/2024)   Overall Financial Resource Strain (CARDIA)    Difficulty of Paying Living Expenses: Not hard at all  Food Insecurity: No Food Insecurity (08/02/2024)   Hunger Vital Sign    Worried About Running Out of Food in the Last Year: Never true  Ran Out of Food in the Last Year: Never true  Transportation Needs: No Transportation Needs (08/02/2024)   PRAPARE - Administrator, Civil Service (Medical): No    Lack of Transportation (Non-Medical): No  Physical Activity: Sufficiently Active (08/02/2024)   Exercise Vital Sign    Days of Exercise per Week: 7 days    Minutes of Exercise per Session: 60 min  Stress: No Stress Concern Present (08/02/2024)   [employer] of Occupational Health - Occupational Stress Questionnaire    Feeling of Stress: Not at all  Social Connections: Moderately Integrated (08/02/2024)   Social Connection and Isolation Panel    Frequency of Communication with Friends and Family: More than three times a week    Frequency of Social Gatherings with Friends and Family: More than three times a week    Attends Religious Services: More than 4 times per year    Active Member of [employer] or Organizations: Yes    Attends Engineer, structural: More than 4 times per year    Marital Status: Divorced    Tobacco Counseling Counseling given: Not Answered    Clinical Intake:  Pre-visit preparation completed: Yes  Pain : No/denies pain     BMI - recorded: 26.11 Nutritional Status: BMI 25 -29 Overweight Nutritional Risks: None Diabetes:  No  Lab Results  Component Value Date   HGBA1C 5.3 01/06/2024     How often do you need to have someone help you when you read instructions, pamphlets, or other written materials from your doctor or pharmacy?: 1 - Never  Interpreter Needed?: No  Information entered by :: Prisma Decarlo, RMA   Activities of Daily Living     08/01/2024   11:09 AM  In your present state of health, do you have any difficulty performing the following activities:  Hearing? 0  Vision? 0  Difficulty concentrating or making decisions? 0  Walking or climbing stairs? 0  Dressing or bathing? 0  Doing errands, shopping? 0  Preparing Food and eating ? N  Using the Toilet? N  In the past six months, have you accidently leaked urine? N  Do you have problems with loss of bowel control? N  Managing your Medications? N  Managing your Finances? N  Housekeeping or managing your Housekeeping? N    Patient Care Team: Levora Reyes SAUNDERS, MD as PCP - General (Family Medicine)  I have updated your Care Teams any recent Medical Services you may have received from other providers in the past year.     Assessment:   This is a routine wellness examination for [place].  Hearing/Vision screen Hearing Screening - Comments:: Denies hearing difficulties   Vision Screening - Comments:: Wears eyeglasses/   Goals Addressed               This Visit's Progress     Patient Stated (pt-stated)        Maintain health/2025       Depression Screen     08/02/2024    2:12 PM 07/05/2024    3:55 PM 03/02/2024    1:54 PM 01/06/2024    3:16 PM 04/07/2023   11:06 AM 03/10/2023   11:42 AM 11/09/2022    1:20 PM  PHQ 2/9 Scores  PHQ - 2 Score 0 0 0 0 0 0 0  PHQ- 9 Score 2 2 1  0 4 2 3     Fall Risk     08/01/2024   11:09 AM 07/05/2024  3:55 PM 01/06/2024    3:16 PM 04/07/2023   11:06 AM 03/10/2023   11:40 AM  Fall Risk   Falls in the past year? 0 0 0 0 0  Number falls in past yr:  0 0 0 0  Injury with Fall? 0 0 0 0 0   Risk for fall due to :  No Fall Risks No Fall Risks    Follow up Falls evaluation completed;Falls prevention discussed Falls evaluation completed Falls evaluation completed Falls evaluation completed Falls evaluation completed;Education provided;Falls prevention discussed    MEDICARE RISK AT HOME:  Medicare Risk at Home Any stairs in or around the home?: (Patient-Rptd) Yes If so, are there any without handrails?: (Patient-Rptd) Yes Adequate lighting in your home to reduce risk of falls?: (Patient-Rptd) Yes Life alert?: (Patient-Rptd) No Use of a cane, walker or w/c?: (Patient-Rptd) No Grab bars in the bathroom?: (Patient-Rptd) Yes Shower chair or bench in shower?: (Patient-Rptd) Yes Elevated toilet seat or a handicapped toilet?: (Patient-Rptd) No  TIMED UP AND GO:  Was the test performed?  No  Cognitive Function: Declined/Normal: No cognitive concerns noted by patient or family. Patient alert, oriented, able to answer questions appropriately and recall recent events. No signs of memory loss or confusion.        03/10/2023   11:40 AM  6CIT Screen  What Year? 0 points  What month? 0 points  What time? 0 points  Count back from 20 0 points  Months in reverse 0 points  Repeat phrase 0 points  Total Score 0 points    Immunizations Immunization History  Administered Date(s) Administered   Fluad Quad(high Dose 65+) 10/18/2019, 09/27/2020   INFLUENZA, HIGH DOSE SEASONAL PF 10/21/2022   Influenza-Unspecified 11/07/2021, 09/29/2022, 10/15/2023   PFIZER(Purple Top)SARS-COV-2 Vaccination 10/15/2023   PNEUMOCOCCAL CONJUGATE-20 11/09/2022   Pfizer Covid-19 Vaccine Bivalent Booster 33yrs & up 09/29/2022, 10/21/2022   Tdap 04/15/2015   Zoster Recombinant(Shingrix) 02/19/2022   Zoster, Unspecified 09/16/2022    Screening Tests Health Maintenance  Topic Date Due   Influenza Vaccine  06/23/2024   COVID-19 Vaccine (3 - 2025-26 season) 07/24/2024   DTaP/Tdap/Td (2 - Td or Tdap)  04/14/2025   Medicare Annual Wellness (AWV)  08/02/2025   Fecal DNA (Cologuard)  01/15/2027   Pneumococcal Vaccine: 50+ Years  Completed   Hepatitis C Screening  Completed   Zoster Vaccines- Shingrix  Completed   HPV VACCINES  Aged Out   Meningococcal B Vaccine  Aged Out   Colonoscopy  Discontinued    Health Maintenance Items Addressed: See Nurse Notes at the end of this note  Additional Screening:  Vision Screening: Recommended annual ophthalmology exams for early detection of glaucoma and other disorders of the eye. Is the patient up to date with their annual eye exam?  No  Who is the provider or what is the name of the office in which the patient attends annual eye exams? [facility]  Dental Screening: Recommended annual dental exams for proper oral hygiene  Community Resource Referral / Chronic Care Management: CRR required this visit?  No   CCM required this visit?  No   Plan:    I have personally reviewed and noted the following in the patient's chart:   Medical and social history Use of alcohol, tobacco or illicit drugs  Current medications and supplements including opioid prescriptions. Patient is not currently taking opioid prescriptions. Functional ability and status Nutritional status Physical activity Advanced directives List of other physicians Hospitalizations, surgeries, and ER  visits in previous 12 months Vitals Screenings to include cognitive, depression, and falls Referrals and appointments  In addition, I have reviewed and discussed with patient certain preventive protocols, quality metrics, and best practice recommendations. A written personalized care plan for preventive services as well as general preventive health recommendations were provided to patient.   Ashlea Dusing L Kaitlyn Franko, CMA   08/02/2024   After Visit Summary: (MyChart) Due to this being a telephonic visit, the after visit summary with patients personalized plan was offered to patient via  MyChart   Notes: Patient is due for a Flu vaccine.  He had no other concerns to address today.

## 2024-08-05 ENCOUNTER — Other Ambulatory Visit: Payer: Self-pay | Admitting: Physician Assistant

## 2024-08-09 ENCOUNTER — Other Ambulatory Visit (INDEPENDENT_AMBULATORY_CARE_PROVIDER_SITE_OTHER): Payer: Self-pay

## 2024-08-09 ENCOUNTER — Encounter: Payer: Self-pay | Admitting: Orthopaedic Surgery

## 2024-08-09 ENCOUNTER — Ambulatory Visit: Admitting: Orthopaedic Surgery

## 2024-08-09 ENCOUNTER — Other Ambulatory Visit: Payer: Self-pay | Admitting: Physician Assistant

## 2024-08-09 DIAGNOSIS — Z96642 Presence of left artificial hip joint: Secondary | ICD-10-CM | POA: Diagnosis not present

## 2024-08-09 MED ORDER — LISINOPRIL 2.5 MG PO TABS
2.5000 mg | ORAL_TABLET | Freq: Every day | ORAL | 3 refills | Status: AC
  Filled 2024-09-25 – 2024-11-02 (×4): qty 90, 90d supply, fill #0

## 2024-08-09 NOTE — Progress Notes (Signed)
 The patient is here today just over a year out from a left total hip replacement to treat significant left hip pain and arthritis.  He reports that he is doing well.  He does have a remote history of a right hip resurfacing arthroplasty that is having no issues.  Recently he was having a little bit of pain but that has gone away.  He is someone who does a lot of biking and hiking as well.  He is a thin individual.  He walks with a normal gait and no limp.  His ligaments are equal.  He has full and fluid range of motion of both hips.  Standing AP pelvis shows both the arthroplasties and there is no complicating features with the hardware and no evidence of osteolysis or loosening.  At this point follow-up can be as needed for his hips.  He understands if he does develop any issues not hesitate to reach out to us .

## 2024-09-06 DIAGNOSIS — L538 Other specified erythematous conditions: Secondary | ICD-10-CM | POA: Diagnosis not present

## 2024-09-06 DIAGNOSIS — L814 Other melanin hyperpigmentation: Secondary | ICD-10-CM | POA: Diagnosis not present

## 2024-09-06 DIAGNOSIS — L821 Other seborrheic keratosis: Secondary | ICD-10-CM | POA: Diagnosis not present

## 2024-09-06 DIAGNOSIS — B078 Other viral warts: Secondary | ICD-10-CM | POA: Diagnosis not present

## 2024-09-06 DIAGNOSIS — Z789 Other specified health status: Secondary | ICD-10-CM | POA: Diagnosis not present

## 2024-09-06 DIAGNOSIS — D1801 Hemangioma of skin and subcutaneous tissue: Secondary | ICD-10-CM | POA: Diagnosis not present

## 2024-09-06 DIAGNOSIS — R208 Other disturbances of skin sensation: Secondary | ICD-10-CM | POA: Diagnosis not present

## 2024-09-18 ENCOUNTER — Other Ambulatory Visit: Payer: Self-pay

## 2024-09-19 ENCOUNTER — Other Ambulatory Visit: Payer: Self-pay

## 2024-09-25 ENCOUNTER — Encounter: Payer: Self-pay | Admitting: Radiology

## 2024-09-25 ENCOUNTER — Other Ambulatory Visit (HOSPITAL_COMMUNITY): Payer: Self-pay

## 2024-10-09 ENCOUNTER — Other Ambulatory Visit: Payer: Self-pay | Admitting: Cardiology

## 2024-10-09 ENCOUNTER — Other Ambulatory Visit (HOSPITAL_COMMUNITY): Payer: Self-pay

## 2024-10-10 ENCOUNTER — Other Ambulatory Visit (HOSPITAL_COMMUNITY): Payer: Self-pay

## 2024-10-10 MED ORDER — METOPROLOL SUCCINATE ER 25 MG PO TB24
12.5000 mg | ORAL_TABLET | Freq: Every day | ORAL | 2 refills | Status: AC
  Filled 2024-10-10: qty 45, 90d supply, fill #0

## 2024-10-24 ENCOUNTER — Other Ambulatory Visit: Payer: Self-pay

## 2024-10-25 ENCOUNTER — Other Ambulatory Visit (HOSPITAL_COMMUNITY): Payer: Self-pay

## 2024-10-26 ENCOUNTER — Other Ambulatory Visit: Payer: Self-pay

## 2024-11-02 ENCOUNTER — Other Ambulatory Visit (HOSPITAL_COMMUNITY): Payer: Self-pay

## 2024-11-02 ENCOUNTER — Other Ambulatory Visit: Payer: Self-pay

## 2024-11-03 ENCOUNTER — Other Ambulatory Visit: Payer: Self-pay

## 2024-11-03 MED ORDER — COVID-19 MRNA VAC-TRIS(PFIZER) 30 MCG/0.3ML IM SUSY
0.3000 mL | PREFILLED_SYRINGE | Freq: Once | INTRAMUSCULAR | 0 refills | Status: AC
  Filled 2024-11-03: qty 0.3, 1d supply, fill #0

## 2024-11-03 MED ORDER — FLUZONE HIGH-DOSE 0.5 ML IM SUSY
0.5000 mL | PREFILLED_SYRINGE | Freq: Once | INTRAMUSCULAR | 0 refills | Status: AC
  Filled 2024-11-03: qty 0.5, 1d supply, fill #0

## 2024-12-03 ENCOUNTER — Other Ambulatory Visit (HOSPITAL_COMMUNITY): Payer: Self-pay

## 2024-12-04 ENCOUNTER — Other Ambulatory Visit (HOSPITAL_COMMUNITY): Payer: Self-pay
